# Patient Record
Sex: Male | Born: 1965 | Race: White | Hispanic: Yes | Marital: Single | Smoking: Light tobacco smoker
Health system: Southern US, Community
[De-identification: ages and names within clinical notes are randomized; demographics above are authoritative.]

## PROBLEM LIST (undated history)

## (undated) DIAGNOSIS — B20 Human immunodeficiency virus [HIV] disease: Secondary | ICD-10-CM

## (undated) DIAGNOSIS — J342 Deviated nasal septum: Secondary | ICD-10-CM

## (undated) DIAGNOSIS — E119 Type 2 diabetes mellitus without complications: Secondary | ICD-10-CM

## (undated) DIAGNOSIS — H269 Unspecified cataract: Secondary | ICD-10-CM

## (undated) HISTORY — PX: NASAL SEPTUM SURGERY: SHX37

## (undated) HISTORY — DX: Deviated nasal septum: J34.2

## (undated) HISTORY — DX: Unspecified cataract: H26.9

## (undated) HISTORY — DX: Human immunodeficiency virus (HIV) disease: B20

---

## 2014-05-15 ENCOUNTER — Telehealth: Payer: Self-pay

## 2014-05-15 NOTE — Telephone Encounter (Signed)
[facility] contacted regarding new intake appointment. Date and time given.  Patient is homeless.  Contact  Number is [facility], Luster LandsbergRenee is Case Production designer, theatre/television/filmManager.  Information given regarding documents needed to qualify for financial eligibility.  Laurell Josephsammy K Chanae Gemma, RN

## 2014-05-30 ENCOUNTER — Ambulatory Visit (INDEPENDENT_AMBULATORY_CARE_PROVIDER_SITE_OTHER): Payer: Self-pay | Admitting: Licensed Clinical Social Worker

## 2014-05-30 ENCOUNTER — Encounter: Payer: Self-pay | Admitting: *Deleted

## 2014-05-30 ENCOUNTER — Ambulatory Visit: Payer: Self-pay

## 2014-05-30 DIAGNOSIS — B20 Human immunodeficiency virus [HIV] disease: Secondary | ICD-10-CM

## 2014-05-30 DIAGNOSIS — Z23 Encounter for immunization: Secondary | ICD-10-CM

## 2014-05-30 LAB — CBC WITH DIFFERENTIAL/PLATELET
BASOS ABS: 0 10*3/uL (ref 0.0–0.1)
BASOS PCT: 0 % (ref 0–1)
EOS ABS: 0.3 10*3/uL (ref 0.0–0.7)
Eosinophils Relative: 5 % (ref 0–5)
HEMATOCRIT: 34.6 % — AB (ref 39.0–52.0)
HEMOGLOBIN: 12.1 g/dL — AB (ref 13.0–17.0)
LYMPHS PCT: 26 % (ref 12–46)
Lymphs Abs: 1.4 10*3/uL (ref 0.7–4.0)
MCH: 31.5 pg (ref 26.0–34.0)
MCHC: 35 g/dL (ref 30.0–36.0)
MCV: 90.1 fL (ref 78.0–100.0)
MONOS PCT: 16 % — AB (ref 3–12)
MPV: 9.7 fL (ref 8.6–12.4)
Monocytes Absolute: 0.9 10*3/uL (ref 0.1–1.0)
NEUTROS PCT: 53 % (ref 43–77)
Neutro Abs: 2.9 10*3/uL (ref 1.7–7.7)
PLATELETS: 260 10*3/uL (ref 150–400)
RBC: 3.84 MIL/uL — AB (ref 4.22–5.81)
RDW: 13 % (ref 11.5–15.5)
WBC: 5.4 10*3/uL (ref 4.0–10.5)

## 2014-05-30 LAB — COMPLETE METABOLIC PANEL WITH GFR
ALBUMIN: 4 g/dL (ref 3.5–5.2)
ALK PHOS: 51 U/L (ref 39–117)
ALT: 16 U/L (ref 0–53)
AST: 17 U/L (ref 0–37)
BUN: 16 mg/dL (ref 6–23)
CALCIUM: 9.1 mg/dL (ref 8.4–10.5)
CO2: 27 mEq/L (ref 19–32)
CREATININE: 0.78 mg/dL (ref 0.50–1.35)
Chloride: 102 mEq/L (ref 96–112)
GFR, Est African American: >89 mL/min
GFR, Est Non African American: >89 mL/min
Glucose, Bld: 93 mg/dL (ref 70–99)
POTASSIUM: 4.3 meq/L (ref 3.5–5.3)
Sodium: 138 mEq/L (ref 135–145)
Total Bilirubin: 0.3 mg/dL (ref 0.2–1.2)
Total Protein: 7.9 g/dL (ref 6.0–8.3)

## 2014-05-30 LAB — LIPID PANEL
CHOLESTEROL: 148 mg/dL (ref 0–200)
HDL: 34 mg/dL — AB (ref >39)
LDL Cholesterol: 87 mg/dL (ref 0–99)
TRIGLYCERIDES: 136 mg/dL (ref <150)
Total CHOL/HDL Ratio: 4.4 Ratio
VLDL: 27 mg/dL (ref 0–40)

## 2014-05-30 LAB — URINALYSIS
Bilirubin Urine: NEGATIVE
GLUCOSE, UA: NEGATIVE mg/dL
HGB URINE DIPSTICK: NEGATIVE
Ketones, ur: NEGATIVE mg/dL
Leukocytes, UA: NEGATIVE
NITRITE: NEGATIVE
PROTEIN: NEGATIVE mg/dL
Specific Gravity, Urine: 1.007 (ref 1.005–1.030)
UROBILINOGEN UA: 1 mg/dL (ref 0.0–1.0)
pH: 7 (ref 5.0–8.0)

## 2014-05-30 LAB — RPR

## 2014-05-30 LAB — HEPATITIS C ANTIBODY: HCV AB: NEGATIVE

## 2014-05-30 NOTE — Progress Notes (Signed)
New patient here today for HIV intake, he transferred from [facility] in [place]. He was diagnosed in 2008 after being hospitalized for Pneumonia. He currently is living in a homeless shelter and actively working with [facility]. Patient has been off Tivicay and Truvada since June of this year due to financial inability to obtain medications. His last visit with his ID provider was in 09/2013 CD4 155 and viral load 70. Patient states he is feeling well today, no current symptoms. Records received and vaccines updated.

## 2014-05-31 LAB — T-HELPER CELL (CD4) - (RCID CLINIC ONLY)
CD4 % Helper T Cell: 5 % — ABNORMAL LOW (ref 33–55)
CD4 T Cell Abs: 70 /uL — ABNORMAL LOW (ref 400–2700)

## 2014-05-31 LAB — URINE CYTOLOGY ANCILLARY ONLY
Chlamydia: NEGATIVE
Neisseria Gonorrhea: NEGATIVE

## 2014-06-01 LAB — HIV-1 RNA ULTRAQUANT REFLEX TO GENTYP+
HIV 1 RNA Quant: 174597 copies/mL — ABNORMAL HIGH (ref <20)
HIV-1 RNA Quant, Log: 5.24 {Log} — ABNORMAL HIGH (ref <1.30)

## 2014-06-01 LAB — QUANTIFERON TB GOLD ASSAY (BLOOD)
INTERFERON GAMMA RELEASE ASSAY: NEGATIVE
Mitogen value: >10 IU/mL
Quantiferon Nil Value: 0.05 IU/mL
Quantiferon Tb Ag Minus Nil Value: 0.01 IU/mL
TB AG VALUE: 0.06 [IU]/mL

## 2014-06-06 ENCOUNTER — Other Ambulatory Visit: Payer: Self-pay | Admitting: *Deleted

## 2014-06-06 DIAGNOSIS — B20 Human immunodeficiency virus [HIV] disease: Secondary | ICD-10-CM

## 2014-06-06 MED ORDER — EMTRICITABINE-TENOFOVIR DF 200-300 MG PO TABS: 1.0000 | ORAL_TABLET | Freq: Every day | ORAL | Status: DC

## 2014-06-06 MED ORDER — DOLUTEGRAVIR SODIUM 50 MG PO TABS: 50.0000 mg | ORAL_TABLET | Freq: Every day | ORAL | Status: DC

## 2014-06-14 LAB — HIV-1 GENOTYPR PLUS

## 2014-06-20 ENCOUNTER — Encounter: Payer: Self-pay | Admitting: Infectious Diseases

## 2014-06-20 ENCOUNTER — Telehealth: Payer: Self-pay | Admitting: *Deleted

## 2014-06-20 ENCOUNTER — Ambulatory Visit (INDEPENDENT_AMBULATORY_CARE_PROVIDER_SITE_OTHER): Payer: Self-pay | Admitting: Infectious Diseases

## 2014-06-20 VITALS — BP 127/78 | HR 81 | Temp 97.7°F | Ht 68.0 in | Wt 226.0 lb

## 2014-06-20 DIAGNOSIS — B20 Human immunodeficiency virus [HIV] disease: Secondary | ICD-10-CM

## 2014-06-20 DIAGNOSIS — E1165 Type 2 diabetes mellitus with hyperglycemia: Secondary | ICD-10-CM

## 2014-06-20 DIAGNOSIS — IMO0002 Reserved for concepts with insufficient information to code with codable children: Secondary | ICD-10-CM

## 2014-06-20 DIAGNOSIS — Z113 Encounter for screening for infections with a predominantly sexual mode of transmission: Secondary | ICD-10-CM

## 2014-06-20 MED ORDER — SULFAMETHOXAZOLE-TRIMETHOPRIM 800-160 MG PO TABS: 1.0000 | ORAL_TABLET | ORAL | Status: DC

## 2014-06-20 MED ORDER — ABACAVIR-DOLUTEGRAVIR-LAMIVUD 600-50-300 MG PO TABS: 1.0000 | ORAL_TABLET | Freq: Every day | ORAL | Status: DC

## 2014-06-20 NOTE — Assessment & Plan Note (Signed)
Will change him to triumeq. Will start him on bactrim tiw while his CD4 comes back up (was > 500 07-2013). Offered/refused condoms. Will see him back in 2-3 months.  Will get him in with [facility].

## 2014-06-20 NOTE — Assessment & Plan Note (Signed)
Will get him into [facility].

## 2014-06-20 NOTE — Progress Notes (Signed)
   Subjective:    Patient ID: Brett Walsh, male    DOB: [birth year], 49 y.o.   MRN: [id number]  HPI 49 yo M with hx of HIV+ since 2008 (when he was hospitalized with pneumonia), on DTGV/TRV (was on atripla prior, developed resistance), also hx of DM2 (2012).  Has been off insulin/glycemic/htn-sive meds and ART recently. [facility]   HIV 1 RNA QUANT (copies/mL)  Date Value  05/30/2014 174597*   CD4 T CELL ABS (/uL)  Date Value  05/30/2014 70*   Has been feeling well.   Review of Systems  Constitutional: Negative for fever, chills, appetite change and unexpected weight change.  Eyes: Negative for visual disturbance.  Gastrointestinal: Negative for diarrhea and constipation.  Genitourinary: Negative for difficulty urinating.  Neurological: Negative for numbness.       Objective:   Physical Exam  Constitutional: He appears well-developed and well-nourished.  HENT:  Mouth/Throat: No oropharyngeal exudate.  Eyes: EOM are normal. Pupils are equal, round, and reactive to light.  Neck: Neck supple.  Cardiovascular: Normal rate, regular rhythm and normal heart sounds.   Pulmonary/Chest: Effort normal and breath sounds normal.  Abdominal: Soft. Bowel sounds are normal. There is no tenderness. There is no rebound.  Musculoskeletal: He exhibits no edema.  No diabetic foot lesions. Normal light touch.   Lymphadenopathy:    He has no cervical adenopathy.          Assessment & Plan:

## 2014-06-20 NOTE — Telephone Encounter (Signed)
Andree CossHowell, Tadan Shill M, RN

## 2014-06-21 NOTE — Telephone Encounter (Signed)
Spoke with Luster Landsbergenee, [facility], regarding message left 1/19 about patient.  Patient's application for ADAP is not yet mailed (she will get his signature, will ask for this to be rushed due to his CD4 = 70).  She [facility].  Will look into financial assistance for prophylaxis bactrim. Andree CossHowell, Catia Todorov M, RN

## 2014-07-10 ENCOUNTER — Ambulatory Visit (INDEPENDENT_AMBULATORY_CARE_PROVIDER_SITE_OTHER): Payer: Self-pay | Admitting: Infectious Diseases

## 2014-07-10 ENCOUNTER — Encounter: Payer: Self-pay | Admitting: Infectious Diseases

## 2014-07-10 VITALS — BP 144/89 | HR 97 | Temp 98.1°F | Wt 227.0 lb

## 2014-07-10 DIAGNOSIS — B370 Candidal stomatitis: Secondary | ICD-10-CM | POA: Insufficient documentation

## 2014-07-10 DIAGNOSIS — B20 Human immunodeficiency virus [HIV] disease: Secondary | ICD-10-CM

## 2014-07-10 MED ORDER — FLUCONAZOLE 100 MG PO TABS: 100.0000 mg | ORAL_TABLET | Freq: Every day | ORAL | Status: DC

## 2014-07-10 MED ORDER — SULFAMETHOXAZOLE-TRIMETHOPRIM 800-160 MG PO TABS: 1.0000 | ORAL_TABLET | ORAL | Status: DC

## 2014-07-10 NOTE — Assessment & Plan Note (Signed)
He's at high risk given his DM, HIV.  Will write him rx for diflucan, my great appreciation to [facility] for their assistance in helping him get his meds.

## 2014-07-10 NOTE — Addendum Note (Signed)
Addended by: Andree CossHOWELL, Briel Gallicchio M on: 07/10/2014 03:34 PM   Modules accepted: Orders

## 2014-07-10 NOTE — Progress Notes (Signed)
   Subjective:    Patient ID: Brett MarionEverardo Walsh, male    DOB: 1966/02/28, 49 y.o.   MRN: [id number]  HPI 49 yo M with hx of HIV+ since 2008 (when he was hospitalized with pneumonia), on DTGV/TRV (was on atripla prior, developed resistance), also hx of DM2 (2012).  Has been off insulin/glycemic/htn-sive meds and ART recently He was seen in f/u in ID clinic on 1-19 and started on triumeq and bactrim.  Has developed white areas on his mouth and also a white substance on his groin/foreskin. Has had dysphagia. Has not gotten his ART yet, getting help from [facility].    Review of Systems     Objective:   Physical Exam  Constitutional: He appears well-developed and well-nourished.  HENT:  Mouth/Throat: No oropharyngeal exudate.  Eyes: EOM are normal. Pupils are equal, round, and reactive to light.  Neck: Neck supple.  Cardiovascular: Normal rate, regular rhythm and normal heart sounds.   Pulmonary/Chest: Effort normal and breath sounds normal.  Abdominal: Soft. Bowel sounds are normal. He exhibits no distension. There is no tenderness.  Lymphadenopathy:    He has no cervical adenopathy.          Assessment & Plan:

## 2014-07-10 NOTE — Assessment & Plan Note (Addendum)
My great appreciation to [facility] for their assistance in helping him get his meds.  He denies that he is depressed, has had some down time. Overall looking up, has had 3 job interviews.

## 2014-07-20 ENCOUNTER — Telehealth: Payer: Self-pay | Admitting: *Deleted

## 2014-07-20 NOTE — Telephone Encounter (Signed)
Patient came in with his lab work that was drawn at work and had questions. Explained to him about the different cholesterol levels and advised him that his glucose was slightly elevated at 110. Dr. Ninetta LightsHatcher does want him to establish with PCP and gave [facility]. A copy of his labs was put in Dr. Moshe CiproHatcher's box.

## 2014-08-10 ENCOUNTER — Telehealth: Payer: Self-pay | Admitting: *Deleted

## 2014-08-10 NOTE — Telephone Encounter (Signed)
Provided information for pt with constipation, ie., increase daily fluid intake, increase roughage in diet (pt could name products), and discuss stool softeners with pharmacist at pharmacy.  Pt verbalized understanding.

## 2014-08-15 ENCOUNTER — Telehealth: Payer: Self-pay | Admitting: Licensed Clinical Social Worker

## 2014-08-15 NOTE — Telephone Encounter (Signed)
Patient has concerns of a blotchy red rash on his arms and hands, it does not appear on the covered parts of his body.  Patient was wondering if it could come from his medication Triumeq. Spoke with Dr. Orvan Falconerampbell it is unlikely that it is coming from that. Patient called back stating that it could be sun sensitivity  from Bactrim that he takes daily. Patient has an appointment on tomorrow, Micael HampshireMichelle Howell, RN advised patient  sun protection on the skin and discuss with Dr. Ninetta LightsHatcher tomorrow.

## 2014-08-16 ENCOUNTER — Ambulatory Visit (INDEPENDENT_AMBULATORY_CARE_PROVIDER_SITE_OTHER): Payer: Self-pay | Admitting: Infectious Diseases

## 2014-08-16 ENCOUNTER — Encounter: Payer: Self-pay | Admitting: Infectious Diseases

## 2014-08-16 VITALS — BP 137/83 | HR 78 | Temp 98.0°F | Wt 230.5 lb

## 2014-08-16 DIAGNOSIS — K5909 Other constipation: Secondary | ICD-10-CM

## 2014-08-16 DIAGNOSIS — K5901 Slow transit constipation: Secondary | ICD-10-CM | POA: Insufficient documentation

## 2014-08-16 DIAGNOSIS — E1165 Type 2 diabetes mellitus with hyperglycemia: Secondary | ICD-10-CM

## 2014-08-16 DIAGNOSIS — IMO0002 Reserved for concepts with insufficient information to code with codable children: Secondary | ICD-10-CM

## 2014-08-16 DIAGNOSIS — B20 Human immunodeficiency virus [HIV] disease: Secondary | ICD-10-CM

## 2014-08-16 NOTE — Addendum Note (Signed)
Addended by: Jennet MaduroESTRIDGE, Dealie Koelzer D on: 08/16/2014 10:25 AM   Modules accepted: Orders

## 2014-08-16 NOTE — Assessment & Plan Note (Signed)
Will get him in with IM clinic.  No vision change.

## 2014-08-16 NOTE — Progress Notes (Signed)
   Subjective:    Patient ID: Brett MarionEverardo Walsh, male    DOB: May 07, 1966, 49 y.o.   MRN: [id number]  HPI 49 yo M with hx of HIV+ since 2008 (when he was hospitalized with pneumonia), on DTGV/TRV (was on atripla prior, developed resistance), also hx of DM2 (2012).  Has been off insulin/glycemic/htn-sive meds and ART recently He was seen in f/u in ID clinic on 1-19 and started on triumeq and bactrim.  Has been on triumeq for 2 weeks.  Has been having skin reaction to sun over the past week.  Does not have DM f/u yet.   HIV 1 RNA QUANT (copies/mL)  Date Value  05/30/2014 174597*   CD4 T CELL ABS (/uL)  Date Value  05/30/2014 70*   hsa tried stool softeners for constipation, has had incomplete relief.  Wt increasing  Review of Systems  Constitutional: Negative for unexpected weight change.  Cardiovascular: Negative for leg swelling.  Gastrointestinal: Positive for constipation. Negative for diarrhea.  Genitourinary: Negative for difficulty urinating.       Objective:   Physical Exam  Constitutional: He appears well-developed and well-nourished.  HENT:  Mouth/Throat: No oropharyngeal exudate.  Eyes: EOM are normal. Pupils are equal, round, and reactive to light.  Neck: Neck supple.  Cardiovascular: Normal rate, regular rhythm and normal heart sounds.   Pulmonary/Chest: Effort normal and breath sounds normal.  Abdominal: Soft. Bowel sounds are normal. He exhibits no distension. There is no tenderness.  Musculoskeletal: He exhibits no edema.  Lymphadenopathy:    He has no cervical adenopathy.       Assessment & Plan:

## 2014-08-16 NOTE — Assessment & Plan Note (Signed)
Suggested he try MgCitrate qday x 2.  Continue stool softener.

## 2014-08-16 NOTE — Assessment & Plan Note (Signed)
He appears to be doing well.  Sun reaction due to bactrim. Hopefully can stop this in the next month.  Offered/refused condoms.  rtc 1 month.  vax up to date.

## 2014-08-29 ENCOUNTER — Encounter: Payer: Self-pay | Admitting: Infectious Diseases

## 2014-08-31 ENCOUNTER — Encounter: Payer: Self-pay | Admitting: Infectious Diseases

## 2014-09-04 ENCOUNTER — Encounter: Payer: Self-pay | Admitting: Infectious Diseases

## 2014-09-09 ENCOUNTER — Encounter: Payer: Self-pay | Admitting: Infectious Diseases

## 2014-09-11 ENCOUNTER — Other Ambulatory Visit: Payer: 59

## 2014-09-11 DIAGNOSIS — B20 Human immunodeficiency virus [HIV] disease: Secondary | ICD-10-CM

## 2014-09-11 LAB — COMPLETE METABOLIC PANEL WITH GFR
ALBUMIN: 4.1 g/dL (ref 3.5–5.2)
ALT: 22 U/L (ref 0–53)
AST: 18 U/L (ref 0–37)
Alkaline Phosphatase: 44 U/L (ref 39–117)
BUN: 13 mg/dL (ref 6–23)
CHLORIDE: 102 meq/L (ref 96–112)
CO2: 23 mEq/L (ref 19–32)
Calcium: 8.8 mg/dL (ref 8.4–10.5)
Creat: 0.89 mg/dL (ref 0.50–1.35)
GFR, Est African American: >89 mL/min
GFR, Est Non African American: >89 mL/min
Glucose, Bld: 92 mg/dL (ref 70–99)
POTASSIUM: 3.8 meq/L (ref 3.5–5.3)
Sodium: 138 mEq/L (ref 135–145)
TOTAL PROTEIN: 8 g/dL (ref 6.0–8.3)
Total Bilirubin: 0.5 mg/dL (ref 0.2–1.2)

## 2014-09-11 LAB — CBC
HCT: 37 % — ABNORMAL LOW (ref 39.0–52.0)
Hemoglobin: 13 g/dL (ref 13.0–17.0)
MCH: 32.5 pg (ref 26.0–34.0)
MCHC: 35.1 g/dL (ref 30.0–36.0)
MCV: 92.5 fL (ref 78.0–100.0)
MPV: 9.9 fL (ref 8.6–12.4)
PLATELETS: 246 10*3/uL (ref 150–400)
RBC: 4 MIL/uL — ABNORMAL LOW (ref 4.22–5.81)
RDW: 16.8 % — ABNORMAL HIGH (ref 11.5–15.5)
WBC: 7.3 10*3/uL (ref 4.0–10.5)

## 2014-09-12 LAB — T-HELPER CELL (CD4) - (RCID CLINIC ONLY)
CD4 % Helper T Cell: 6 % — ABNORMAL LOW (ref 33–55)
CD4 T Cell Abs: 160 /uL — ABNORMAL LOW (ref 400–2700)

## 2014-09-12 LAB — HEPATITIS PANEL, ACUTE
HCV AB: NEGATIVE
Hep A IgM: NONREACTIVE
Hep B C IgM: NONREACTIVE
Hepatitis B Surface Ag: NEGATIVE

## 2014-09-12 LAB — HIV-1 RNA QUANT-NO REFLEX-BLD
HIV 1 RNA Quant: 117 copies/mL — ABNORMAL HIGH (ref <20)
HIV-1 RNA Quant, Log: 2.07 {Log} — ABNORMAL HIGH (ref <1.30)

## 2014-09-20 ENCOUNTER — Ambulatory Visit (INDEPENDENT_AMBULATORY_CARE_PROVIDER_SITE_OTHER): Payer: 59 | Admitting: Infectious Diseases

## 2014-09-20 ENCOUNTER — Encounter: Payer: Self-pay | Admitting: Infectious Diseases

## 2014-09-20 VITALS — BP 144/81 | HR 96 | Temp 97.9°F | Wt 236.0 lb

## 2014-09-20 DIAGNOSIS — B370 Candidal stomatitis: Secondary | ICD-10-CM | POA: Diagnosis not present

## 2014-09-20 DIAGNOSIS — IMO0002 Reserved for concepts with insufficient information to code with codable children: Secondary | ICD-10-CM

## 2014-09-20 DIAGNOSIS — B20 Human immunodeficiency virus [HIV] disease: Secondary | ICD-10-CM

## 2014-09-20 DIAGNOSIS — E1165 Type 2 diabetes mellitus with hyperglycemia: Secondary | ICD-10-CM | POA: Diagnosis not present

## 2014-09-20 DIAGNOSIS — Z113 Encounter for screening for infections with a predominantly sexual mode of transmission: Secondary | ICD-10-CM

## 2014-09-20 DIAGNOSIS — Z79899 Other long term (current) drug therapy: Secondary | ICD-10-CM

## 2014-09-20 MED ORDER — FLUCONAZOLE 100 MG PO TABS: 100.0000 mg | ORAL_TABLET | Freq: Every day | ORAL | Status: DC

## 2014-09-20 NOTE — Assessment & Plan Note (Signed)
He is doing well.  Will cont triumeq.  Needs Hep A/B. Offered/refused condoms.

## 2014-09-20 NOTE — Assessment & Plan Note (Signed)
Will refill his fluconazole.  

## 2014-09-20 NOTE — Progress Notes (Signed)
   Subjective:    Patient ID: Brett Walsh, male    DOB: Oct 11, 1965, 49 y.o.   MRN: [id number]  HPI 49 yo M with hx of HIV+ since 2008 (when he was hospitalized with pneumonia), on DTGV/TRV (was on atripla prior, developed resistance), also hx of DM2 (2012).  Has been off insulin/glycemic/htn-sive meds and ART recently He was seen in f/u in ID clinic on 1-19 and started on triumeq and bactrim.  He was then seen in f/u and was felt to be having sun reaction to bactrim.  Is worried he may have thrush again. Has had for last 5 days. Feels like he has a film/coating in his mouth.   HIV 1 RNA QUANT (copies/mL)  Date Value  09/11/2014 117*  05/30/2014 174597*   CD4 T CELL ABS (/uL)  Date Value  09/11/2014 160*  05/30/2014 70*   Has been walking fo exercise.  Chol 158, Trig 99 (07-2014)  Review of Systems  Constitutional: Negative for appetite change and unexpected weight change.  Gastrointestinal: Negative for diarrhea and constipation.  Genitourinary: Negative for difficulty urinating.      Objective:   Physical Exam  Constitutional: He appears well-developed and well-nourished.  HENT:  Mouth/Throat: No oropharyngeal exudate.  Eyes: EOM are normal. Pupils are equal, round, and reactive to light.  Neck: Neck supple.  Cardiovascular: Normal rate, regular rhythm and normal heart sounds.   Pulmonary/Chest: Effort normal and breath sounds normal.  Abdominal: Soft. Bowel sounds are normal. He exhibits no distension. There is no tenderness.  Lymphadenopathy:    He has no cervical adenopathy.      Assessment & Plan:

## 2014-09-20 NOTE — Assessment & Plan Note (Signed)
[facility] is [facility] get [facility].  Will keep trying.

## 2014-09-21 ENCOUNTER — Encounter: Payer: Self-pay | Admitting: Infectious Diseases

## 2014-10-20 ENCOUNTER — Ambulatory Visit (INDEPENDENT_AMBULATORY_CARE_PROVIDER_SITE_OTHER): Payer: 59 | Admitting: *Deleted

## 2014-10-20 DIAGNOSIS — Z23 Encounter for immunization: Secondary | ICD-10-CM

## 2014-10-20 DIAGNOSIS — B20 Human immunodeficiency virus [HIV] disease: Secondary | ICD-10-CM

## 2014-10-28 ENCOUNTER — Other Ambulatory Visit: Payer: Self-pay | Admitting: Infectious Diseases

## 2014-10-28 DIAGNOSIS — B20 Human immunodeficiency virus [HIV] disease: Secondary | ICD-10-CM

## 2014-12-11 ENCOUNTER — Other Ambulatory Visit: Payer: 59

## 2014-12-11 DIAGNOSIS — E1165 Type 2 diabetes mellitus with hyperglycemia: Secondary | ICD-10-CM

## 2014-12-11 DIAGNOSIS — Z113 Encounter for screening for infections with a predominantly sexual mode of transmission: Secondary | ICD-10-CM

## 2014-12-11 DIAGNOSIS — IMO0002 Reserved for concepts with insufficient information to code with codable children: Secondary | ICD-10-CM

## 2014-12-11 DIAGNOSIS — B20 Human immunodeficiency virus [HIV] disease: Secondary | ICD-10-CM

## 2014-12-11 LAB — BASIC METABOLIC PANEL
BUN: 17 mg/dL (ref 6–23)
CALCIUM: 9.1 mg/dL (ref 8.4–10.5)
CO2: 20 mEq/L (ref 19–32)
Chloride: 100 mEq/L (ref 96–112)
Creat: 1.05 mg/dL (ref 0.50–1.35)
Glucose, Bld: 196 mg/dL — ABNORMAL HIGH (ref 70–99)
Potassium: 4.2 mEq/L (ref 3.5–5.3)
Sodium: 134 mEq/L — ABNORMAL LOW (ref 135–145)

## 2014-12-11 LAB — LIPID PANEL
Cholesterol: 180 mg/dL (ref 0–200)
HDL: 34 mg/dL — ABNORMAL LOW (ref >=40)
LDL CALC: 119 mg/dL — AB (ref 0–99)
TRIGLYCERIDES: 136 mg/dL (ref <150)
Total CHOL/HDL Ratio: 5.3 Ratio
VLDL: 27 mg/dL (ref 0–40)

## 2014-12-11 LAB — HEMOGLOBIN A1C
Hgb A1c MFr Bld: 7.9 % — ABNORMAL HIGH (ref <5.7)
MEAN PLASMA GLUCOSE: 180 mg/dL — AB (ref <117)

## 2014-12-11 LAB — RPR

## 2014-12-12 LAB — T-HELPER CELL (CD4) - (RCID CLINIC ONLY)
CD4 T CELL HELPER: 8 % — AB (ref 33–55)
CD4 T Cell Abs: 280 /uL — ABNORMAL LOW (ref 400–2700)

## 2014-12-12 LAB — HIV-1 RNA QUANT-NO REFLEX-BLD: HIV-1 RNA Quant, Log: <1.3 {Log} (ref <1.30)

## 2014-12-25 ENCOUNTER — Telehealth: Payer: Self-pay | Admitting: *Deleted

## 2014-12-25 ENCOUNTER — Encounter: Payer: Self-pay | Admitting: Infectious Diseases

## 2014-12-25 ENCOUNTER — Ambulatory Visit (INDEPENDENT_AMBULATORY_CARE_PROVIDER_SITE_OTHER): Payer: 59 | Admitting: Infectious Diseases

## 2014-12-25 VITALS — BP 138/83 | HR 72 | Temp 97.9°F | Ht 67.0 in | Wt 235.0 lb

## 2014-12-25 DIAGNOSIS — IMO0002 Reserved for concepts with insufficient information to code with codable children: Secondary | ICD-10-CM

## 2014-12-25 DIAGNOSIS — Z79899 Other long term (current) drug therapy: Secondary | ICD-10-CM | POA: Diagnosis not present

## 2014-12-25 DIAGNOSIS — Z113 Encounter for screening for infections with a predominantly sexual mode of transmission: Secondary | ICD-10-CM

## 2014-12-25 DIAGNOSIS — B20 Human immunodeficiency virus [HIV] disease: Secondary | ICD-10-CM | POA: Diagnosis not present

## 2014-12-25 DIAGNOSIS — E1165 Type 2 diabetes mellitus with hyperglycemia: Secondary | ICD-10-CM | POA: Diagnosis not present

## 2014-12-25 NOTE — Assessment & Plan Note (Addendum)
He is doing well on his current art.  Offered/refused condoms. Not sexually active. Will stop bactrim. Will see him back in 6 months.

## 2014-12-25 NOTE — Telephone Encounter (Signed)
RN requested referral to [facility] [phone]) for PCP care.  Their referral coordinator, Annice Pih, will review this and contact the patient. RN contacted Dr. Ephriam Knuckles office [phone]), he is scheduled for 9/6 8:00 new patient appointment (pt should be off probation at work by this appointment, will be able to take off of work).  Referral faxed to [phone].   RN left message with this information on the patient's voice mail. Andree Coss, RN

## 2014-12-25 NOTE — Progress Notes (Addendum)
Patient ID: Brett Walsh, male   DOB: 02/03/1966, 49 y.o.   MRN: [id number] [facility] for referral.  This will be passed to [facility], their referral coordinator for new patients.  No need to send EPIC referral. Called [facility], Dr. Fawn Kirk for ophthalmology referral.  He is scheduled for Tuesday September 6 at 8:00.  Patient notified, referral information faxed. Andree Coss, RN

## 2014-12-25 NOTE — Assessment & Plan Note (Signed)
Will get him in with PCP, ophtho.

## 2014-12-25 NOTE — Progress Notes (Signed)
   Subjective:    Patient ID: Brett Walsh, male    DOB: 03/02/1966, 49 y.o.   MRN: [id number]  HPI 49 yo M with hx of HIV+ since 2008 (when he was hospitalized with pneumonia), on DTGV/TRV (was on atripla prior, developed resistance), also hx of DM2 (2012).  He was seen in f/u in ID clinic on 1-19 and started on triumeq and bactrim.  He was then seen in f/u and was felt to be having sun reaction to bactrim.  Has had difficulty finding f/u for his DM.  Has had no problems with triumeq.   HIV 1 RNA QUANT (copies/mL)  Date Value  12/11/2014 <20  09/11/2014 117*  05/30/2014 174597*   CD4 T CELL ABS (/uL)  Date Value  12/11/2014 280*  09/11/2014 160*  05/30/2014 70*   No ophtho this yr. Aware he needs to lose wt.    Review of Systems  Constitutional: Negative for appetite change and unexpected weight change.  Eyes: Negative for visual disturbance.  Gastrointestinal: Negative for diarrhea and constipation.  Genitourinary: Negative for difficulty urinating.  Neurological: Negative for numbness.       Objective:   Physical Exam  Constitutional: He appears well-developed and well-nourished.  HENT:  Mouth/Throat: No oropharyngeal exudate.  Eyes: EOM are normal. Pupils are equal, round, and reactive to light.  Neck: Neck supple.  Cardiovascular: Normal rate, regular rhythm and normal heart sounds.   Pulmonary/Chest: Effort normal and breath sounds normal.  Abdominal: Soft. Bowel sounds are normal. There is no tenderness. There is no rebound.  Musculoskeletal:       Feet:  Lymphadenopathy:    He has no cervical adenopathy.      Assessment & Plan:

## 2014-12-25 NOTE — Addendum Note (Signed)
Addended by: Andree Coss on: 12/25/2014 01:00 PM   Modules accepted: Medications

## 2015-01-09 ENCOUNTER — Encounter: Payer: Self-pay | Admitting: Infectious Diseases

## 2015-01-22 ENCOUNTER — Encounter: Payer: Self-pay | Admitting: Infectious Diseases

## 2015-01-22 ENCOUNTER — Telehealth: Payer: Self-pay | Admitting: *Deleted

## 2015-01-22 NOTE — Telephone Encounter (Signed)
The patient called and advised he is having trouble with his genatils and wants to get a refill on his medications for it. Asked him what medication and he could not tell me. Advised him because we can not diagnose this over the phone and he could not tell me what medication he used he will need an appt. Advised there is nothing oh his active med list and it could be something different. Gave him an appt with Dr Drue Second for 01/23/15 at 10 am.

## 2015-01-23 ENCOUNTER — Encounter: Payer: Self-pay | Admitting: Internal Medicine

## 2015-01-23 ENCOUNTER — Ambulatory Visit (INDEPENDENT_AMBULATORY_CARE_PROVIDER_SITE_OTHER): Payer: 59 | Admitting: Internal Medicine

## 2015-01-23 VITALS — BP 142/88 | HR 68 | Temp 97.7°F | Wt 231.0 lb

## 2015-01-23 DIAGNOSIS — B20 Human immunodeficiency virus [HIV] disease: Secondary | ICD-10-CM | POA: Diagnosis not present

## 2015-01-23 DIAGNOSIS — B3742 Candidal balanitis: Secondary | ICD-10-CM | POA: Diagnosis not present

## 2015-01-23 DIAGNOSIS — E1165 Type 2 diabetes mellitus with hyperglycemia: Secondary | ICD-10-CM | POA: Diagnosis not present

## 2015-01-23 DIAGNOSIS — IMO0002 Reserved for concepts with insufficient information to code with codable children: Secondary | ICD-10-CM

## 2015-01-23 MED ORDER — MICONAZOLE NITRATE 2 % EX CREA: 1.0000 "application " | TOPICAL_CREAM | Freq: Two times a day (BID) | CUTANEOUS | Status: DC

## 2015-01-23 NOTE — Progress Notes (Signed)
Patient ID: Brett Walsh, male   DOB: [birth year], 49 y.o.   MRN: [id number]       Patient ID: Brett Walsh, male   DOB: January 06, 1966, 49 y.o.   MRN: [id number]  HPI 49yo M with HIV disease, CD 4 count of 280/VL<20 on triomeq. He also has diabetes under good control with metformin and canagliflozin with hemoglobin a1c of 7. He noticed whitish exudate on head of penis. Also has fissure on shaft. No sex or masturbation. Started 7 days ago worsened over the weekend. Has not tried any topical medicaoitn  Outpatient Encounter Prescriptions as of 01/23/2015  Medication Sig  . Abacavir-Dolutegravir-Lamivud 600-50-300 MG TABS Take 1 tablet by mouth daily.  . cetirizine (ZYRTEC) 10 MG tablet Take 10 mg by mouth daily.  . metFORMIN (GLUCOPHAGE) 500 MG tablet Take 500 mg by mouth daily.  . canagliflozin (INVOKANA) 100 MG TABS tablet Take 100 mg by mouth.   No facility-administered encounter medications on file as of 01/23/2015.     Patient Active Problem List   Diagnosis Date Noted  . HIV disease 06/20/2014  . Diabetes type 2, uncontrolled 06/20/2014     Health Maintenance Due  Topic Date Due  . FOOT EXAM  12/21/1977  . OPHTHALMOLOGY EXAM  12/21/1977  . URINE MICROALBUMIN  12/21/1977  . INFLUENZA VACCINE  01/01/2015     Review of Systems  Physical Exam   BP 142/88 mmHg  Pulse 68  Temp(Src) 97.7 F (36.5 C) (Oral)  Wt 231 lb (104.781 kg) Gen= a x o by 3 in nad gu  = whitish exudate to folds of skin of penis shaft/head of penis. No ulcers, no penile drainage  Lab Results  Component Value Date   CD4TCELL 8* 12/11/2014   Lab Results  Component Value Date   CD4TABS 280* 12/11/2014   CD4TABS 160* 09/11/2014   CD4TABS 70* 05/30/2014   Lab Results  Component Value Date   HIV1RNAQUANT <20 12/11/2014   No results found for: HEPBSAB No results found for: RPR  CBC Lab Results  Component Value Date   WBC 7.3 09/11/2014   RBC 4.00* 09/11/2014   HGB 13.0 09/11/2014   HCT 37.0*  09/11/2014   PLT 246 09/11/2014   MCV 92.5 09/11/2014   MCH 32.5 09/11/2014   MCHC 35.1 09/11/2014   RDW 16.8* 09/11/2014   LYMPHSABS 1.4 05/30/2014   MONOABS 0.9 05/30/2014   EOSABS 0.3 05/30/2014   BASOSABS 0.0 05/30/2014   BMET Lab Results  Component Value Date   NA 134* 12/11/2014   K 4.2 12/11/2014   CL 100 12/11/2014   CO2 20 12/11/2014   GLUCOSE 196* 12/11/2014   BUN 17 12/11/2014   CREATININE 1.05 12/11/2014   CALCIUM 9.1 12/11/2014   GFRNONAA >89 09/11/2014   GFRAA >89 09/11/2014     Assessment and Plan  hiv disease = continue on triumeq  Dm 2, uncontrolled = improving, appears hemoglobin a1c at 7. Continue with current regimen and follow up with pcp.  Candidal balantitis = miconazole 2% cream bid, if not improved can switch to fluconazole  po x 7 d

## 2015-02-06 LAB — HM DIABETES EYE EXAM

## 2015-06-12 ENCOUNTER — Encounter: Payer: Self-pay | Admitting: Infectious Diseases

## 2015-06-12 ENCOUNTER — Other Ambulatory Visit: Payer: 59

## 2015-06-12 DIAGNOSIS — Z79899 Other long term (current) drug therapy: Secondary | ICD-10-CM

## 2015-06-12 DIAGNOSIS — B20 Human immunodeficiency virus [HIV] disease: Secondary | ICD-10-CM

## 2015-06-12 DIAGNOSIS — Z113 Encounter for screening for infections with a predominantly sexual mode of transmission: Secondary | ICD-10-CM

## 2015-06-12 LAB — COMPREHENSIVE METABOLIC PANEL
ALK PHOS: 55 U/L (ref 40–115)
ALT: 7 U/L — AB (ref 9–46)
AST: 8 U/L — AB (ref 10–40)
Albumin: 4 g/dL (ref 3.6–5.1)
BILIRUBIN TOTAL: 0.4 mg/dL (ref 0.2–1.2)
BUN: 16 mg/dL (ref 7–25)
CO2: 21 mmol/L (ref 20–31)
CREATININE: 0.82 mg/dL (ref 0.60–1.35)
Calcium: 9.1 mg/dL (ref 8.6–10.3)
Chloride: 98 mmol/L (ref 98–110)
GLUCOSE: 511 mg/dL — AB (ref 65–99)
Potassium: 4.6 mmol/L (ref 3.5–5.3)
SODIUM: 134 mmol/L — AB (ref 135–146)
TOTAL PROTEIN: 7.3 g/dL (ref 6.1–8.1)

## 2015-06-12 LAB — CBC
HCT: 38.5 % — ABNORMAL LOW (ref 39.0–52.0)
Hemoglobin: 13.1 g/dL (ref 13.0–17.0)
MCH: 33 pg (ref 26.0–34.0)
MCHC: 34 g/dL (ref 30.0–36.0)
MCV: 97 fL (ref 78.0–100.0)
MPV: 10.6 fL (ref 8.6–12.4)
PLATELETS: 268 10*3/uL (ref 150–400)
RBC: 3.97 MIL/uL — AB (ref 4.22–5.81)
RDW: 13.7 % (ref 11.5–15.5)
WBC: 8.5 10*3/uL (ref 4.0–10.5)

## 2015-06-12 LAB — LIPID PANEL
CHOLESTEROL: 179 mg/dL (ref 125–200)
HDL: 38 mg/dL — ABNORMAL LOW (ref >=40)
LDL Cholesterol: 112 mg/dL (ref <130)
Total CHOL/HDL Ratio: 4.7 Ratio (ref <=5.0)
Triglycerides: 147 mg/dL (ref <150)
VLDL: 29 mg/dL (ref <30)

## 2015-06-12 NOTE — Addendum Note (Signed)
Addended byJimmy Picket: ABBITT, KATRINA F on: 06/12/2015 11:34 AM   Modules accepted: Orders

## 2015-06-13 ENCOUNTER — Telehealth: Payer: Self-pay | Admitting: *Deleted

## 2015-06-13 LAB — RPR

## 2015-06-13 LAB — URINE CYTOLOGY ANCILLARY ONLY
Chlamydia: NEGATIVE
NEISSERIA GONORRHEA: NEGATIVE

## 2015-06-13 LAB — T-HELPER CELL (CD4) - (RCID CLINIC ONLY)
CD4 T CELL HELPER: 7 % — AB (ref 33–55)
CD4 T Cell Abs: 150 /uL — ABNORMAL LOW (ref 400–2700)

## 2015-06-13 NOTE — Telephone Encounter (Signed)
-----   Message from Ginnie SmartJeffrey C Hatcher, MD sent at 06/13/2015  8:42 AM EST ----- Please call pt and have him see PCP ASAP  ----- Message -----    From: Lab in [facility] Interface    Sent: 06/12/2015   6:16 PM      To: Ginnie SmartJeffrey C Hatcher, MD

## 2015-06-13 NOTE — Telephone Encounter (Signed)
Left message for patient then contacted patient's PCP to make him aware. Faxed results to Dr. Quita SkyeMitchell's attention. Patient then called back, stated he feels fine, he was not fasting for the labs.  RN advise that the patient needed to see Dr. Clovis RileyMitchell for potential medication adjustment. He is contacting Dr. Quita SkyeMitchell's office today. Andree CossHowell, Jasun Gasparini M, RN

## 2015-06-14 LAB — HIV-1 RNA QUANT-NO REFLEX-BLD
HIV 1 RNA Quant: 27 copies/mL — ABNORMAL HIGH (ref <20)
HIV-1 RNA QUANT, LOG: 1.43 {Log_copies}/mL — AB (ref <1.30)

## 2015-06-26 ENCOUNTER — Ambulatory Visit: Payer: 59 | Admitting: Infectious Diseases

## 2015-07-11 ENCOUNTER — Ambulatory Visit: Payer: 59 | Admitting: Infectious Diseases

## 2015-07-12 ENCOUNTER — Encounter: Payer: Self-pay | Admitting: Infectious Diseases

## 2015-07-12 ENCOUNTER — Ambulatory Visit (INDEPENDENT_AMBULATORY_CARE_PROVIDER_SITE_OTHER): Payer: 59 | Admitting: Infectious Diseases

## 2015-07-12 VITALS — BP 123/78 | HR 87 | Temp 97.4°F | Wt 207.0 lb

## 2015-07-12 DIAGNOSIS — Z23 Encounter for immunization: Secondary | ICD-10-CM | POA: Diagnosis not present

## 2015-07-12 DIAGNOSIS — B20 Human immunodeficiency virus [HIV] disease: Secondary | ICD-10-CM | POA: Diagnosis not present

## 2015-07-12 NOTE — Assessment & Plan Note (Addendum)
Has been improving.  Consider adding ACE-I? Has had f/u with PCP.

## 2015-07-12 NOTE — Addendum Note (Signed)
Addended by: Wendall Mola A on: 07/12/2015 03:34 PM   Modules accepted: Orders

## 2015-07-12 NOTE — Assessment & Plan Note (Signed)
will continue on triumeq Offered/refused condoms.  Will complete hep A/B today Asked him to see foot doctor.  Will recheck his CD4 and VL today.  rtc in 6 months if better.

## 2015-07-12 NOTE — Progress Notes (Signed)
   Subjective:    Patient ID: Brett Walsh, male    DOB: Oct 05, 1965, 50 y.o.   MRN: [id number]  HPI 50 yo M with hx of HIV+ since 2008 (when he was hospitalized with pneumonia), on DTGV/TRV (was on atripla prior, developed resistance), also hx of DM2 (2012).  He was seen in f/u in ID clinic on 1-19 and started on triumeq. He has been changed from metformin to insulin recently. Has been having some headaches with this.   HIV 1 RNA QUANT (copies/mL)  Date Value  06/12/2015 27*  12/11/2014 <20  09/11/2014 117*   CD4 T CELL ABS (/uL)  Date Value  06/12/2015 150*  12/11/2014 280*  09/11/2014 160*   Having small BM daily. Feels like he has incomplete evacuation. Does not feel constipated. Drinks ~ 2 gal/day. Has tried Mag Citrate prior with good result.   Review of Systems  Constitutional: Negative for appetite change and unexpected weight change.  Eyes: Negative for visual disturbance.  Gastrointestinal: Positive for constipation. Negative for diarrhea.  Genitourinary: Negative for difficulty urinating.  Neurological: Positive for numbness.   Numbness in R hand from pinched nerve in neck.  Please see HPI. 12 point ROS o/w (-)     Objective:   Physical Exam  Constitutional: He appears well-developed and well-nourished.  HENT:  Mouth/Throat: No oropharyngeal exudate.  Eyes: EOM are normal. Pupils are equal, round, and reactive to light.  Neck: Neck supple.  Cardiovascular: Normal rate, regular rhythm and normal heart sounds.   Pulmonary/Chest: Effort normal and breath sounds normal.  Abdominal: Soft. Bowel sounds are normal. There is no tenderness. There is no rebound.  Musculoskeletal: He exhibits no edema.  No diabetic foot lesions. Normal light touch.   Lymphadenopathy:    He has no cervical adenopathy.      Assessment & Plan:

## 2015-07-13 ENCOUNTER — Other Ambulatory Visit: Payer: Self-pay | Admitting: Infectious Diseases

## 2015-07-13 LAB — T-HELPER CELL (CD4) - (RCID CLINIC ONLY)
CD4 T CELL HELPER: 10 % — AB (ref 33–55)
CD4 T Cell Abs: 210 /uL — ABNORMAL LOW (ref 400–2700)

## 2015-07-16 LAB — HIV-1 RNA QUANT-NO REFLEX-BLD

## 2015-08-14 ENCOUNTER — Encounter (HOSPITAL_COMMUNITY): Payer: Self-pay | Admitting: Emergency Medicine

## 2015-08-14 ENCOUNTER — Emergency Department (HOSPITAL_COMMUNITY)
Admission: EM | Admit: 2015-08-14 | Discharge: 2015-08-14 | Disposition: A | Discharge status: Home / Self Care | Payer: 59 | Source: Home / Self Care

## 2015-08-14 DIAGNOSIS — K529 Noninfective gastroenteritis and colitis, unspecified: Secondary | ICD-10-CM

## 2015-08-14 HISTORY — DX: Type 2 diabetes mellitus without complications: E11.9

## 2015-08-14 MED ORDER — ONDANSETRON HCL 4 MG PO TABS: 4.0000 mg | ORAL_TABLET | Freq: Four times a day (QID) | ORAL | Status: DC

## 2015-08-14 NOTE — Discharge Instructions (Signed)

## 2015-08-14 NOTE — ED Notes (Signed)
[facility] for [facility]. The patient denied any associated abdominal pain.

## 2015-10-12 ENCOUNTER — Other Ambulatory Visit: Payer: Self-pay | Admitting: Infectious Diseases

## 2016-01-07 ENCOUNTER — Other Ambulatory Visit (HOSPITAL_COMMUNITY)
Admission: RE | Admit: 2016-01-07 | Discharge: 2016-01-07 | Disposition: A | Discharge status: Home / Self Care | Payer: 59 | Source: Ambulatory Visit | Attending: Infectious Diseases | Admitting: Infectious Diseases

## 2016-01-07 ENCOUNTER — Other Ambulatory Visit: Payer: 59

## 2016-01-07 DIAGNOSIS — Z113 Encounter for screening for infections with a predominantly sexual mode of transmission: Secondary | ICD-10-CM | POA: Insufficient documentation

## 2016-01-07 DIAGNOSIS — B20 Human immunodeficiency virus [HIV] disease: Secondary | ICD-10-CM

## 2016-01-07 LAB — CBC
HCT: 39.1 % (ref 38.5–50.0)
Hemoglobin: 13.4 g/dL (ref 13.2–17.1)
MCH: 33.3 pg — AB (ref 27.0–33.0)
MCHC: 34.3 g/dL (ref 32.0–36.0)
MCV: 97.3 fL (ref 80.0–100.0)
MPV: 10.8 fL (ref 7.5–12.5)
Platelets: 288 10*3/uL (ref 140–400)
RBC: 4.02 MIL/uL — AB (ref 4.20–5.80)
RDW: 13.9 % (ref 11.0–15.0)
WBC: 11.6 10*3/uL — ABNORMAL HIGH (ref 3.8–10.8)

## 2016-01-07 LAB — COMPREHENSIVE METABOLIC PANEL
ALT: 13 U/L (ref 9–46)
AST: 10 U/L (ref 10–40)
Albumin: 3.9 g/dL (ref 3.6–5.1)
Alkaline Phosphatase: 67 U/L (ref 40–115)
BUN: 22 mg/dL (ref 7–25)
CALCIUM: 8.8 mg/dL (ref 8.6–10.3)
CHLORIDE: 102 mmol/L (ref 98–110)
CO2: 21 mmol/L (ref 20–31)
Creat: 0.84 mg/dL (ref 0.60–1.35)
GLUCOSE: 320 mg/dL — AB (ref 65–99)
POTASSIUM: 4.3 mmol/L (ref 3.5–5.3)
Sodium: 136 mmol/L (ref 135–146)
Total Bilirubin: 0.3 mg/dL (ref 0.2–1.2)
Total Protein: 7.3 g/dL (ref 6.1–8.1)

## 2016-01-08 LAB — URINE CYTOLOGY ANCILLARY ONLY
Chlamydia: NEGATIVE
Neisseria Gonorrhea: NEGATIVE

## 2016-01-08 LAB — HIV-1 RNA QUANT-NO REFLEX-BLD
HIV 1 RNA Quant: 48 copies/mL — ABNORMAL HIGH (ref <20)
HIV-1 RNA QUANT, LOG: 1.68 {Log_copies}/mL — AB (ref <1.30)

## 2016-01-08 LAB — RPR: RPR: REACTIVE — AB

## 2016-01-08 LAB — T-HELPER CELL (CD4) - (RCID CLINIC ONLY)
CD4 % Helper T Cell: 11 % — ABNORMAL LOW (ref 33–55)
CD4 T CELL ABS: 290 /uL — AB (ref 400–2700)

## 2016-01-08 LAB — FLUORESCENT TREPONEMAL AB(FTA)-IGG-BLD: Fluorescent Treponemal ABS: REACTIVE — AB

## 2016-01-08 LAB — RPR TITER: RPR Titer: 1:1 {titer}

## 2016-01-21 ENCOUNTER — Ambulatory Visit: Payer: 59 | Admitting: *Deleted

## 2016-01-21 ENCOUNTER — Encounter: Payer: Self-pay | Admitting: Infectious Diseases

## 2016-01-21 ENCOUNTER — Ambulatory Visit (INDEPENDENT_AMBULATORY_CARE_PROVIDER_SITE_OTHER): Payer: 59 | Admitting: Infectious Diseases

## 2016-01-21 VITALS — BP 152/95 | HR 88 | Temp 98.2°F | Ht 67.0 in | Wt 235.0 lb

## 2016-01-21 DIAGNOSIS — M26609 Unspecified temporomandibular joint disorder, unspecified side: Secondary | ICD-10-CM | POA: Diagnosis not present

## 2016-01-21 DIAGNOSIS — F32A Depression, unspecified: Secondary | ICD-10-CM

## 2016-01-21 DIAGNOSIS — B20 Human immunodeficiency virus [HIV] disease: Secondary | ICD-10-CM | POA: Diagnosis not present

## 2016-01-21 DIAGNOSIS — Z79899 Other long term (current) drug therapy: Secondary | ICD-10-CM

## 2016-01-21 DIAGNOSIS — Z23 Encounter for immunization: Secondary | ICD-10-CM | POA: Diagnosis not present

## 2016-01-21 DIAGNOSIS — E1165 Type 2 diabetes mellitus with hyperglycemia: Secondary | ICD-10-CM

## 2016-01-21 DIAGNOSIS — E1122 Type 2 diabetes mellitus with diabetic chronic kidney disease: Secondary | ICD-10-CM

## 2016-01-21 DIAGNOSIS — Z113 Encounter for screening for infections with a predominantly sexual mode of transmission: Secondary | ICD-10-CM

## 2016-01-21 DIAGNOSIS — N181 Chronic kidney disease, stage 1: Secondary | ICD-10-CM

## 2016-01-21 DIAGNOSIS — F329 Major depressive disorder, single episode, unspecified: Secondary | ICD-10-CM

## 2016-01-21 DIAGNOSIS — IMO0002 Reserved for concepts with insufficient information to code with codable children: Secondary | ICD-10-CM

## 2016-01-21 NOTE — Assessment & Plan Note (Signed)
Encouraged him to wear mouthgaurd.  Dental f/u

## 2016-01-21 NOTE — Progress Notes (Signed)
   Subjective:    Patient ID: Brett Walsh, male    DOB: 07-03-1965, 50 y.o.   MRN: [id number]  HPI 50 yo M with hx of HIV+ since 2008 (when he was hospitalized with pneumonia), on DTGV/TRV (was on atripla prior, developed resistance), also hx of DM2 (2012) on metformin.  He was seen in f/u in ID clinic on 1-19 and started on triumeq.  HIV 1 RNA Quant (copies/mL)  Date Value  01/07/2016 48 (H)  07/12/2015 <20  06/12/2015 27 (H)   CD4 T Cell Abs (/uL)  Date Value  01/07/2016 290 (L)  07/12/2015 210 (L)  06/12/2015 150 (L)   Today complains of severe pain and cramping that awakens him from sleep.  Has been having coughing as well.  On no anti-htn rx.  Has lost 7# in last week.  FSG have been undercontrol now.  Has prev TMJ, started using mouthgaurd again and headache  is better.  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   Review of Systems  Constitutional: Negative for appetite change and unexpected weight change.  Eyes: Negative for visual disturbance.  Cardiovascular: Negative for chest pain.  Gastrointestinal: Negative for constipation and diarrhea.  Genitourinary: Negative for difficulty urinating.  Neurological: Positive for headaches. Negative for numbness.  loose Bm post eating.      Objective:   Physical Exam  Constitutional: He appears well-developed and well-nourished.  HENT:  Mouth/Throat: No oropharyngeal exudate.  Eyes: EOM are normal. Pupils are equal, round, and reactive to light.  Neck: Neck supple.  Cardiovascular: Normal rate, regular rhythm and normal heart sounds.   Pulmonary/Chest: Effort normal and breath sounds normal.  Abdominal: Soft. Bowel sounds are normal. There is no tenderness. There is no rebound.  Lymphadenopathy:    He has no cervical adenopathy.  Skin: Skin is warm and dry.  Feet have intact skin, normal pulse, normal light light touch.       Assessment & Plan:

## 2016-01-21 NOTE — BH Specialist Note (Signed)
Counselor met with Brett Walsh today in the exam room for a warm hand off.  Patient was oriented times four with good affect and dress.  Patient shared with counselor that he had a lot going on mostly and he felt like he needed to talk to someone.  Patient indicated that he was not handling things well in his life and was probably drinking too much on top of it.  Counselor encouraged patient to make an appointment to further explore what is going on in his life and causing the disturbance.  Patient agreed and said he would make an appointment when he checked out today.   Rolena Infante, MA ,[facility]

## 2016-01-21 NOTE — Assessment & Plan Note (Signed)
Gets flu shot today.  Continue his current rx, mild blip.  Offered/refused condoms.  Suspect RPR is false +.  rtc in 6 months.

## 2016-01-21 NOTE — Assessment & Plan Note (Signed)
Encouraged that he is off insulin, back on metformin.  Will need better BP control.  Encouraged about wt loss.  F/u PCP

## 2016-02-08 ENCOUNTER — Encounter: Payer: Self-pay | Admitting: Infectious Diseases

## 2016-02-13 ENCOUNTER — Encounter: Payer: Self-pay | Admitting: Infectious Diseases

## 2016-05-01 ENCOUNTER — Encounter (HOSPITAL_COMMUNITY): Payer: Self-pay | Admitting: Vascular Surgery

## 2016-05-01 ENCOUNTER — Encounter (HOSPITAL_COMMUNITY): Payer: Self-pay | Admitting: Emergency Medicine

## 2016-05-01 ENCOUNTER — Emergency Department (HOSPITAL_COMMUNITY)
Admission: EM | Admit: 2016-05-01 | Discharge: 2016-05-01 | Disposition: A | Discharge status: Home / Self Care | Payer: 59 | Attending: Emergency Medicine | Admitting: Emergency Medicine

## 2016-05-01 ENCOUNTER — Ambulatory Visit (HOSPITAL_COMMUNITY)
Admission: EM | Admit: 2016-05-01 | Discharge: 2016-05-01 | Disposition: A | Discharge status: Nursing Home (Includes ICF) | Payer: 59 | Attending: Family Medicine | Admitting: Family Medicine

## 2016-05-01 DIAGNOSIS — F17210 Nicotine dependence, cigarettes, uncomplicated: Secondary | ICD-10-CM | POA: Insufficient documentation

## 2016-05-01 DIAGNOSIS — E1122 Type 2 diabetes mellitus with diabetic chronic kidney disease: Secondary | ICD-10-CM | POA: Diagnosis not present

## 2016-05-01 DIAGNOSIS — B20 Human immunodeficiency virus [HIV] disease: Secondary | ICD-10-CM | POA: Diagnosis not present

## 2016-05-01 DIAGNOSIS — R197 Diarrhea, unspecified: Secondary | ICD-10-CM

## 2016-05-01 DIAGNOSIS — R112 Nausea with vomiting, unspecified: Secondary | ICD-10-CM

## 2016-05-01 DIAGNOSIS — E1165 Type 2 diabetes mellitus with hyperglycemia: Secondary | ICD-10-CM | POA: Diagnosis not present

## 2016-05-01 DIAGNOSIS — E860 Dehydration: Secondary | ICD-10-CM

## 2016-05-01 DIAGNOSIS — IMO0002 Reserved for concepts with insufficient information to code with codable children: Secondary | ICD-10-CM

## 2016-05-01 DIAGNOSIS — N181 Chronic kidney disease, stage 1: Secondary | ICD-10-CM

## 2016-05-01 DIAGNOSIS — Z7984 Long term (current) use of oral hypoglycemic drugs: Secondary | ICD-10-CM | POA: Insufficient documentation

## 2016-05-01 LAB — BETA-HYDROXYBUTYRIC ACID: BETA-HYDROXYBUTYRIC ACID: 0.99 mmol/L — AB (ref 0.05–0.27)

## 2016-05-01 LAB — HEPATIC FUNCTION PANEL
ALT: 13 U/L — AB (ref 17–63)
AST: 19 U/L (ref 15–41)
Albumin: 3.8 g/dL (ref 3.5–5.0)
Alkaline Phosphatase: 70 U/L (ref 38–126)
BILIRUBIN TOTAL: 0.6 mg/dL (ref 0.3–1.2)
Total Protein: 7.7 g/dL (ref 6.5–8.1)

## 2016-05-01 LAB — CBC
HEMATOCRIT: 42.4 % (ref 39.0–52.0)
Hemoglobin: 15 g/dL (ref 13.0–17.0)
MCH: 33 pg (ref 26.0–34.0)
MCHC: 35.4 g/dL (ref 30.0–36.0)
MCV: 93.2 fL (ref 78.0–100.0)
Platelets: 286 10*3/uL (ref 150–400)
RBC: 4.55 MIL/uL (ref 4.22–5.81)
RDW: 12.4 % (ref 11.5–15.5)
WBC: 10 10*3/uL (ref 4.0–10.5)

## 2016-05-01 LAB — CBG MONITORING, ED
Glucose-Capillary: 368 mg/dL — ABNORMAL HIGH (ref 65–99)
Glucose-Capillary: 264 mg/dL — ABNORMAL HIGH (ref 65–99)

## 2016-05-01 LAB — BASIC METABOLIC PANEL
Anion gap: 12 (ref 5–15)
BUN: 14 mg/dL (ref 6–20)
CO2: 21 mmol/L — AB (ref 22–32)
Calcium: 9.4 mg/dL (ref 8.9–10.3)
Chloride: 103 mmol/L (ref 101–111)
Creatinine, Ser: 0.93 mg/dL (ref 0.61–1.24)
GFR calc Af Amer: >60 mL/min (ref >60)
GLUCOSE: 365 mg/dL — AB (ref 65–99)
POTASSIUM: 3.7 mmol/L (ref 3.5–5.1)
Sodium: 136 mmol/L (ref 135–145)

## 2016-05-01 LAB — I-STAT VENOUS BLOOD GAS, ED
Acid-base deficit: 2 mmol/L (ref 0.0–2.0)
BICARBONATE: 21.8 mmol/L (ref 20.0–28.0)
O2 Saturation: 62 %
TCO2: 23 mmol/L (ref 0–100)
pCO2, Ven: 34.2 mmHg — ABNORMAL LOW (ref 44.0–60.0)
pH, Ven: 7.412 (ref 7.250–7.430)
pO2, Ven: 32 mmHg (ref 32.0–45.0)

## 2016-05-01 LAB — GLUCOSE, CAPILLARY: GLUCOSE-CAPILLARY: 434 mg/dL — AB (ref 65–99)

## 2016-05-01 LAB — LIPASE, BLOOD: Lipase: 23 U/L (ref 11–51)

## 2016-05-01 MED ORDER — ONDANSETRON 4 MG PO TBDP: 4.0000 mg | ORAL_TABLET | Freq: Three times a day (TID) | ORAL | 0 refills | Status: DC | PRN

## 2016-05-01 MED ORDER — SODIUM CHLORIDE 0.9 % IV BOLUS (SEPSIS)
1000.0000 mL | Freq: Once | INTRAVENOUS | Status: AC
  Administered 2016-05-01: 1000 mL via INTRAVENOUS

## 2016-05-01 MED ORDER — ONDANSETRON HCL 4 MG/2ML IJ SOLN
4.0000 mg | Freq: Once | INTRAMUSCULAR | Status: AC
  Administered 2016-05-01: 4 mg via INTRAVENOUS
  Filled 2016-05-01: qty 2

## 2016-05-01 NOTE — Discharge Instructions (Signed)
Your blood sugar level is very high. You also appear slightly dehydrated as her heart rate is elevated. Sometimes, infection can cause your  blood sugar to go up. Other times, high blood sugar alone can cause vomiting and diarrhea.  The best thing for you to do is go to the emergency room for IV fluids and insulin.

## 2016-05-01 NOTE — ED Provider Notes (Signed)
CSN: [id number]     Arrival date & time 05/01/16  1320 History   First MD Initiated Contact with Patient 05/01/16 1347     Chief Complaint  Patient presents with  . Emesis  . Diarrhea   (Consider location/radiation/quality/duration/timing/severity/associated sxs/prior Treatment) HPI  This is a 50 year old male with a past history of HIV And diabetes presenting with diarrhea and vomiting for the last 5-6 days.  Patient notes explosive, watery diarrhea that started Friday night/Saturday morning. He notes he has 4-5 episodes of diarrhea per night. He is timid to eat during the day time, therefore has no diarrhea during the day.  He's had multiple episodes of vomiting, around 10 episodes over the last week. Emesis looks like food he has eaten.  He's vomited up his croissanwhich today. He has a poor appetite. He denies any hematochezia, melena, hematemesis, bilious emesis, abdominal pain.  He started having cold sweats 2 days ago. No fever.  Works at call center. He denies any recent travel. He denies camping/hiking. Uses [place] water, run through a filter. He is a man that has sex with men but denies any recent intercourse since 2015. He's been warned to drink things like Gatorade because he didn't want to worsen his diabetes. He has been on his HIV regimen for over one year now. Her port. He endorses fatigue and weakness andthat he has been unable to go to work this week.  No increased polyuria or polydipsia.    Past Medical History:  Diagnosis Date  . Diabetes mellitus without complication (HCC)   . HIV disease (HCC)    History reviewed. No pertinent surgical history. Family History  Problem Relation Age of Onset  . Diabetes Brother   . Diabetes Mother   . Diabetes Father   . Heart failure Father    Social History  Substance Use Topics  . Smoking status: Current Every Day Smoker    Packs/day: 0.50    Types: Cigarettes  . Smokeless tobacco: Never Used  . Alcohol use 0.6  oz/week    1 Standard drinks or equivalent per week     Comment: socially    Review of Systems  Constitutional: Positive for activity change, appetite change, chills, fatigue and fever. Negative for diaphoresis.  HENT: Negative for sore throat and trouble swallowing.   Eyes: Negative.   Respiratory: Negative for shortness of breath, wheezing and stridor.   Cardiovascular: Negative for chest pain.  Gastrointestinal: Positive for diarrhea, nausea and vomiting. Negative for abdominal pain, blood in stool and constipation.  Endocrine: Negative.  Negative for polydipsia, polyphagia and polyuria.  Genitourinary: Negative for decreased urine volume.  Musculoskeletal: Negative for joint swelling.  Skin: Negative for rash.  Allergic/Immunologic: Negative.   Neurological: Positive for weakness. Negative for dizziness and light-headedness.  Hematological: Negative.   Psychiatric/Behavioral: Negative.     Allergies  Patient has no known allergies.  Home Medications   Prior to Admission medications   Medication Sig Start Date End Date Taking? Authorizing Provider  loratadine (CLARITIN) 10 MG tablet Take 10 mg by mouth daily.    Historical Provider, MD  metFORMIN (GLUCOPHAGE) 500 MG tablet Take 500 mg by mouth daily. 500 mg in am, 1500 mg in pm    Historical Provider, MD  TRIUMEQ 600-50-300 MG tablet TAKE 1 TABLET BY MOUTH DAILY 10/12/15   Ginnie SmartJeffrey C Hatcher, MD   Meds Ordered and Administered this Visit  Medications - No data to display  BP 138/91 (BP Location: Right Arm)  Pulse 110   Temp 98.4 F (36.9 C)   Resp 18   SpO2 97%  No data found.   Physical Exam  Constitutional: He is oriented to person, place, and time. He appears well-developed and well-nourished. No distress.  HENT:  Moist mucous membranes  Eyes: Conjunctivae are normal. Right eye exhibits no discharge. Left eye exhibits no discharge. No scleral icterus.  Cardiovascular: Regular rhythm and normal heart sounds.    No murmur heard. Tachycardic  Pulmonary/Chest: Effort normal and breath sounds normal. No stridor. No respiratory distress. He has no wheezes. He has no rales.  Abdominal: Soft. Bowel sounds are normal. He exhibits no distension. There is no tenderness. There is no rebound and no guarding.  Musculoskeletal: Normal range of motion.  Neurological: He is alert and oriented to person, place, and time.  Skin: Skin is warm. He is not diaphoretic.  Mildly decreased skin turgor    Urgent Care Course   Clinical Course     Procedures (including critical care time)  Labs Review Labs Reviewed  GLUCOSE, CAPILLARY - Abnormal; Notable for the following:       Result Value   Glucose-Capillary 434 (*)    All other components within normal limits     1400: Given the patient's symptoms and history of uncontrolled diabetes, will check a CBG.   MDM   1. Dehydration   2. Nausea vomiting and diarrhea    This is a 50 year old male with a past history of HIV presenting with nausea, diarrhea, and vomiting noted to have hyperglycemia up to 434 on exam. His last CD4 count was 290, with a viral load of 48. Given the CD4 count, I'm less suspicious for opportunistic infection such as cryptosporidium, MAC, CMV. Given how long it's been since these had sex with men, this seems less likely Giardia. I'm concerned by the patient's hyperglycemia. Unsure if the hyperglycemia is secondary to an infectious process or if his hyperglycemia is causing his current nausea and vomiting (possibly a gastroparesis). He is also noted to be mildly tachycardic on exam with mildly decreased skin turgor. He is stable on exam. Advised the patient to go by private equal to the emergency room for further management with IV fluids and possible insulin. 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 When he saw infectious disease last, they initially felt that it was a false positive, but after  obtaining at [facility] advised the patient that he'll be a good idea for him to be treated for syphilis by the health department. She states he's been unable to get in contact with the health department and therefore has not been treated with penicillin.  The patient would benefit from being treated with penicillin in the emergency room as well. If he could produce a sample, he may benefit from stool samples as well.    Joanna Puffrystal S Dorsey, MD 05/01/16 [phone]

## 2016-05-01 NOTE — ED Triage Notes (Signed)
Pt reports to the ED for eval of hyperglycemia (CBG 434 mg/dl). Pt reports he has had N/V/D, fevers, and chills since Friday and [facility] and he was referred here d/t the hyperglycemia. Pt has been complaint with his medication. Pt has hx of DM.

## 2016-05-01 NOTE — ED Notes (Signed)
cbg was 368

## 2016-05-01 NOTE — ED Provider Notes (Signed)
[facility]-EMERGENCY DEPT Provider Note   CSN: [id number] Arrival date & time: 05/01/16  1747     History   Chief Complaint Chief Complaint  Patient presents with  . Hyperglycemia    HPI Brett Walsh is a 50 y.o. male.  Hyperglycemia  Blood sugar level PTA:  >400 Severity:  Moderate Onset quality:  Gradual Duration:  2 days Timing:  Constant Progression:  Worsening Chronicity:  New Diabetes status:  Controlled with oral medications Current diabetic therapy:  Metformin Context: recent illness   Relieved by:  Nothing Ineffective treatments:  None tried Associated symptoms: dehydration, fever, nausea and vomiting   Associated symptoms: no abdominal pain, no chest pain, no dysuria and no shortness of breath   Risk factors: hx of DKA     Past Medical History:  Diagnosis Date  . Diabetes mellitus without complication (HCC)   . HIV disease [facility])     Patient Active Problem List   Diagnosis Date Noted  . TMJ (temporomandibular joint syndrome) 01/21/2016  . HIV disease (HCC) 06/20/2014  . Diabetes type 2, uncontrolled (HCC) 06/20/2014    History reviewed. No pertinent surgical history.     Home Medications    Prior to Admission medications   Medication Sig Start Date End Date Taking? Authorizing Provider  loratadine (CLARITIN) 10 MG tablet Take 10 mg by mouth daily.    Historical Provider, MD  metFORMIN (GLUCOPHAGE) 500 MG tablet Take 500 mg by mouth daily. 500 mg in am, 1500 mg in pm    Historical Provider, MD  ondansetron (ZOFRAN ODT) 4 MG disintegrating tablet Take 1 tablet (4 mg total) by mouth every 8 (eight) hours as needed for nausea or vomiting. 05/01/16   Cherlynn Perches, MD  TRIUMEQ 600-50-300 MG tablet TAKE 1 TABLET BY MOUTH DAILY 10/12/15   Ginnie Smart, MD    Family History Family History  Problem Relation Age of Onset  . Diabetes Brother   . Diabetes Mother   . Diabetes Father   . Heart failure Father     Social History Social History    Substance Use Topics  . Smoking status: Current Every Day Smoker    Packs/day: 0.50    Types: Cigarettes  . Smokeless tobacco: Never Used  . Alcohol use 0.6 oz/week    1 Standard drinks or equivalent per week     Comment: socially     Allergies   Patient has no known allergies.   Review of Systems Review of Systems  Constitutional: Positive for fever. Negative for chills.  HENT: Negative for ear pain and sore throat.   Eyes: Negative for pain and visual disturbance.  Respiratory: Negative for cough and shortness of breath.   Cardiovascular: Negative for chest pain and palpitations.  Gastrointestinal: Positive for nausea and vomiting. Negative for abdominal pain, anal bleeding, blood in stool and constipation.  Genitourinary: Negative for dysuria and hematuria.  Musculoskeletal: Negative for arthralgias and back pain.  Skin: Negative for color change and rash.  Neurological: Negative for seizures and syncope.  All other systems reviewed and are negative.    Physical Exam Updated Vital Signs BP 126/77   Pulse 78   Temp 98.3 F (36.8 C) (Oral)   Resp 16   SpO2 100%   Physical Exam  Constitutional: He appears well-developed and well-nourished.  HENT:  Head: Normocephalic and atraumatic.  Eyes: Conjunctivae are normal. Pupils are equal, round, and reactive to light.  Neck: Neck supple.  Cardiovascular: Normal rate and regular rhythm.  No murmur heard. Pulmonary/Chest: Effort normal and breath sounds normal. No respiratory distress.  Abdominal: Soft. Bowel sounds are normal. He exhibits no distension. There is no tenderness. There is no rebound and no guarding.  Musculoskeletal: He exhibits no edema.  Neurological: He is alert. No cranial nerve deficit or sensory deficit. He exhibits normal muscle tone. Coordination normal.  Skin: Skin is warm and dry.  Psychiatric: He has a normal mood and affect.  Nursing note and vitals reviewed.    ED Treatments / Results   Labs (all labs ordered are listed, but only abnormal results are displayed) Labs Reviewed  BASIC METABOLIC PANEL - Abnormal; Notable for the following:       Result Value   CO2 21 (*)    Glucose, Bld 365 (*)    All other components within normal limits  BETA-HYDROXYBUTYRIC ACID - Abnormal; Notable for the following:    Beta-Hydroxybutyric Acid 0.99 (*)    All other components within normal limits  HEPATIC FUNCTION PANEL - Abnormal; Notable for the following:    ALT 13 (*)    Bilirubin, Direct <0.1 (*)    All other components within normal limits  CBG MONITORING, ED - Abnormal; Notable for the following:    Glucose-Capillary 368 (*)    All other components within normal limits  I-STAT VENOUS BLOOD GAS, ED - Abnormal; Notable for the following:    pCO2, Ven 34.2 (*)    All other components within normal limits  CBG MONITORING, ED - Abnormal; Notable for the following:    Glucose-Capillary 264 (*)    All other components within normal limits  GASTROINTESTINAL PANEL BY PCR, STOOL (REPLACES STOOL CULTURE)  CBC  LIPASE, BLOOD    EKG  EKG Interpretation None       Radiology No results found.  Procedures Procedures (including critical care time)  Medications Ordered in ED Medications  sodium chloride 0.9 % bolus 1,000 mL (0 mLs Intravenous Stopped 05/01/16 2100)  ondansetron (ZOFRAN) injection 4 mg (4 mg Intravenous Given 05/01/16 1859)     Initial Impression / Assessment and Plan / ED Course  I have reviewed the triage vital signs and the nursing notes.  Pertinent labs & imaging results that were available during my care of the patient were reviewed by me and considered in my medical decision making (see chart for details).  Clinical Course    50 year old male with diabetes and HIV comes today with multiple days of nausea vomiting diarrhea. Only intermittent abdominal pain. His vitals are stable he's afebrile. On exam his abdomen is soft nontender nondistended. No  focal tenderness. No rebound guarding or signs of peritonitis. Nonbloody nonbilious vomiting nonbloody non-melenic stools. He normal urination. His sugars are elevated to over 300 we'll screen for DKA as well as get a stool sample for GI pathogen panel as he is HIV positive with a CD4 count most recently at 290. Patient is given IV fluids and Zofran. He is not acidotic as initial blood gas shows a pH of 7.4. No neurologic dysfunction no other findings on exam.  Patient is not in DKA. Sugars down after IV fluids are given. IV antiemetics are allowed patient to tolerate by mouth. Does not clinically appear dehydrated. He safe for discharge home. GI pathogen panel can be collected at home. Patient is told how to collect the specimen. Vital signs stable time of discharge. Strict return precautions are given. No other section sources identified. No further workup needed.  Final Clinical Impressions(s) /  ED Diagnoses   Final diagnoses:  Nausea vomiting and diarrhea    New Prescriptions Discharge Medication List as of 05/01/2016  9:02 PM    START taking these medications   Details  ondansetron (ZOFRAN ODT) 4 MG disintegrating tablet Take 1 tablet (4 mg total) by mouth every 8 (eight) hours as needed for nausea or vomiting., Starting Thu 05/01/2016, Print         Cherlynn PerchesEric Horace Lukas, MD 05/02/16 0032    Melene Planan Floyd, DO 05/02/16 [id number]

## 2016-05-01 NOTE — ED Notes (Signed)
Notified Dr Leonides Schanzorsey of elevated glucose level 434

## 2016-05-01 NOTE — ED Triage Notes (Signed)
Symptoms started late Friday/early Saturday.  Patient reports nausea, vomiting, and diarrhea.  Patient says if he holds down what he swallows, it will then be diarrhea.  Patient has been out of work and has weakness

## 2016-05-20 ENCOUNTER — Encounter: Payer: Self-pay | Admitting: Family

## 2016-05-20 ENCOUNTER — Ambulatory Visit (INDEPENDENT_AMBULATORY_CARE_PROVIDER_SITE_OTHER): Payer: 59 | Admitting: Family

## 2016-05-20 ENCOUNTER — Other Ambulatory Visit (INDEPENDENT_AMBULATORY_CARE_PROVIDER_SITE_OTHER): Payer: 59

## 2016-05-20 VITALS — BP 120/70 | HR 85 | Temp 98.0°F | Resp 16 | Ht 67.0 in | Wt 212.0 lb

## 2016-05-20 DIAGNOSIS — E1165 Type 2 diabetes mellitus with hyperglycemia: Secondary | ICD-10-CM | POA: Diagnosis not present

## 2016-05-20 DIAGNOSIS — N181 Chronic kidney disease, stage 1: Secondary | ICD-10-CM

## 2016-05-20 DIAGNOSIS — E1122 Type 2 diabetes mellitus with diabetic chronic kidney disease: Secondary | ICD-10-CM

## 2016-05-20 DIAGNOSIS — IMO0002 Reserved for concepts with insufficient information to code with codable children: Secondary | ICD-10-CM

## 2016-05-20 DIAGNOSIS — B20 Human immunodeficiency virus [HIV] disease: Secondary | ICD-10-CM

## 2016-05-20 LAB — COMPREHENSIVE METABOLIC PANEL
ALBUMIN: 4 g/dL (ref 3.5–5.2)
ALT: 8 U/L (ref 0–53)
AST: 9 U/L (ref 0–37)
Alkaline Phosphatase: 72 U/L (ref 39–117)
BUN: 14 mg/dL (ref 6–23)
CALCIUM: 9 mg/dL (ref 8.4–10.5)
CHLORIDE: 94 meq/L — AB (ref 96–112)
CO2: 26 mEq/L (ref 19–32)
CREATININE: 0.88 mg/dL (ref 0.40–1.50)
GFR: 98.07 mL/min (ref >60.00)
Glucose, Bld: 454 mg/dL — ABNORMAL HIGH (ref 70–99)
POTASSIUM: 4.4 meq/L (ref 3.5–5.1)
Sodium: 129 mEq/L — ABNORMAL LOW (ref 135–145)
Total Bilirubin: 0.4 mg/dL (ref 0.2–1.2)
Total Protein: 7.3 g/dL (ref 6.0–8.3)

## 2016-05-20 LAB — HEMOGLOBIN A1C: Hgb A1c MFr Bld: 14.5 % — ABNORMAL HIGH (ref 4.6–6.5)

## 2016-05-20 LAB — MICROALBUMIN / CREATININE URINE RATIO
CREATININE, U: 35.7 mg/dL
Microalb Creat Ratio: 2 mg/g (ref 0.0–30.0)

## 2016-05-20 NOTE — Progress Notes (Signed)
Subjective:    Patient ID: Brett Walsh, male    DOB: [birth year], 50 y.o.   MRN: [id number]  Chief Complaint  Patient presents with  . Establish Care    diabtes checked    HPI:  Brett Marionverardo Hornaday is a 50 y.o. male who  has a past medical history of Deviated septum; Diabetes mellitus without complication (HCC); and HIV disease (HCC). and presents today for an office visit to establish care.  1.) Type 2 diabetes - Previously prescribed metformin. Reports taking the medication as prescribed and denies adverse side effects. Notes that at higher dosages he experiences gastrointestinal symptoms. Not currently checking blood sugars at home. Last eye exam was December 2016 with the recommendation to complete in a couple of years. Due for a foot exam. Pneumovax done through infectious disease. Endorses occasional blurred vision with no other symptoms of end organ damage. Denies increased hunger, thirst, or urination.    Lab Results  Component Value Date   HGBA1C 7.9 (H) 12/11/2014     No Known Allergies    Outpatient Medications Prior to Visit  Medication Sig Dispense Refill  . metFORMIN (GLUCOPHAGE) 500 MG tablet Take 500 mg by mouth daily. 500 mg in am, 1500 mg in pm    . TRIUMEQ 600-50-300 MG tablet TAKE 1 TABLET BY MOUTH DAILY 90 tablet 5  . loratadine (CLARITIN) 10 MG tablet Take 10 mg by mouth daily.    . ondansetron (ZOFRAN ODT) 4 MG disintegrating tablet Take 1 tablet (4 mg total) by mouth every 8 (eight) hours as needed for nausea or vomiting. 15 tablet 0   No facility-administered medications prior to visit.       History reviewed. No pertinent surgical history.    Past Medical History:  Diagnosis Date  . Deviated septum   . Diabetes mellitus without complication (HCC)   . HIV disease (HCC)       Review of Systems  Eyes:       Negative for changes in vision.  Respiratory: Negative for chest tightness and shortness of breath.   Cardiovascular: Negative for  chest pain, palpitations and leg swelling.  Endocrine: Negative for polydipsia, polyphagia and polyuria.  Neurological: Negative for dizziness, weakness, light-headedness and headaches.      Objective:    BP 120/70 (BP Location: Left Arm, Patient Position: Sitting, Cuff Size: Large)   Pulse 85   Temp 98 F (36.7 C) (Oral)   Resp 16   Ht 5\' 7"  (1.702 m)   Wt 212 lb (96.2 kg)   SpO2 96%   BMI 33.20 kg/m  Nursing note and vital signs reviewed.  Physical Exam  Constitutional: He is oriented to person, place, and time. He appears well-developed and well-nourished. No distress.  Cardiovascular: Normal rate, regular rhythm, normal heart sounds and intact distal pulses.   Pulmonary/Chest: Effort normal and breath sounds normal.  Neurological: He is alert and oriented to person, place, and time.  Diabetic foot exam - bilateral feet are free from skin breakdown, cuts, and abrasions. Toenails are neatly trimmed. Pulses are intact and appropriate. Sensation is intact to monofilament bilaterally.  Skin: Skin is warm and dry.  Psychiatric: He has a normal mood and affect. His behavior is normal. Judgment and thought content normal.       Assessment & Plan:   Problem List Items Addressed This Visit      Endocrine   Diabetes type 2, uncontrolled (HCC) - Primary    Obtain A1c, urine microalbumin, and  CMET. Diabetic foot exam completed. Due for diabetic eye exam encouraged to be completed independently. Encouraged to monitor blood sugars at home once daily. Pneumovax is up to date and managed per infectious disease. Continue current dosage of metformin pending blood work results.       Relevant Orders   Urine Microalbumin w/creat. ratio   Hemoglobin A1c   Comprehensive metabolic panel     Other   HIV disease (HCC)    HIV currently stable and managed by Infectious Disease. Continue current dosage of Triumeq with changes per Infectious Disease.           I have discontinued Mr.  Matthew FolksJaimez's loratadine and ondansetron. I am also having him maintain his metFORMIN and TRIUMEQ.   Follow-up: Return if symptoms worsen or fail to improve.   Jeanine Luzalone, Tyrel Lex, FNP

## 2016-05-20 NOTE — Patient Instructions (Signed)
Thank you for choosing [facility].  SUMMARY AND INSTRUCTIONS:  Please schedule a time for your physical at your convenience.   Medication:  Continue to take your metformin as prescribed.   Your prescription(s) have been submitted to your pharmacy or been printed and provided for you. Please take as directed and contact our office if you believe you are having problem(s) with the medication(s) or have any questions.  Labs:  Please stop by the lab on the lower level of the building for your blood work. Your results will be released to MyChart (or called to you) after review, usually within 72 hours after test completion. If any changes need to be made, you will be notified at that same time.  1.) The lab is open from 7:30am to 5:30 pm Monday-Friday 2.) No appointment is necessary 3.) Fasting (if needed) is 6-8 hours after food and drink; black coffee and water are okay   Follow up:  If your symptoms worsen or fail to improve, please contact our office for further instruction, or in case of emergency go directly to the emergency room at the closest medical facility.   DIABETES CARE INSTRUCTIONS:  Current A1c:  Lab Results  Component Value Date   HGBA1C 7.9 (H) 12/11/2014    A1C Goal: <7.0%  Fasting Blood Sugar Goal: 80-130 Blood sugar check that you take after fasting for at least 8 hours which is usually in the morning before eating.  Post-prandial Blood Sugar Goal: <180 Blood sugar check approximately 1-2 hours after the start of a meal.   Diabetes Prevention Screens:  1.) Ensure you have an annual eye exam by an ophthalmologist or optometrist.   2,) Ensure you have an annual foot exam or when any changes are noted including new onset numbness/tingling or wounds. Check your feet daily!  3.) The American Diabetes Association recommends the Pneumovax vaccination against pneumonia every 5 years.  4.) We will check your kidney function with a urine test on an annual  basis.

## 2016-05-20 NOTE — Assessment & Plan Note (Signed)
Obtain A1c, urine microalbumin, and CMET. Diabetic foot exam completed. Due for diabetic eye exam encouraged to be completed independently. Encouraged to monitor blood sugars at home once daily. Pneumovax is up to date and managed per infectious disease. Continue current dosage of metformin pending blood work results.

## 2016-05-20 NOTE — Assessment & Plan Note (Signed)
HIV currently stable and managed by Infectious Disease. Continue current dosage of Triumeq with changes per Infectious Disease.

## 2016-05-21 ENCOUNTER — Encounter: Payer: Self-pay | Admitting: Family

## 2016-05-21 MED ORDER — DULAGLUTIDE 0.75 MG/0.5ML ~~LOC~~ SOAJ: 0.7500 mg | SUBCUTANEOUS | 1 refills | Status: DC

## 2016-05-22 ENCOUNTER — Other Ambulatory Visit: Payer: Self-pay | Admitting: Family

## 2016-05-22 MED ORDER — EXENATIDE ER 2 MG ~~LOC~~ PEN: 2.0000 mg | PEN_INJECTOR | SUBCUTANEOUS | 2 refills | Status: DC

## 2016-05-22 MED ORDER — EXENATIDE ER 2 MG ~~LOC~~ PEN
2.0000 mg | PEN_INJECTOR | SUBCUTANEOUS | 2 refills | Status: DC
Start: 2016-05-22 — End: 2017-04-17

## 2016-06-11 ENCOUNTER — Encounter: Payer: Self-pay | Admitting: Family

## 2016-06-11 MED ORDER — METFORMIN HCL 500 MG PO TABS: 500.0000 mg | ORAL_TABLET | Freq: Every day | ORAL | 5 refills | Status: DC

## 2016-06-17 ENCOUNTER — Encounter: Payer: 59 | Admitting: Family

## 2016-06-20 ENCOUNTER — Ambulatory Visit (INDEPENDENT_AMBULATORY_CARE_PROVIDER_SITE_OTHER): Payer: 59 | Admitting: Family

## 2016-06-20 ENCOUNTER — Encounter: Payer: Self-pay | Admitting: Family

## 2016-06-20 DIAGNOSIS — Z0000 Encounter for general adult medical examination without abnormal findings: Secondary | ICD-10-CM | POA: Diagnosis not present

## 2016-06-20 NOTE — Assessment & Plan Note (Addendum)
1) Anticipatory Guidance: Discussed importance of wearing a seatbelt while driving and not texting while driving; changing batteries in smoke detector at least once annually; wearing suntan lotion when outside; eating a balanced and moderate diet; getting physical activity at least 30 minutes per day.  2) Immunizations / Screenings / Labs:  All immunizations are up to date per recommendations. Due for a diabetic eye exam encouraged to be completed at his convenience. All other general and diabetic screenings are up to date per recommendations. Previous blood work reviewed and only additional blood work needed is lipid profile.   Overall well exam with risk factors for cardiovascular disease including obesity, diabetes, and tobacco use. Recommend weight loss of 5-10% of current body weight through nutrition and physical activity. Goal of increasing physical activity to 30 minutes of moderate level activity daily. HIV appears adequately controlled. Diabetes remains uncontrolled and does not currently check his blood sugars at home. Discussed tobacco cessation and methods including nicotine patches, gums, inhalers, and possible medication. Information provided and after visit summary which patient will investigate. He has not motivated at this time to quit smoking. Continue other healthy lifestyle behaviors and choices. Follow-up prevention exam in 1 year. Follow-up office visit for chronic conditions in 2 months.

## 2016-06-20 NOTE — Patient Instructions (Addendum)
Thank you for choosing [facility].  SUMMARY AND INSTRUCTIONS:  Follow up in 2 months.   Medication:  Continue to take your medications as prescribed.   Labs:  Please stop by the lab on the lower level of the building for your blood work. Your results will be released to MyChart (or called to you) after review, usually within 72 hours after test completion. If any changes need to be made, you will be notified at that same time.  1.) The lab is open from 7:30am to 5:30 pm Monday-Friday 2.) No appointment is necessary 3.) Fasting (if needed) is 6-8 hours after food and drink; black coffee and water are okay    Follow up:  If your symptoms worsen or fail to improve, please contact our office for further instruction, or in case of emergency go directly to the emergency room at the closest medical facility.    Health Maintenance, Male A healthy lifestyle and preventative care can promote health and wellness.  Maintain regular health, dental, and eye exams.  Eat a healthy diet. Foods like vegetables, fruits, whole grains, low-fat dairy products, and lean protein foods contain the nutrients you need and are low in calories. Decrease your intake of foods high in solid fats, added sugars, and salt. Get information about a proper diet from your health care provider, if necessary.  Regular physical exercise is one of the most important things you can do for your health. Most adults should get at least 150 minutes of moderate-intensity exercise (any activity that increases your heart rate and causes you to sweat) each week. In addition, most adults need muscle-strengthening exercises on 2 or more days a week.   Maintain a healthy weight. The body mass index (BMI) is a screening tool to identify possible weight problems. It provides an estimate of body fat based on height and weight. Your health care provider can find your BMI and can help you achieve or maintain a healthy weight. For  males 20 years and older:  A BMI below 18.5 is considered underweight.  A BMI of 18.5 to 24.9 is normal.  A BMI of 25 to 29.9 is considered overweight.  A BMI of 30 and above is considered obese.  Maintain normal blood lipids and cholesterol by exercising and minimizing your intake of saturated fat. Eat a balanced diet with plenty of fruits and vegetables. Blood tests for lipids and cholesterol should begin at age 22 and be repeated every 5 years. If your lipid or cholesterol levels are high, you are over age 31, or you are at high risk for heart disease, you may need your cholesterol levels checked more frequently.Ongoing high lipid and cholesterol levels should be treated with medicines if diet and exercise are not working.  If you smoke, find out from your health care provider how to quit. If you do not use tobacco, do not start.  Lung cancer screening is recommended for adults aged 55-80 years who are at high risk for developing lung cancer because of a history of smoking. A yearly low-dose CT scan of the lungs is recommended for people who have at least a 30-pack-year history of smoking and are current smokers or have quit within the past 15 years. A pack year of smoking is smoking an average of 1 pack of cigarettes a day for 1 year (for example, a 30-pack-year history of smoking could mean smoking 1 pack a day for 30 years or 2 packs a day for 15 years). Yearly  screening should continue until the smoker has stopped smoking for at least 15 years. Yearly screening should be stopped for people who develop a health problem that would prevent them from having lung cancer treatment.  If you choose to drink alcohol, do not have more than 2 drinks per day. One drink is considered to be 12 oz (360 mL) of beer, 5 oz (150 mL) of wine, or 1.5 oz (45 mL) of liquor.  Avoid the use of street drugs. Do not share needles with anyone. Ask for help if you need support or instructions about stopping the use of  drugs.  High blood pressure causes heart disease and increases the risk of stroke. High blood pressure is more likely to develop in:  People who have blood pressure in the end of the normal range (100-139/85-89 mm Hg).  People who are overweight or obese.  People who are African American.  If you are 48-31 years of age, have your blood pressure checked every 3-5 years. If you are 85 years of age or older, have your blood pressure checked every year. You should have your blood pressure measured twice-once when you are at a hospital or clinic, and once when you are not at a hospital or clinic. Record the average of the two measurements. To check your blood pressure when you are not at a hospital or clinic, you can use:  An automated blood pressure machine at a pharmacy.  A home blood pressure monitor.  If you are 12-71 years old, ask your health care provider if you should take aspirin to prevent heart disease.  Diabetes screening involves taking a blood sample to check your fasting blood sugar level. This should be done once every 3 years after age 67 if you are at a normal weight and without risk factors for diabetes. Testing should be considered at a younger age or be carried out more frequently if you are overweight and have at least 1 risk factor for diabetes.  Colorectal cancer can be detected and often prevented. Most routine colorectal cancer screening begins at the age of 22 and continues through age 8. However, your health care provider may recommend screening at an earlier age if you have risk factors for colon cancer. On a yearly basis, your health care provider may provide home test kits to check for hidden blood in the stool. A small camera at the end of a tube may be used to directly examine the colon (sigmoidoscopy or colonoscopy) to detect the earliest forms of colorectal cancer. Talk to your health care provider about this at age 8 when routine screening begins. A direct exam of  the colon should be repeated every 5-10 years through age 69, unless early forms of precancerous polyps or small growths are found.  People who are at an increased risk for hepatitis B should be screened for this virus. You are considered at high risk for hepatitis B if:  You were born in a country where hepatitis B occurs often. Talk with your health care provider about which countries are considered high risk.  Your parents were born in a high-risk country and you have not received a shot to protect against hepatitis B (hepatitis B vaccine).  You have HIV or AIDS.  You use needles to inject street drugs.  You live with, or have sex with, someone who has hepatitis B.  You are a man who has sex with other men (MSM).  You get hemodialysis treatment.  You take  certain medicines for conditions like cancer, organ transplantation, and autoimmune conditions.  Hepatitis C blood testing is recommended for all people born from [birth year] through 1963 and any individual with known risk factors for hepatitis C.  Healthy men should no longer receive prostate-specific antigen (PSA) blood tests as part of routine cancer screening. Talk to your health care provider about prostate cancer screening.  Testicular cancer screening is not recommended for adolescents or adult males who have no symptoms. Screening includes self-exam, a health care provider exam, and other screening tests. Consult with your health care provider about any symptoms you have or any concerns you have about testicular cancer.  Practice safe sex. Use condoms and avoid high-risk sexual practices to reduce the spread of sexually transmitted infections (STIs).  You should be screened for STIs, including gonorrhea and chlamydia if:  You are sexually active and are younger than 24 years.  You are older than 24 years, and your health care provider tells you that you are at risk for this type of infection.  Your sexual activity has changed  since you were last screened, and you are at an increased risk for chlamydia or gonorrhea. Ask your health care provider if you are at risk.  If you are at risk of being infected with HIV, it is recommended that you take a prescription medicine daily to prevent HIV infection. This is called pre-exposure prophylaxis (PrEP). You are considered at risk if:  You are a man who has sex with other men (MSM).  You are a heterosexual man who is sexually active with multiple partners.  You take drugs by injection.  You are sexually active with a partner who has HIV.  Talk with your health care provider about whether you are at high risk of being infected with HIV. If you choose to begin PrEP, you should first be tested for HIV. You should then be tested every 3 months for as long as you are taking PrEP.  Use sunscreen. Apply sunscreen liberally and repeatedly throughout the day. You should seek shade when your shadow is shorter than you. Protect yourself by wearing long sleeves, pants, a wide-brimmed hat, and sunglasses year round whenever you are outdoors.  Tell your health care provider of new moles or changes in moles, especially if there is a change in shape or color. Also, tell your health care provider if a mole is larger than the size of a pencil eraser.  A one-time screening for abdominal aortic aneurysm (AAA) and surgical repair of large AAAs by ultrasound is recommended for men aged 65-75 years who are current or former smokers.  Stay current with your vaccines (immunizations). This information is not intended to replace advice given to you by your health care provider. Make sure you discuss any questions you have with your health care provider. Document Released: 11/15/2007 Document Revised: 06/09/2014 Document Reviewed: 02/20/2015 Elsevier Interactive Patient Education  2017 Elsevier Inc.   Smoking Hazards Smoking cigarettes is extremely bad for your health. Tobacco smoke has over 200  known poisons in it. It contains the poisonous gases nitrogen oxide and carbon monoxide. There are over 60 chemicals in tobacco smoke that cause cancer. Some of the chemicals found in cigarette smoke include:   Cyanide.   Benzene.   Formaldehyde.   Methanol (wood alcohol).   Acetylene (fuel used in welding torches).   Ammonia.  Even smoking lightly shortens your life expectancy by several years. You can greatly reduce the risk of medical problems for  you and your family by stopping now. Smoking is the most preventable cause of death and disease in our society. Within days of quitting smoking, your circulation improves, you decrease the risk of having a heart attack, and your lung capacity improves. There may be some increased phlegm in the first few days after quitting, and it may take months for your lungs to clear up completely. Quitting for 10 years reduces your risk of developing lung cancer to almost that of a nonsmoker.  WHAT ARE THE RISKS OF SMOKING? Cigarette smokers have an increased risk of many serious medical problems, including:  Lung cancer.   Lung disease (such as pneumonia, bronchitis, and emphysema).   Heart attack and chest pain due to the heart not getting enough oxygen (angina).   Heart disease and peripheral blood vessel disease.   Hypertension.   Stroke.   Oral cancer (cancer of the lip, mouth, or voice box).   Bladder cancer.   Pancreatic cancer.   Cervical cancer.   Pregnancy complications, including premature birth.   Stillbirths and smaller newborn babies, birth defects, and genetic damage to sperm.   Early menopause.   Lower estrogen level for women.   Infertility.   Facial wrinkles.   Blindness.   Increased risk of broken bones (fractures).   Senile dementia.   Stomach ulcers and internal bleeding.   Delayed wound healing and increased risk of complications during surgery. Because of secondhand smoke  exposure, children of smokers have an increased risk of the following:   Sudden infant death syndrome (SIDS).   Respiratory infections.   Lung cancer.   Heart disease.   Ear infections.  WHY IS SMOKING ADDICTIVE? Nicotine is the chemical agent in tobacco that is capable of causing addiction or dependence. When you smoke and inhale, nicotine is absorbed rapidly into the bloodstream through your lungs. Both inhaled and noninhaled nicotine may be addictive.  WHAT ARE THE BENEFITS OF QUITTING?  There are many health benefits to quitting smoking. Some are:   The likelihood of developing cancer and heart disease decreases. Health improvements are seen almost immediately.   Blood pressure, pulse rate, and breathing patterns start returning to normal soon after quitting.   People who quit may see an improvement in their overall quality of life.  HOW DO YOU QUIT SMOKING? Smoking is an addiction with both physical and psychological effects, and longtime habits can be hard to change. Your health care provider can recommend:  Programs and community resources, which may include group support, education, or therapy.  Replacement products, such as patches, gum, and nasal sprays. Use these products only as directed. Do not replace cigarette smoking with electronic cigarettes (commonly called e-cigarettes). The safety of e-cigarettes is unknown, and some may contain harmful chemicals. FOR MORE INFORMATION  American Lung Association: www.lung.org  American Cancer Society: www.cancer.org   This information is not intended to replace advice given to you by your health care provider. Make sure you discuss any questions you have with your health care provider.   Document Released: 06/26/2004 Document Revised: 03/09/2013 Document Reviewed: 11/08/2012 Elsevier Interactive Patient Education 2016 [employer].  Steps to Quit Smoking  Smoking tobacco can be harmful to your health and can affect  almost every organ in your body. Smoking puts you, and those around you, at risk for developing many serious chronic diseases. Quitting smoking is difficult, but it is one of the best things that you can do for your health. It is never too late to  quit. WHAT ARE THE BENEFITS OF QUITTING SMOKING? When you quit smoking, you lower your risk of developing serious diseases and conditions, such as:  Lung cancer or lung disease, such as COPD.  Heart disease.  Stroke.  Heart attack.  Infertility.  Osteoporosis and bone fractures. Additionally, symptoms such as coughing, wheezing, and shortness of breath may get better when you quit. You may also find that you get sick less often because your body is stronger at fighting off colds and infections. If you are pregnant, quitting smoking can help to reduce your chances of having a baby of low birth weight. HOW DO I GET READY TO QUIT? When you decide to quit smoking, create a plan to make sure that you are successful. Before you quit:  Pick a date to quit. Set a date within the next two weeks to give you time to prepare.  Write down the reasons why you are quitting. Keep this list in places where you will see it often, such as on your bathroom mirror or in your car or wallet.  Identify the people, places, things, and activities that make you want to smoke (triggers) and avoid them. Make sure to take these actions:  Throw away all cigarettes at home, at work, and in your car.  Throw away smoking accessories, such as Set designer.  Clean your car and make sure to empty the ashtray.  Clean your home, including curtains and carpets.  Tell your family, friends, and coworkers that you are quitting. Support from your loved ones can make quitting easier.  Talk with your health care provider about your options for quitting smoking.  Find out what treatment options are covered by your health insurance. WHAT STRATEGIES CAN I USE TO QUIT  SMOKING?  Talk with your healthcare provider about different strategies to quit smoking. Some strategies include:  Quitting smoking altogether instead of gradually lessening how much you smoke over a period of time. Research shows that quitting "cold [place]" is more successful than gradually quitting.  Attending in-person counseling to help you build problem-solving skills. You are more likely to have success in quitting if you attend several counseling sessions. Even short sessions of 10 minutes can be effective.  Finding resources and support systems that can help you to quit smoking and remain smoke-free after you quit. These resources are most helpful when you use them often. They can include:  Online chats with a Veterinary surgeon.  Telephone quitlines.  Printed Materials engineer.  Support groups or group counseling.  Text messaging programs.  Mobile phone applications.  Taking medicines to help you quit smoking. (If you are pregnant or breastfeeding, talk with your health care provider first.) Some medicines contain nicotine and some do not. Both types of medicines help with cravings, but the medicines that include nicotine help to relieve withdrawal symptoms. Your health care provider may recommend:  Nicotine patches, gum, or lozenges.  Nicotine inhalers or sprays.  Non-nicotine medicine that is taken by mouth. Talk with your health care provider about combining strategies, such as taking medicines while you are also receiving in-person counseling. Using these two strategies together makes you more likely to succeed in quitting than if you used either strategy on its own. If you are pregnant or breastfeeding, talk with your health care provider about finding counseling or other support strategies to quit smoking. Do not take medicine to help you quit smoking unless told to do so by your health care provider. WHAT THINGS CAN I DO  TO MAKE IT EASIER TO QUIT? Quitting smoking might feel  overwhelming at first, but there is a lot that you can do to make it easier. Take these important actions:  Reach out to your family and friends and ask that they support and encourage you during this time. Call telephone quitlines, reach out to support groups, or work with a counselor for support.  Ask people who smoke to avoid smoking around you.  Avoid places that trigger you to smoke, such as bars, parties, or smoke-break areas at work.  Spend time around people who do not smoke.  Lessen stress in your life, because stress can be a smoking trigger for some people. To lessen stress, try:  Exercising regularly.  Deep-breathing exercises.  Yoga.  Meditating.  Performing a body scan. This involves closing your eyes, scanning your body from head to toe, and noticing which parts of your body are particularly tense. Purposefully relax the muscles in those areas.  Download or purchase mobile phone or tablet apps (applications) that can help you stick to your quit plan by providing reminders, tips, and encouragement. There are many free apps, such as QuitGuide from the [employer] developer for Disease Control and Prevention). You can find other support for quitting smoking (smoking cessation) through smokefree.gov and other websites. HOW WILL I FEEL WHEN I QUIT SMOKING? Within the first 24 hours of quitting smoking, you may start to feel some withdrawal symptoms. These symptoms are usually most noticeable 2-3 days after quitting, but they usually do not last beyond 2-3 weeks. Changes or symptoms that you might experience include:  Mood swings.  Restlessness, anxiety, or irritation.  Difficulty concentrating.  Dizziness.  Strong cravings for sugary foods in addition to nicotine.  Mild weight gain.  Constipation.  Nausea.  Coughing or a sore throat.  Changes in how your medicines work in your body.  A depressed mood.  Difficulty sleeping (insomnia). After the first 2-3 weeks of  quitting, you may start to notice more positive results, such as:  Improved sense of smell and taste.  Decreased coughing and sore throat.  Slower heart rate.  Lower blood pressure.  Clearer skin.  The ability to breathe more easily.  Fewer sick days. Quitting smoking is very challenging for most people. Do not get discouraged if you are not successful the first time. Some people need to make many attempts to quit before they achieve long-term success. Do your best to stick to your quit plan, and talk with your health care provider if you have any questions or concerns.   This information is not intended to replace advice given to you by your health care provider. Make sure you discuss any questions you have with your health care provider.   Document Released: 05/13/2001 Document Revised: 10/03/2014 Document Reviewed: 10/03/2014 Elsevier Interactive Patient Education 2016 [employer].  Smoking Cessation, Tips for Success If you are ready to quit smoking, congratulations! You have chosen to help yourself be healthier. Cigarettes bring nicotine, tar, carbon monoxide, and other irritants into your body. Your lungs, heart, and blood vessels will be able to work better without these poisons. There are many different ways to quit smoking. Nicotine gum, nicotine patches, a nicotine inhaler, or nicotine nasal spray can help with physical craving. Hypnosis, support groups, and medicines help break the habit of smoking. WHAT THINGS CAN I DO TO MAKE QUITTING EASIER?  Here are some tips to help you quit for good:  Pick a date when you will quit smoking  completely. Tell all of your friends and family about your plan to quit on that date.  Do not try to slowly cut down on the number of cigarettes you are smoking. Pick a quit date and quit smoking completely starting on that day.  Throw away all cigarettes.   Clean and remove all ashtrays from your home, work, and car.  On a card, write down  your reasons for quitting. Carry the card with you and read it when you get the urge to smoke.  Cleanse your body of nicotine. Drink enough water and fluids to keep your urine clear or pale yellow. Do this after quitting to flush the nicotine from your body.  Learn to predict your moods. Do not let a bad situation be your excuse to have a cigarette. Some situations in your life might tempt you into wanting a cigarette.  Never have "just one" cigarette. It leads to wanting another and another. Remind yourself of your decision to quit.  Change habits associated with smoking. If you smoked while driving or when feeling stressed, try other activities to replace smoking. Stand up when drinking your coffee. Brush your teeth after eating. Sit in a different chair when you read the paper. Avoid alcohol while trying to quit, and try to drink fewer caffeinated beverages. Alcohol and caffeine may urge you to smoke.  Avoid foods and drinks that can trigger a desire to smoke, such as sugary or spicy foods and alcohol.  Ask people who smoke not to smoke around you.  Have something planned to do right after eating or having a cup of coffee. For example, plan to take a walk or exercise.  Try a relaxation exercise to calm you down and decrease your stress. Remember, you may be tense and nervous for the first 2 weeks after you quit, but this will pass.  Find new activities to keep your hands busy. Play with a pen, coin, or rubber band. Doodle or draw things on paper.  Brush your teeth right after eating. This will help cut down on the craving for the taste of tobacco after meals. You can also try mouthwash.   Use oral substitutes in place of cigarettes. Try using lemon drops, carrots, cinnamon sticks, or chewing gum. Keep them handy so they are available when you have the urge to smoke.  When you have the urge to smoke, try deep breathing.  Designate your home as a nonsmoking area.  If you are a heavy  smoker, ask your health care provider about a prescription for nicotine chewing gum. It can ease your withdrawal from nicotine.  Reward yourself. Set aside the cigarette money you save and buy yourself something nice.  Look for support from others. Join a support group or smoking cessation program. Ask someone at home or at work to help you with your plan to quit smoking.  Always ask yourself, "Do I need this cigarette or is this just a reflex?" Tell yourself, "Today, I choose not to smoke," or "I do not want to smoke." You are reminding yourself of your decision to quit.  Do not replace cigarette smoking with electronic cigarettes (commonly called e-cigarettes). The safety of e-cigarettes is unknown, and some may contain harmful chemicals.  If you relapse, do not give up! Plan ahead and think about what you will do the next time you get the urge to smoke. HOW WILL I FEEL WHEN I QUIT SMOKING? You may have symptoms of withdrawal because your body is used  to nicotine (the addictive substance in cigarettes). You may crave cigarettes, be irritable, feel very hungry, cough often, get headaches, or have difficulty concentrating. The withdrawal symptoms are only temporary. They are strongest when you first quit but will go away within 10-14 days. When withdrawal symptoms occur, stay in control. Think about your reasons for quitting. Remind yourself that these are signs that your body is healing and getting used to being without cigarettes. Remember that withdrawal symptoms are easier to treat than the major diseases that smoking can cause.  Even after the withdrawal is over, expect periodic urges to smoke. However, these cravings are generally short lived and will go away whether you smoke or not. Do not smoke! WHAT RESOURCES ARE AVAILABLE TO HELP ME QUIT SMOKING? Your health care provider can direct you to community resources or hospitals for support, which may include:  Group  support.  Education.  Hypnosis.  Therapy.   This information is not intended to replace advice given to you by your health care provider. Make sure you discuss any questions you have with your health care provider.   Document Released: 02/15/2004 Document Revised: 06/09/2014 Document Reviewed: 11/04/2012 Elsevier Interactive Patient Education 2016 [employer].  Bupropion sustained-release tablets (smoking cessation) What is this medicine? BUPROPION (byoo PROE pee on) is used to help people quit smoking. This medicine may be used for other purposes; ask your health care provider or pharmacist if you have questions. COMMON BRAND NAME(S): Buproban, Zyban What should I tell my health care provider before I take this medicine? They need to know if you have any of these conditions: -an eating disorder, such as anorexia or bulimia -bipolar disorder or psychosis -diabetes or high blood sugar, treated with medication -glaucoma -head injury or brain tumor -heart disease, previous heart attack, or irregular heart beat -high blood pressure -kidney or liver disease -seizures -suicidal thoughts or a previous suicide attempt -Tourette's syndrome -weight loss -an unusual or allergic reaction to bupropion, other medicines, foods, dyes, or preservatives -breast-feeding -pregnant or trying to become pregnant How should I use this medicine? Take this medicine by mouth with a glass of water. Follow the directions on the prescription label. You can take it with or without food. If it upsets your stomach, take it with food. Do not cut, crush or chew this medicine. Take your medicine at regular intervals. If you take this medicine more than once a day, take your second dose at least 8 hours after you take your first dose. To limit difficulty in sleeping, avoid taking this medicine at bedtime. Do not take your medicine more often than directed. Do not stop taking this medicine suddenly except upon the  advice of your doctor. Stopping this medicine too quickly may cause serious side effects. A special MedGuide will be given to you by the pharmacist with each prescription and refill. Be sure to read this information carefully each time. Talk to your pediatrician regarding the use of this medicine in children. Special care may be needed. Overdosage: If you think you have taken too much of this medicine contact a poison control center or emergency room at once. NOTE: This medicine is only for you. Do not share this medicine with others. What if I miss a dose? If you miss a dose, skip the missed dose and take your next tablet at the regular time. There should be at least 8 hours between doses. Do not take double or extra doses. What may interact with this medicine? Do not  take this medicine with any of the following medications: -linezolid -MAOIs like Azilect, Carbex, Eldepryl, Marplan, Nardil, and Parnate -methylene blue (injected into a vein) -other medicines that contain bupropion like Wellbutrin This medicine may also interact with the following medications: -alcohol -certain medicines for anxiety or sleep -certain medicines for blood pressure like metoprolol, propranolol -certain medicines for depression or psychotic disturbances -certain medicines for HIV or AIDS like efavirenz, lopinavir, nelfinavir, ritonavir -certain medicines for irregular heart beat like propafenone, flecainide -certain medicines for Parkinson's disease like amantadine, levodopa -certain medicines for seizures like carbamazepine, phenytoin, phenobarbital -cimetidine -clopidogrel -cyclophosphamide -digoxin -furazolidone -isoniazid -nicotine -orphenadrine -procarbazine -steroid medicines like prednisone or cortisone -stimulant medicines for attention disorders, weight loss, or to stay awake -tamoxifen -theophylline -thiotepa -ticlopidine -tramadol -warfarin This list may not describe all possible  interactions. Give your health care provider a list of all the medicines, herbs, non-prescription drugs, or dietary supplements you use. Also tell them if you smoke, drink alcohol, or use illegal drugs. Some items may interact with your medicine. What should I watch for while using this medicine? Visit your doctor or health care professional for regular checks on your progress. This medicine should be used together with a patient support program. It is important to participate in a behavioral program, counseling, or other support program that is recommended by your health care professional. Patients and their families should watch out for new or worsening thoughts of suicide or depression. Also watch out for sudden changes in feelings such as feeling anxious, agitated, panicky, irritable, hostile, aggressive, impulsive, severely restless, overly excited and hyperactive, or not being able to sleep. If this happens, especially at the beginning of treatment or after a change in dose, call your health care professional. Avoid alcoholic drinks while taking this medicine. Drinking excessive alcoholic beverages, using sleeping or anxiety medicines, or quickly stopping the use of these agents while taking this medicine may increase your risk for a seizure. Do not drive or use heavy machinery until you know how this medicine affects you. This medicine can impair your ability to perform these tasks. Do not take this medicine close to bedtime. It may prevent you from sleeping. Your mouth may get dry. Chewing sugarless gum or sucking hard candy, and drinking plenty of water may help. Contact your doctor if the problem does not go away or is severe. Do not use nicotine patches or chewing gum without the advice of your doctor or health care professional while taking this medicine. You may need to have your blood pressure taken regularly if your doctor recommends that you use both nicotine and this medicine together. What  side effects may I notice from receiving this medicine? Side effects that you should report to your doctor or health care professional as soon as possible: -allergic reactions like skin rash, itching or hives, swelling of the face, lips, or tongue -breathing problems -changes in vision -confusion -elevated mood, decreased need for sleep, racing thoughts, impulsive behavior -fast or irregular heartbeat -hallucinations, loss of contact with reality -increased blood pressure -redness, blistering, peeling or loosening of the skin, including inside the mouth -seizures -suicidal thoughts or other mood changes -unusually weak or tired -vomiting Side effects that usually do not require medical attention (report to your doctor or health care professional if they continue or are bothersome): -constipation -headache -loss of appetite -nausea -tremors -weight loss This list may not describe all possible side effects. Call your doctor for medical advice about side effects. You may report side  effects to FDA at 1-800-FDA-1088. Where should I keep my medicine? Keep out of the reach of children. Store at room temperature between 20 and 25 degrees C (68 and 77 degrees F). Protect from light. Keep container tightly closed. Throw away any unused medicine after the expiration date. NOTE: This sheet is a summary. It may not cover all possible information. If you have questions about this medicine, talk to your doctor, pharmacist, or health care provider.  2017 Elsevier/Gold Standard (2015-11-09 13:49:28)   What You Need to Know About Electronic Cigarettes Electronic cigarettes, or e-cigarettes, are battery-operated devices that deliver nicotine to your body. They come in many shapes, including in the shape of a cigarette, pipe, pen, and even a USB memory stick. E-cigarettes have a cartridge that contains a liquid form of nicotine. When you use the device, the liquid heats up. It then becomes a vapor.  Inhaling this vapor is called vaping. While e-cigarettes do not contain tar and the same cancer-causing chemicals that are in tobacco cigarettes, they may contain other harmful and cancer-causing chemicals, such as formaldehyde or acetaldehyde. Nicotine is thought to increase your risk for certain types of cancer. Many e-cigarettes have chemical colorings and flavorings. It is not clear how much nicotine you get when vaping. The health effects of vaping are not completely known. Some people may use e-cigarettes in order to quit smoking tobacco. However, this has not been proven to work, and the Education officer, environmental (FDA) has not approved e-cigarettes for this purpose. How can using electronic cigarettes affect me?  E-cigarettes contain nicotine, which is a very addictive drug. Vaping may make you crave nicotine. Nicotine:  Changes your blood sugar levels.  Increases your heart rate, blood pressure, and breathing rate.  Increases your risk of developing blood clots (hypercoaguable state) and diabetes.  If you smoke e-cigarettes, you may be more likely to start smoking or to smoke more tobacco cigarettes.  Becoming addicted to nicotine may make your brain more sensitive to other addictive drugs. You may move to other addictive substances.  If you are pregnant, the nicotine in e-cigarettes may be harmful to your baby. Nicotine can cause:  Brain or lung problems for your baby.  Your baby to be born too early.  Your baby to be born with a low birth weight.  If you are a child or a teen, vaping may affect your memory or lower your attention span.  You may be in danger of overdosing on nicotine. Nicotine poisoning can cause nausea, vomiting, seizures, and trouble breathing. What are the benefits of stopping vaping? If you stop vaping, you can avoid:  Getting addicted to nicotine.  Having nicotine side effects.  Getting nicotine poisoning.  Being exposed to dangerous  chemicals.  Increasing your risk of health problems.  Increasing your baby's risk of health problems, if you are pregnant.  Being more likely to use other addictive substances. What steps can I take to stop vaping? If you can stop vaping on your own, do it before you become addicted to nicotine. If you need help stopping, ask your health care provider. There are three effective ways to fight nicotine addiction:  Nicotine replacement therapy. Using nicotine gum or a nicotine patch blocks your craving for nicotine. Over time, you can reduce the amount of nicotine you use until you can stop using nicotine completely without having cravings.  Prescription medicines approved to fight nicotine addiction. These stop nicotine cravings or block the effects of nicotine.  Behavioral  therapy. This may include:  A self-help smoking cessation program.  Individual or group therapy.  A smoking cessation support group. Where can I get support? You can get support at these sites:  U[employer] of [employer]: WealthAccelerators.nl  U.S. Department of Health and Human Services: https://smokefree.gov  American Lung Association: ModelSolar.es Where can I get more information? Learn more about e-cigarettes from:  U.[place] of Health: http://www.pena.net/  U.S. Department of Health and Human Services: PrankTips.hu Summary  E-cigarettes can cause nicotine addiction.  E-cigarettes are not approved as a way to stop smoking.  E-cigarettes are not a risk-free alternative to smoking tobacco.  There are ways to fight nicotine addiction.  Talk to your health care provider if you are unable to stop vaping on your own. This information is not intended to replace advice given to you by  your health care provider. Make sure you discuss any questions you have with your health care provider. Document Released: 09/10/2015 Document Revised: 02/06/2016 Document Reviewed: 05/11/2015 Elsevier Interactive Patient Education  2017 [employer].

## 2016-06-20 NOTE — Progress Notes (Signed)
Subjective:    Patient ID: Brett Walsh, male    DOB: [birth year], 51 y.o.   MRN: [id number]  Chief Complaint  Patient presents with  . CPE    not fasing    HPI:  Brett Walsh is a 51 y.o. male who presents today for an annual wellness visit.   1) Health Maintenance -   Diet - Averages about 2-3 times per day consisting of a regular diet; Caffeine intake of about 4-5 cups per day.   Exercise - Little exercise   2) Preventative Exams / Immunizations:  Dental -- Due for exam  Vision -- Due for exam.    Health Maintenance  Topic Date Due  . OPHTHALMOLOGY EXAM  12/21/1977  . HEMOGLOBIN A1C  11/18/2016  . FOOT EXAM  05/20/2017  . URINE MICROALBUMIN  05/20/2017  . PNEUMOCOCCAL POLYSACCHARIDE VACCINE (2) 09/10/2018  . TETANUS/TDAP  10/01/2023  . INFLUENZA VACCINE  Completed  . HIV Screening  Completed    Immunization History  Administered Date(s) Administered  . Hepatitis A, Adult 10/20/2014, 07/12/2015  . Hepatitis B 09/09/2013  . Hepatitis B, adult 10/20/2014, 07/12/2015  . Influenza, Seasonal, Injecte, Preservative Fre 01/20/2015  . Influenza,inj,Quad PF,36+ Mos 05/30/2014, 01/21/2016  . Pneumococcal-Unspecified 09/09/2013  . Td 09/30/2013     No Known Allergies   Outpatient Medications Prior to Visit  Medication Sig Dispense Refill  . Exenatide ER (BYDUREON) 2 MG PEN Inject 2 mg into the skin once a week. 4 each 2  . metFORMIN (GLUCOPHAGE) 500 MG tablet Take 1 tablet (500 mg total) by mouth daily. 500 mg in am, 1500 mg in pm 120 tablet 5  . TRIUMEQ 600-50-300 MG tablet TAKE 1 TABLET BY MOUTH DAILY 90 tablet 5   No facility-administered medications prior to visit.      Past Medical History:  Diagnosis Date  . Deviated septum   . Diabetes mellitus without complication (HCC)   . HIV disease (HCC)      History reviewed. No pertinent surgical history.   Family History  Problem Relation Age of Onset  . Diabetes Brother   . Diabetes Mother    . Diabetes Father   . Heart failure Father      Social History   Social History  . Marital status: Single    Spouse name: N/A  . Number of children: 0  . Years of education: 14   Occupational History  . Risk analyst    Social History Main Topics  . Smoking status: Current Every Day Smoker    Packs/day: 0.50    Years: 36.00    Types: Cigarettes  . Smokeless tobacco: Never Used  . Alcohol use 0.6 oz/week    1 Standard drinks or equivalent per week     Comment: socially  . Drug use: No  . Sexual activity: No     Comment: pt. declined condoms   Other Topics Concern  . Not on file   Social History Narrative   Fun: Watch TV, veg at home.       Review of Systems  Constitutional: Denies fever, chills, fatigue, or significant weight gain/loss. HENT: Head: Denies headache or neck pain Ears: Denies changes in hearing, ringing in ears, earache, drainage Nose: Denies discharge, stuffiness, itching, nosebleed, sinus pain Throat: Denies sore throat, hoarseness, dry mouth, sores, thrush Eyes: Denies loss/changes in vision, pain, redness, blurry/double vision, flashing lights Cardiovascular: Denies chest pain/discomfort, tightness, palpitations, shortness of breath with activity, difficulty lying down, swelling, sudden  awakening with shortness of breath Respiratory: Denies shortness of breath, cough, sputum production, wheezing Gastrointestinal: Denies dysphasia, heartburn, change in appetite, nausea, change in bowel habits, rectal bleeding, constipation, diarrhea, yellow skin or eyes Genitourinary: Denies frequency, urgency, burning/pain, blood in urine, incontinence, change in urinary strength. Musculoskeletal: Denies muscle/joint pain, stiffness, back pain, redness or swelling of joints, trauma Skin: Denies rashes, lumps, itching, dryness, color changes, or hair/nail changes Neurological: Denies dizziness, fainting, seizures, weakness, numbness, tingling,  tremor Psychiatric - Denies nervousness, stress, depression or memory loss Endocrine: Denies heat or cold intolerance, sweating, frequent urination, excessive thirst, changes in appetite Hematologic: Denies ease of bruising or bleeding     Objective:     BP 130/80 (BP Location: Left Arm, Patient Position: Sitting, Cuff Size: Large)   Pulse (!) 105   Temp 97.6 F (36.4 C) (Oral)   Resp 16   Ht 5\' 7"  (1.702 m)   Wt 203 lb 12.8 oz (92.4 kg)   SpO2 96%   BMI 31.92 kg/m  Nursing note and vital signs reviewed.  Physical Exam  Constitutional: He is oriented to person, place, and time. He appears well-developed and well-nourished.  HENT:  Head: Normocephalic.  Right Ear: Hearing, tympanic membrane, external ear and ear canal normal.  Left Ear: Hearing, tympanic membrane, external ear and ear canal normal.  Nose: Nose normal.  Mouth/Throat: Uvula is midline, oropharynx is clear and moist and mucous membranes are normal.  Eyes: Conjunctivae and EOM are normal. Pupils are equal, round, and reactive to light.  Neck: Neck supple. No JVD present. No tracheal deviation present. No thyromegaly present.  Cardiovascular: Normal rate, regular rhythm, normal heart sounds and intact distal pulses.   Pulmonary/Chest: Effort normal and breath sounds normal.  Abdominal: Soft. Bowel sounds are normal. He exhibits no distension and no mass. There is no tenderness. There is no rebound and no guarding.  Musculoskeletal: Normal range of motion. He exhibits no edema or tenderness.  Lymphadenopathy:    He has no cervical adenopathy.  Neurological: He is alert and oriented to person, place, and time. He has normal reflexes. No cranial nerve deficit. He exhibits normal muscle tone. Coordination normal.  Skin: Skin is warm and dry.  Psychiatric: He has a normal mood and affect. His behavior is normal. Judgment and thought content normal.       Assessment & Plan:   Problem List Items Addressed This Visit       Other   Routine general medical examination at a health care facility    1) Anticipatory Guidance: Discussed importance of wearing a seatbelt while driving and not texting while driving; changing batteries in smoke detector at least once annually; wearing suntan lotion when outside; eating a balanced and moderate diet; getting physical activity at least 30 minutes per day.  2) Immunizations / Screenings / Labs:  All immunizations are up to date per recommendations. Due for a diabetic eye exam encouraged to be completed at his convenience. All other general and diabetic screenings are up to date per recommendations. Previous blood work reviewed and only additional blood work needed is lipid profile.   Overall well exam with risk factors for cardiovascular disease including obesity, diabetes, and tobacco use. Recommend weight loss of 5-10% of current body weight through nutrition and physical activity. Goal of increasing physical activity to 30 minutes of moderate level activity daily. HIV appears adequately controlled. Diabetes remains uncontrolled and does not currently check his blood sugars at home. Discussed tobacco cessation and methods  including nicotine patches, gums, inhalers, and possible medication. Information provided and after visit summary which patient will investigate. He has not motivated at this time to quit smoking. Continue other healthy lifestyle behaviors and choices. Follow-up prevention exam in 1 year. Follow-up office visit for chronic conditions in 2 months.           I am having Mr. Vevelyn PatJaimez maintain his TRIUMEQ, Exenatide ER, and metFORMIN.   Follow-up: Return in about 2 months (around 08/18/2016), or if symptoms worsen or fail to improve.   Jeanine Luzalone, Gregory, FNP

## 2016-07-09 ENCOUNTER — Other Ambulatory Visit: Payer: 59

## 2016-07-09 DIAGNOSIS — Z79899 Other long term (current) drug therapy: Secondary | ICD-10-CM

## 2016-07-09 DIAGNOSIS — B20 Human immunodeficiency virus [HIV] disease: Secondary | ICD-10-CM

## 2016-07-09 DIAGNOSIS — Z113 Encounter for screening for infections with a predominantly sexual mode of transmission: Secondary | ICD-10-CM

## 2016-07-09 DIAGNOSIS — N181 Chronic kidney disease, stage 1: Secondary | ICD-10-CM

## 2016-07-09 DIAGNOSIS — E1122 Type 2 diabetes mellitus with diabetic chronic kidney disease: Secondary | ICD-10-CM

## 2016-07-09 DIAGNOSIS — IMO0002 Reserved for concepts with insufficient information to code with codable children: Secondary | ICD-10-CM

## 2016-07-09 DIAGNOSIS — E1165 Type 2 diabetes mellitus with hyperglycemia: Secondary | ICD-10-CM

## 2016-07-09 LAB — COMPREHENSIVE METABOLIC PANEL
ALBUMIN: 3.9 g/dL (ref 3.6–5.1)
ALT: 10 U/L (ref 9–46)
AST: 12 U/L (ref 10–40)
Alkaline Phosphatase: 59 U/L (ref 40–115)
BILIRUBIN TOTAL: 0.4 mg/dL (ref 0.2–1.2)
BUN: 9 mg/dL (ref 7–25)
CALCIUM: 9 mg/dL (ref 8.6–10.3)
CHLORIDE: 106 mmol/L (ref 98–110)
CO2: 22 mmol/L (ref 20–31)
CREATININE: 0.95 mg/dL (ref 0.60–1.35)
Glucose, Bld: 197 mg/dL — ABNORMAL HIGH (ref 65–99)
Potassium: 4.2 mmol/L (ref 3.5–5.3)
SODIUM: 138 mmol/L (ref 135–146)
TOTAL PROTEIN: 6.8 g/dL (ref 6.1–8.1)

## 2016-07-09 LAB — CBC
HEMATOCRIT: 37.1 % — AB (ref 38.5–50.0)
HEMOGLOBIN: 12.5 g/dL — AB (ref 13.2–17.1)
MCH: 32.7 pg (ref 27.0–33.0)
MCHC: 33.7 g/dL (ref 32.0–36.0)
MCV: 97.1 fL (ref 80.0–100.0)
MPV: 10.3 fL (ref 7.5–12.5)
Platelets: 273 10*3/uL (ref 140–400)
RBC: 3.82 MIL/uL — AB (ref 4.20–5.80)
RDW: 13.7 % (ref 11.0–15.0)
WBC: 8.4 10*3/uL (ref 3.8–10.8)

## 2016-07-09 LAB — LIPID PANEL
CHOLESTEROL: 165 mg/dL (ref <200)
HDL: 39 mg/dL — AB (ref >40)
LDL Cholesterol: 89 mg/dL (ref <100)
Total CHOL/HDL Ratio: 4.2 Ratio (ref <5.0)
Triglycerides: 186 mg/dL — ABNORMAL HIGH (ref <150)
VLDL: 37 mg/dL — ABNORMAL HIGH (ref <30)

## 2016-07-10 LAB — RPR

## 2016-07-10 LAB — T-HELPER CELL (CD4) - (RCID CLINIC ONLY)
CD4 % Helper T Cell: 11 % — ABNORMAL LOW (ref 33–55)
CD4 T Cell Abs: 270 /uL — ABNORMAL LOW (ref 400–2700)

## 2016-07-16 LAB — HIV-1 RNA QUANT-NO REFLEX-BLD
HIV 1 RNA Quant: <20 copies/mL
HIV-1 RNA Quant, Log: <1.3 Log copies/mL

## 2016-07-23 ENCOUNTER — Encounter: Payer: Self-pay | Admitting: Infectious Diseases

## 2016-07-23 ENCOUNTER — Ambulatory Visit (INDEPENDENT_AMBULATORY_CARE_PROVIDER_SITE_OTHER): Payer: 59 | Admitting: Infectious Diseases

## 2016-07-23 DIAGNOSIS — G4700 Insomnia, unspecified: Secondary | ICD-10-CM

## 2016-07-23 DIAGNOSIS — B20 Human immunodeficiency virus [HIV] disease: Secondary | ICD-10-CM | POA: Diagnosis not present

## 2016-07-23 DIAGNOSIS — E1165 Type 2 diabetes mellitus with hyperglycemia: Secondary | ICD-10-CM

## 2016-07-23 DIAGNOSIS — E1122 Type 2 diabetes mellitus with diabetic chronic kidney disease: Secondary | ICD-10-CM | POA: Diagnosis not present

## 2016-07-23 DIAGNOSIS — N181 Chronic kidney disease, stage 1: Secondary | ICD-10-CM

## 2016-07-23 DIAGNOSIS — F329 Major depressive disorder, single episode, unspecified: Secondary | ICD-10-CM

## 2016-07-23 DIAGNOSIS — IMO0002 Reserved for concepts with insufficient information to code with codable children: Secondary | ICD-10-CM

## 2016-07-23 DIAGNOSIS — F32A Depression, unspecified: Secondary | ICD-10-CM

## 2016-07-23 MED ORDER — VENLAFAXINE HCL ER 37.5 MG PO CP24: 37.5000 mg | ORAL_CAPSULE | Freq: Every day | ORAL | 3 refills | Status: DC

## 2016-07-23 MED ORDER — TRAZODONE HCL 50 MG PO TABS: 25.0000 mg | ORAL_TABLET | Freq: Every evening | ORAL | 1 refills | Status: DC | PRN

## 2016-07-23 NOTE — Assessment & Plan Note (Signed)
Start on low dose trazadone Appreciate Mr Brett Walsh discussing sleep hygiene

## 2016-07-23 NOTE — Progress Notes (Signed)
   Subjective:    Patient ID: Brett Walsh, male    DOB: 1966-04-22, 51 y.o.   MRN: [id number]  HPI 51 yo M with hx of HIV+ since 2008 (when he was hospitalized with pneumonia), on DTGV/TRV (was on atripla prior, developed resistance), also hx of DM2 (2012) on metformin.  He was seen in f/u in ID clinic on 06-20-14 and started on triumeq. No problems with ART.  Last A1C was 14%. Rx was changed (exenatide). Decreased appetite.    HIV 1 RNA Quant (copies/mL)  Date Value  07/09/2016 <20 NOT DETECTED  01/07/2016 48 (H)  07/12/2015 <20   CD4 T Cell Abs (/uL)  Date Value  07/09/2016 270 (L)  01/07/2016 290 (L)  07/12/2015 210 (L)   Has concerns about depression, sleep today. Is overwhelmed with bills, room mates. Has not been cleaning appt.  Meeting wth Bernette Redbirdkenny. Has taken zoloft prior, did not help.  He will have f/u appt with Bernette RedbirdKenny.   Review of Systems  Constitutional: Positive for appetite change. Negative for unexpected weight change.  Gastrointestinal: Positive for diarrhea. Negative for constipation.  Genitourinary: Negative for difficulty urinating.  Skin: Negative for wound.  Psychiatric/Behavioral: Positive for dysphoric mood and sleep disturbance. Negative for self-injury and suicidal ideas.  solid BM for 1 day last week.  Has been seen at [facility]. Starting fiber/laxatives.  Please see HPI. 12 point ROS o/w (-)     Objective:   Physical Exam  Constitutional: He appears well-developed and well-nourished.  HENT:  Mouth/Throat: No oropharyngeal exudate.  Eyes: EOM are normal. Pupils are equal, round, and reactive to light.  Neck: Neck supple.  Cardiovascular: Normal rate, regular rhythm and normal heart sounds.   Pulmonary/Chest: Effort normal and breath sounds normal.  Abdominal: Soft. Bowel sounds are normal. There is no tenderness. There is no rebound.  Musculoskeletal: He exhibits no edema.  Lymphadenopathy:    He has no cervical adenopathy.      Assessment &  Plan:

## 2016-07-23 NOTE — Assessment & Plan Note (Signed)
Will start him on low dose effexor.  rtc with kenny in 2 weeks.  rtc with me in 4 months

## 2016-07-23 NOTE — Assessment & Plan Note (Signed)
He is doing well.  Had flu shot at work.  Defer mening vax.  Offered/refused condoms.  rtc in 4 months.

## 2016-07-23 NOTE — Assessment & Plan Note (Signed)
Working on better control of DM Cr normal.  Lipids good.  No optho since 2016.

## 2016-08-04 ENCOUNTER — Ambulatory Visit: Payer: 59

## 2016-08-04 DIAGNOSIS — F4323 Adjustment disorder with mixed anxiety and depressed mood: Secondary | ICD-10-CM

## 2016-08-04 NOTE — BH Specialist Note (Signed)
I met with Brett Walsh for the first time today after seeing him for a brief "warm handoff" 2 weeks ago. He reports he is taking Effexor now and coworkers say he is better. He still struggles with sleep. He is breaking the 50 mg trazadone in half. I pointed out that the prescription is for a full 50 mg. I gave him several tips on sleep. He talked about his tendency to take care of other people at his own expense and he is doing that with a roommate who needs help. I talked with him about setting better boundaries. I also provided psycho-education on mindfulness meditation. Plan is for him to call and reschedule when he knows his work schedule. Curley Spice, LCSW

## 2016-08-19 ENCOUNTER — Encounter: Payer: Self-pay | Admitting: Family

## 2016-08-19 ENCOUNTER — Ambulatory Visit (INDEPENDENT_AMBULATORY_CARE_PROVIDER_SITE_OTHER): Payer: 59 | Admitting: Family

## 2016-08-19 VITALS — BP 114/72 | HR 108 | Temp 98.0°F | Resp 16 | Ht 67.0 in | Wt 204.8 lb

## 2016-08-19 DIAGNOSIS — E1122 Type 2 diabetes mellitus with diabetic chronic kidney disease: Secondary | ICD-10-CM

## 2016-08-19 DIAGNOSIS — N181 Chronic kidney disease, stage 1: Secondary | ICD-10-CM | POA: Diagnosis not present

## 2016-08-19 DIAGNOSIS — IMO0002 Reserved for concepts with insufficient information to code with codable children: Secondary | ICD-10-CM

## 2016-08-19 DIAGNOSIS — E1165 Type 2 diabetes mellitus with hyperglycemia: Secondary | ICD-10-CM

## 2016-08-19 LAB — POCT GLYCOSYLATED HEMOGLOBIN (HGB A1C): HEMOGLOBIN A1C: 9

## 2016-08-19 NOTE — Patient Instructions (Signed)
Thank you for choosing [facility].  SUMMARY AND INSTRUCTIONS:  Please continue to take the Bydureon and Metformin.   Start taking Jardiance.  Please check on the price of Jardiance, Farxiga, and Invokana.   Medication:  Your prescription(s) have been submitted to your pharmacy or been printed and provided for you. Please take as directed and contact our office if you believe you are having problem(s) with the medication(s) or have any questions.  Follow up:  If your symptoms worsen or fail to improve, please contact our office for further instruction, or in case of emergency go directly to the emergency room at the closest medical facility.

## 2016-08-19 NOTE — Assessment & Plan Note (Addendum)
In office point care A1c 9.0 significantly improved from 14.9 with current medication regimen and minimal side effects. No hypoglycemic readings. Not maintained on statin or Ace/ARB for CAD risk reduction. Diabetic foot exam completed today. Encouraged complete diabetic eye exam independently. Continue current dosage of metformin and Bydureon. Consider change to Abbott LaboratoriesBydureon B-Cise. Will check on affordability of SGLT medications. Continue to monitor blood sugars at home 1-2 times daily. Recommend carbohydrate modified diet. Continue to monitor.

## 2016-08-19 NOTE — Progress Notes (Signed)
Subjective:    Patient ID: Brett Walsh, male    DOB: [birth year], 51 y.o.   MRN: [id number]  Chief Complaint  Patient presents with  . Follow-up    diabetes     HPI:  Brett Marionverardo Koerber is a 51 y.o. male who  has a past medical history of Deviated septum; Diabetes mellitus without complication (HCC); and HIV disease (HCC). and presents today for a follow up office.    Diabetes - Currently maintained on metformin and Bydureon. Reports taking the medication as prescribed and notes occasional subcutaneous bumps following injection of the Byderon that go away after a period of time. Denies hypoglycemic readings. Blood sugars at home have been improved and continues to monitor 1-2 times per day. Denies new symptoms of end organ damage. No excessive hunger, thirst, or urination. Working on a low/carbohydrate modified oral intake.   Lab Results  Component Value Date   HGBA1C 9.0 08/19/2016     Lab Results  Component Value Date   CREATININE 0.95 07/09/2016   BUN 9 07/09/2016   NA 138 07/09/2016   K 4.2 07/09/2016   CL 106 07/09/2016   CO2 22 07/09/2016    No Known Allergies    Outpatient Medications Prior to Visit  Medication Sig Dispense Refill  . Exenatide ER (BYDUREON) 2 MG PEN Inject 2 mg into the skin once a week. 4 each 2  . metFORMIN (GLUCOPHAGE) 500 MG tablet Take 1 tablet (500 mg total) by mouth daily. 500 mg in am, 1500 mg in pm 120 tablet 5  . traZODone (DESYREL) 50 MG tablet Take 0.5 tablets (25 mg total) by mouth at bedtime as needed for sleep. 30 tablet 1  . TRIUMEQ 600-50-300 MG tablet TAKE 1 TABLET BY MOUTH DAILY 90 tablet 5  . venlafaxine XR (EFFEXOR-XR) 37.5 MG 24 hr capsule Take 1 capsule (37.5 mg total) by mouth daily with breakfast. 30 capsule 3  . acyclovir ointment (ZOVIRAX) 5 %     . trimethoprim-polymyxin b (POLYTRIM) ophthalmic solution      No facility-administered medications prior to visit.     Review of Systems  Eyes:       Negative for  changes in vision.  Respiratory: Negative for chest tightness and shortness of breath.   Cardiovascular: Negative for chest pain, palpitations and leg swelling.  Endocrine: Negative for polydipsia, polyphagia and polyuria.  Neurological: Negative for dizziness, weakness, light-headedness and headaches.      Objective:    BP 114/72 (BP Location: Left Arm, Patient Position: Sitting, Cuff Size: Large)   Pulse (!) 108   Temp 98 F (36.7 C) (Oral)   Resp 16   Ht 5\' 7"  (1.702 m)   Wt 204 lb 12.8 oz (92.9 kg)   SpO2 98%   BMI 32.08 kg/m  Nursing note and vital signs reviewed.  Physical Exam  Constitutional: He is oriented to person, place, and time. He appears well-developed and well-nourished. No distress.  Cardiovascular: Normal rate, regular rhythm, normal heart sounds and intact distal pulses.   Pulmonary/Chest: Effort normal and breath sounds normal.  Neurological: He is alert and oriented to person, place, and time.  Diabetic foot exam - bilateral feet are free from skin breakdown, cuts, and abrasions. Toenails are neatly trimmed. Pulses are intact and appropriate. Sensation is intact to monofilament bilaterally.  Skin: Skin is warm and dry.  Psychiatric: He has a normal mood and affect. His behavior is normal. Judgment and thought content normal.  Assessment & Plan:   Problem List Items Addressed This Visit      Endocrine   Diabetes type 2, uncontrolled (HCC) - Primary    In office point care A1c 9.0 significantly improved from 14.9 with current medication regimen and minimal side effects. No hypoglycemic readings. Not maintained on statin or Ace/ARB for CAD risk reduction. Diabetic foot exam completed today. Encouraged complete diabetic eye exam independently. Continue current dosage of metformin and Bydureon. Consider change to [employer]. Will check on affordability of SGLT medications. Continue to monitor blood sugars at home 1-2 times daily. Recommend carbohydrate  modified diet. Continue to monitor.      Relevant Orders   POCT HgB A1C (Completed)       I have discontinued Mr. Valverde acyclovir ointment and trimethoprim-polymyxin b. I am also having him maintain his TRIUMEQ, Exenatide ER, metFORMIN, venlafaxine XR, and traZODone.   Follow-up: Return in about 3 months (around 11/19/2016), or if symptoms worsen or fail to improve.  Jeanine Luz, FNP

## 2016-08-21 ENCOUNTER — Encounter: Payer: Self-pay | Admitting: Infectious Diseases

## 2016-08-21 NOTE — Telephone Encounter (Signed)
RN spoke with patient. He has not been out of bed all week, reports severe depression. He does not have any suicidal ideation. He reports increasing depression since starting the effexor 1 month ago.  He has not wanted to make the effort to call for an appointment with Bernette RedbirdKenny. He is willing to come in to counseling.  He states that he has called in sick to work all week, but now feels like he should go back. He will need a note for this week. RN advised that Bernette RedbirdKenny is not in the office tomorrow, but gave the patient the [facility].  He will call to see if he can see Bernette RedbirdKenny at their location, or maybe another provider.  He will come to clinic next week for appointment with Bernette RedbirdKenny. Please advise regarding medication and note for work. Andree CossHowell, Otelia Hettinger M, RN

## 2016-08-22 ENCOUNTER — Encounter: Payer: Self-pay | Admitting: Infectious Diseases

## 2016-10-14 ENCOUNTER — Ambulatory Visit (INDEPENDENT_AMBULATORY_CARE_PROVIDER_SITE_OTHER): Payer: 59 | Admitting: Family

## 2016-10-14 ENCOUNTER — Encounter: Payer: Self-pay | Admitting: Family

## 2016-10-14 VITALS — BP 120/74 | HR 105 | Temp 98.2°F | Resp 16 | Ht 67.0 in | Wt 230.0 lb

## 2016-10-14 DIAGNOSIS — E1165 Type 2 diabetes mellitus with hyperglycemia: Secondary | ICD-10-CM

## 2016-10-14 DIAGNOSIS — E1122 Type 2 diabetes mellitus with diabetic chronic kidney disease: Secondary | ICD-10-CM | POA: Diagnosis not present

## 2016-10-14 DIAGNOSIS — T887XXA Unspecified adverse effect of drug or medicament, initial encounter: Secondary | ICD-10-CM | POA: Diagnosis not present

## 2016-10-14 DIAGNOSIS — IMO0002 Reserved for concepts with insufficient information to code with codable children: Secondary | ICD-10-CM

## 2016-10-14 DIAGNOSIS — T50905A Adverse effect of unspecified drugs, medicaments and biological substances, initial encounter: Secondary | ICD-10-CM | POA: Insufficient documentation

## 2016-10-14 DIAGNOSIS — N181 Chronic kidney disease, stage 1: Secondary | ICD-10-CM | POA: Diagnosis not present

## 2016-10-14 MED ORDER — ONETOUCH SURESOFT LANCING DEV MISC: 1 refills | Status: AC

## 2016-10-14 MED ORDER — GLUCOSE BLOOD VI STRP: ORAL_STRIP | 12 refills | Status: DC

## 2016-10-14 MED ORDER — ONETOUCH ULTRA MINI W/DEVICE KIT: PACK | 0 refills | Status: AC

## 2016-10-14 NOTE — Assessment & Plan Note (Signed)
New onset medication reaction of nodules at the site of injection of Bydureon. Reassured these are benign and generally resolve within about 4 weeks without any treatment. Advised to seek care if becomes infected or does not go away. Continue current dosage of Bydureon.

## 2016-10-14 NOTE — Patient Instructions (Signed)
Thank you for choosing [facility].  SUMMARY AND INSTRUCTIONS:  Please continue to take your medications as prescribed.   Follow up in 1 month for your A1c.  We have sent in a prescription for a meter, strips and supplies.   Please check blood sugars 1-2 times per day at different times.   Follow up:  If your symptoms worsen or fail to improve, please contact our office for further instruction, or in case of emergency go directly to the emergency room at the closest medical facility.

## 2016-10-14 NOTE — Progress Notes (Signed)
Subjective:    Patient ID: Brett Walsh, male    DOB: [birth year], 50 y.o.   MRN: [id number]  Chief Complaint  Patient presents with  . Follow-up    has places on stomach that are caused by bydureon    HPI:  Brett Walsh is a 51 y.o. male who  has a past medical history of Deviated septum; Diabetes mellitus without complication ([place]); and HIV disease ([place]). and presents today for a follow up office visit.   Diabetes - Currently maintained on Metformin and Bydureon. Reports taking the medications as prescribed and notes the adverse side effect of a nodule located at the injection sites of his Bydureon that primarily occurs in the abdomen. Denies fevers, chills, or obvious signs of infection. Sites generally without pain or complication.   No Known Allergies    Outpatient Medications Prior to Visit  Medication Sig Dispense Refill  . Exenatide ER (BYDUREON) 2 MG PEN Inject 2 mg into the skin once a week. 4 each 2  . metFORMIN (GLUCOPHAGE) 500 MG tablet Take 1 tablet (500 mg total) by mouth daily. 500 mg in am, 1500 mg in pm 120 tablet 5  . TRIUMEQ 600-50-300 MG tablet TAKE 1 TABLET BY MOUTH DAILY 90 tablet 5  . traZODone (DESYREL) 50 MG tablet Take 0.5 tablets (25 mg total) by mouth at bedtime as needed for sleep. 30 tablet 1  . venlafaxine XR (EFFEXOR-XR) 37.5 MG 24 hr capsule Take 1 capsule (37.5 mg total) by mouth daily with breakfast. 30 capsule 3   No facility-administered medications prior to visit.      Review of Systems  Eyes:       Negative for changes in vision.  Respiratory: Negative for chest tightness and shortness of breath.   Cardiovascular: Negative for chest pain, palpitations and leg swelling.  Endocrine: Negative for polydipsia, polyphagia and polyuria.  Neurological: Negative for dizziness, weakness, light-headedness and headaches.      Objective:    BP 120/74 (BP Location: Left Arm, Patient Position: Sitting, Cuff Size: Large)   Pulse (!) 105    Temp 98.2 F (36.8 C) (Oral)   Resp 16   Ht 5' 7"  (1.702 m)   Wt 230 lb (104.3 kg)   SpO2 97%   BMI 36.02 kg/m  Nursing note and vital signs reviewed.  Physical Exam  Constitutional: He is oriented to person, place, and time. He appears well-developed and well-nourished. No distress.  Cardiovascular: Normal rate, regular rhythm, normal heart sounds and intact distal pulses.   Pulmonary/Chest: Effort normal and breath sounds normal.  Abdominal: Normal appearance and bowel sounds are normal. He exhibits no mass. There is no hepatosplenomegaly. There is no tenderness. There is no rigidity, no rebound, no guarding, no tenderness at McBurney's point and negative Murphy's sign.  Neurological: He is alert and oriented to person, place, and time.  Skin: Skin is warm and dry.  Psychiatric: He has a normal mood and affect. His behavior is normal. Judgment and thought content normal.       Assessment & Plan:   Problem List Items Addressed This Visit      Endocrine   Diabetes type 2, uncontrolled ([place])   Relevant Medications   Blood Glucose Monitoring Suppl (ONE TOUCH ULTRA MINI) w/Device KIT   glucose blood (ONE TOUCH ULTRA TEST) test strip   Lancets Misc. (ONE TOUCH SURESOFT) MISC     Other   Medication reaction - Primary    New onset medication reaction of  nodules at the site of injection of Bydureon. Reassured these are benign and generally resolve within about 4 weeks without any treatment. Advised to seek care if becomes infected or does not go away. Continue current dosage of Bydureon.           I have discontinued Mr. Rockett venlafaxine XR and traZODone. I am also having him start on ONE TOUCH ULTRA MINI, glucose blood, and ONE TOUCH SURESOFT. Additionally, I am having him maintain his TRIUMEQ, Exenatide ER, and metFORMIN.   Meds ordered this encounter  Medications  . Blood Glucose Monitoring Suppl (ONE TOUCH ULTRA MINI) w/Device KIT    Sig: Use meter to check blood  sugars 1-4 times daily as instructed.    Dispense:  1 each    Refill:  0    Substitution permissible per insurance coverage. Dx E11.9.    Order Specific Question:   Supervising Provider    Answer:   Pricilla Holm A [[id number]]  . glucose blood (ONE TOUCH ULTRA TEST) test strip    Sig: Use one strip per test. Test blood sugars 1-4 times daily as instructed.    Dispense:  100 each    Refill:  12    Substitution permissible per insurance coverage. Dx E11.9.    Order Specific Question:   Supervising Provider    Answer:   Pricilla Holm A [[id number]]  . Lancets Misc. (ONE TOUCH SURESOFT) MISC    Sig: Use 1 lancet per test. Test blood sugars 1-4 times per day as instructed.    Dispense:  1 each    Refill:  1    Substitution permissible per insurance coverage. Dx E11.9.    Order Specific Question:   Supervising Provider    Answer:   Pricilla Holm A [[id number]]     Follow-up: Return in about 1 month (around 11/14/2016), or if symptoms worsen or fail to improve.  Mauricio Po, FNP

## 2016-10-15 ENCOUNTER — Other Ambulatory Visit: Payer: Self-pay | Admitting: *Deleted

## 2016-10-15 DIAGNOSIS — B20 Human immunodeficiency virus [HIV] disease: Secondary | ICD-10-CM

## 2016-10-15 MED ORDER — ABACAVIR-DOLUTEGRAVIR-LAMIVUD 600-50-300 MG PO TABS: 1.0000 | ORAL_TABLET | Freq: Every day | ORAL | 3 refills | Status: DC

## 2016-11-18 ENCOUNTER — Emergency Department (HOSPITAL_COMMUNITY): Payer: 59

## 2016-11-18 ENCOUNTER — Encounter (HOSPITAL_COMMUNITY): Payer: Self-pay

## 2016-11-18 ENCOUNTER — Emergency Department (HOSPITAL_COMMUNITY)
Admission: EM | Admit: 2016-11-18 | Discharge: 2016-11-18 | Disposition: A | Discharge status: Home / Self Care | Payer: 59 | Attending: Emergency Medicine | Admitting: Emergency Medicine

## 2016-11-18 DIAGNOSIS — E119 Type 2 diabetes mellitus without complications: Secondary | ICD-10-CM | POA: Diagnosis not present

## 2016-11-18 DIAGNOSIS — R072 Precordial pain: Secondary | ICD-10-CM | POA: Diagnosis not present

## 2016-11-18 DIAGNOSIS — F17210 Nicotine dependence, cigarettes, uncomplicated: Secondary | ICD-10-CM | POA: Insufficient documentation

## 2016-11-18 DIAGNOSIS — Z794 Long term (current) use of insulin: Secondary | ICD-10-CM | POA: Diagnosis not present

## 2016-11-18 DIAGNOSIS — R079 Chest pain, unspecified: Secondary | ICD-10-CM | POA: Diagnosis not present

## 2016-11-18 LAB — CBC
HCT: 40.4 % (ref 39.0–52.0)
Hemoglobin: 13.8 g/dL (ref 13.0–17.0)
MCH: 33.3 pg (ref 26.0–34.0)
MCHC: 34.2 g/dL (ref 30.0–36.0)
MCV: 97.3 fL (ref 78.0–100.0)
PLATELETS: 289 10*3/uL (ref 150–400)
RBC: 4.15 MIL/uL — AB (ref 4.22–5.81)
RDW: 13 % (ref 11.5–15.5)
WBC: 11.5 10*3/uL — AB (ref 4.0–10.5)

## 2016-11-18 LAB — BASIC METABOLIC PANEL
Anion gap: 9 (ref 5–15)
BUN: 21 mg/dL — ABNORMAL HIGH (ref 6–20)
CALCIUM: 9.1 mg/dL (ref 8.9–10.3)
CO2: 25 mmol/L (ref 22–32)
CREATININE: 0.97 mg/dL (ref 0.61–1.24)
Chloride: 102 mmol/L (ref 101–111)
GFR calc non Af Amer: >60 mL/min (ref >60)
Glucose, Bld: 146 mg/dL — ABNORMAL HIGH (ref 65–99)
Potassium: 4.2 mmol/L (ref 3.5–5.1)
Sodium: 136 mmol/L (ref 135–145)

## 2016-11-18 LAB — TROPONIN I: Troponin I: <0.03 ng/mL (ref <0.03)

## 2016-11-18 NOTE — Discharge Instructions (Signed)

## 2016-11-18 NOTE — ED Triage Notes (Signed)
Pt endorses non radiating chest tightness that began this evening while laying down. Pt complains of heart burn that has been going on all day but turned into tightness tonight with diaphoresis and nausea. Denis shob or vomiting.

## 2016-11-19 NOTE — ED Provider Notes (Signed)
[facility] DEPT Provider Note   CSN: [id number] Arrival date & time: 11/18/16  0201     History   Chief Complaint Chief Complaint  Patient presents with  . Chest Pain    HPI Brett Walsh is a 51 y.o. male.  HPI  Pt with hx of DM, HIV comes in with cc of chest pain. Pt reports that he started having chest pain this evening that is midsternal and non radiating. The pain is tightness like and  at one point he had diaphoresis and nausea Pt had no dib. Currently, he has no chest pain. Pt has no hx of CAD.  Past Medical History:  Diagnosis Date  . Deviated septum   . Diabetes mellitus without complication ([place])   . HIV disease [facility])     Patient Active Problem List   Diagnosis Date Noted  . Medication reaction 10/14/2016  . Depression (emotion) 07/23/2016  . Insomnia 07/23/2016  . Routine general medical examination at a health care facility 06/20/2016  . TMJ (temporomandibular joint syndrome) 01/21/2016  . HIV disease ([place]) 06/20/2014  . Diabetes type 2, uncontrolled ([place]) 06/20/2014    History reviewed. No pertinent surgical history.     Home Medications    Prior to Admission medications   Medication Sig Start Date End Date Taking? Authorizing Provider  abacavir-dolutegravir-lamiVUDine (TRIUMEQ) 600-50-300 MG tablet Take 1 tablet by mouth daily. 10/15/16  Yes Campbell Riches, MD  Exenatide ER (BYDUREON) 2 MG PEN Inject 2 mg into the skin once a week. Patient taking differently: Inject 2 mg into the skin once a week. On Monday 05/22/16  Yes Golden Circle, FNP  loratadine (CLARITIN) 10 MG tablet Take 10 mg by mouth daily as needed for allergies.   Yes [provider]  metFORMIN (GLUCOPHAGE) 500 MG tablet Take 1 tablet (500 mg total) by mouth daily. 500 mg in am, 1500 mg in pm Patient taking differently: Take 500-2,000 mg by mouth See admin instructions. Take 1 tablet every morning and take 4 tablets every evening 06/11/16  Yes Golden Circle,  FNP  Blood Glucose Monitoring Suppl (ONE TOUCH ULTRA MINI) w/Device KIT Use meter to check blood sugars 1-4 times daily as instructed. 10/14/16   Golden Circle, FNP  glucose blood (ONE TOUCH ULTRA TEST) test strip Use one strip per test. Test blood sugars 1-4 times daily as instructed. 10/14/16   Golden Circle, FNP  Lancets Misc. (ONE TOUCH SURESOFT) MISC Use 1 lancet per test. Test blood sugars 1-4 times per day as instructed. 10/14/16   Golden Circle, FNP    Family History Family History  Problem Relation Age of Onset  . Diabetes Brother   . Diabetes Mother   . Diabetes Father   . Heart failure Father     Social History Social History  Substance Use Topics  . Smoking status: Light Tobacco Smoker    Packs/day: 0.50    Years: 36.00    Types: Cigarettes, E-cigarettes  . Smokeless tobacco: Current User  . Alcohol use 0.6 oz/week    1 Standard drinks or equivalent per week     Comment: socially     Allergies   Patient has no known allergies.   Review of Systems Review of Systems  All other systems reviewed and are negative.    Physical Exam Updated Vital Signs BP 130/89   Pulse 86   Temp 98 F (36.7 C) (Oral)   Resp 17   Ht _0  (1.702 m)  Wt 102.5 kg (226 lb)   SpO2 100%   BMI 35.40 kg/m   Physical Exam  Constitutional: He is oriented to person, place, and time. He appears well-developed.  HENT:  Head: Normocephalic and atraumatic.  Eyes: Conjunctivae and EOM are normal. Pupils are equal, round, and reactive to light.  Neck: Normal range of motion. Neck supple.  Cardiovascular: Normal rate, regular rhythm and intact distal pulses.   Pulmonary/Chest: Effort normal and breath sounds normal. No respiratory distress. He has no wheezes.  Abdominal: Soft. Bowel sounds are normal. He exhibits no distension and no mass. There is no tenderness. There is no rebound and no guarding.  Musculoskeletal: He exhibits no deformity.  Neurological: He is alert and  oriented to person, place, and time.  Skin: Skin is warm.  Nursing note and vitals reviewed.    ED Treatments / Results  Labs (all labs ordered are listed, but only abnormal results are displayed) Labs Reviewed  BASIC METABOLIC PANEL - Abnormal; Notable for the following:       Result Value   Glucose, Bld 146 (*)    BUN 21 (*)    All other components within normal limits  CBC - Abnormal; Notable for the following:    WBC 11.5 (*)    RBC 4.15 (*)    All other components within normal limits  TROPONIN I  TROPONIN I    EKG  EKG Interpretation  Date/Time:  Tuesday November 18 2016 02:07:23 EDT Ventricular Rate:  92 PR Interval:  158 QRS Duration: 94 QT Interval:  344 QTC Calculation: 425 R Axis:   60 Text Interpretation:  Normal sinus rhythm Normal ECG No acute changes No old tracing to compare Confirmed by Varney Biles [phone]) on 11/18/2016 5:03:00 AM       Radiology Dg Chest 2 View  Result Date: 11/18/2016 CLINICAL DATA:  Mid chest pain tonight. EXAM: CHEST  2 VIEW COMPARISON:  None. FINDINGS: Low lung volumes limit assessment. EKG leads project over the chest. The cardiomediastinal contours are normal. Pulmonary vasculature is normal. No consolidation, pleural effusion, or pneumothorax. No acute osseous abnormalities are seen. IMPRESSION: Low lung volumes without acute abnormality. Electronically Signed   By: Jeb Levering M.D.   On: 11/18/2016 02:41    Procedures Procedures (including critical care time)  Medications Ordered in ED Medications - No data to display   Initial Impression / Assessment and Plan / ED Course  I have reviewed the triage vital signs and the nursing notes.  Pertinent labs & imaging results that were available during my care of the patient were reviewed by me and considered in my medical decision making (see chart for details).     Pt comes in with cc of chest pain. He has hx of DM, advanced HIV  Differential diagnosis includes: ACS  syndrome Aortic dissection CHF exacerbation Valvular disorder Myocarditis Pericarditis Endocarditis Pericardial effusion / tamponade Pneumonia Pleural effusion / Pulmonary edema PE Pneumothorax Musculoskeletal pain PUD / Gastritis / Esophagitis Esophageal spasm   Based on the hx and exam - the pain likely is esophageal spasm type pain or ACS related pain. HEART score is < 4 for patient, and he is agreeable to close f/u with Cardiology. I dont suspect opportunistic infections at this time. . Strict ER return precautions have been discussed, and patient is agreeing with the plan and is comfortable with the workup done and the recommendations from the ER.   Final Clinical Impressions(s) / ED Diagnoses   Final diagnoses:  Precordial pain    New Prescriptions Discharge Medication List as of 11/18/2016 10:08 AM       Varney Biles, MD 11/19/16 [phone]

## 2016-12-02 ENCOUNTER — Ambulatory Visit (INDEPENDENT_AMBULATORY_CARE_PROVIDER_SITE_OTHER): Payer: 59 | Admitting: Family

## 2016-12-02 ENCOUNTER — Encounter: Payer: Self-pay | Admitting: Family

## 2016-12-02 VITALS — BP 120/76 | HR 102 | Temp 98.1°F | Resp 16 | Ht 67.0 in | Wt 236.4 lb

## 2016-12-02 DIAGNOSIS — E1122 Type 2 diabetes mellitus with diabetic chronic kidney disease: Secondary | ICD-10-CM

## 2016-12-02 DIAGNOSIS — K2100 Gastro-esophageal reflux disease with esophagitis, without bleeding: Secondary | ICD-10-CM

## 2016-12-02 DIAGNOSIS — N181 Chronic kidney disease, stage 1: Secondary | ICD-10-CM | POA: Diagnosis not present

## 2016-12-02 DIAGNOSIS — IMO0002 Reserved for concepts with insufficient information to code with codable children: Secondary | ICD-10-CM

## 2016-12-02 DIAGNOSIS — E1165 Type 2 diabetes mellitus with hyperglycemia: Secondary | ICD-10-CM

## 2016-12-02 DIAGNOSIS — K219 Gastro-esophageal reflux disease without esophagitis: Secondary | ICD-10-CM | POA: Insufficient documentation

## 2016-12-02 LAB — POCT GLYCOSYLATED HEMOGLOBIN (HGB A1C): Hemoglobin A1C: 5.8

## 2016-12-02 MED ORDER — PANTOPRAZOLE SODIUM 40 MG PO TBEC: 40.0000 mg | DELAYED_RELEASE_TABLET | Freq: Every day | ORAL | 3 refills | Status: DC

## 2016-12-02 NOTE — Assessment & Plan Note (Signed)
New onset chest pain appears consistent with GERD. Start pantoprazole. Information on GERD diet provided in AVS. Previous blood and cardiac work up negative. Follow up pending trail of medication.

## 2016-12-02 NOTE — Progress Notes (Signed)
Subjective:    Patient ID: Brett Walsh, male    DOB: 03-25-1966, 51 y.o.   MRN: [id number]  Chief Complaint  Patient presents with  . Hospitalization Follow-up    was told at the hospital to check for GERD    HPI:  Brett Walsh is a 51 y.o. male who  has a past medical history of Deviated septum; Diabetes mellitus without complication ([place]); and HIV disease ([place]). and presents today for a follow up office visit.   1.) Diabetes - Currently maintained on metformin and Bydureon. Reports taking the medication as prescribed and denies adverse side effects or hypoglycemic readings. Blood sugars at home have ranged around the 150's . Denies new symptoms of end organ damage. No excessive hunger, thirst, or urination. Working on a low/carbohydrate modified oral intake.   Lab Results  Component Value Date   HGBA1C 5.8 12/02/2016     Lab Results  Component Value Date   CREATININE 0.97 11/18/2016   BUN 21 (H) 11/18/2016   NA 136 11/18/2016   K 4.2 11/18/2016   CL 102 11/18/2016   CO2 25 11/18/2016    2.) Chest pain - This is a new problem. Recently evaluated in the ED the associated symptom of pericordial chest pain with concern for ACS. All blood work was negative for cardiac origin and was diagnosed with GERD and instructed to follow up with primary care. Describes symptoms as a fullness and does have a cough when lying down with the timing of the symptoms being worse at night. Denies any modifying factors or attempted treatments.   No Known Allergies    Outpatient Medications Prior to Visit  Medication Sig Dispense Refill  . abacavir-dolutegravir-lamiVUDine (TRIUMEQ) 600-50-300 MG tablet Take 1 tablet by mouth daily. 90 tablet 3  . Blood Glucose Monitoring Suppl (ONE TOUCH ULTRA MINI) w/Device KIT Use meter to check blood sugars 1-4 times daily as instructed. 1 each 0  . Exenatide ER (BYDUREON) 2 MG PEN Inject 2 mg into the skin once a week. (Patient taking differently:  Inject 2 mg into the skin once a week. On Monday) 4 each 2  . glucose blood (ONE TOUCH ULTRA TEST) test strip Use one strip per test. Test blood sugars 1-4 times daily as instructed. 100 each 12  . Lancets Misc. (ONE TOUCH SURESOFT) MISC Use 1 lancet per test. Test blood sugars 1-4 times per day as instructed. 1 each 1  . loratadine (CLARITIN) 10 MG tablet Take 10 mg by mouth daily as needed for allergies.    . metFORMIN (GLUCOPHAGE) 500 MG tablet Take 1 tablet (500 mg total) by mouth daily. 500 mg in am, 1500 mg in pm (Patient taking differently: Take 500-2,000 mg by mouth See admin instructions. Take 1 tablet every morning and take 4 tablets every evening) 120 tablet 5   No facility-administered medications prior to visit.       No past surgical history on file.    Past Medical History:  Diagnosis Date  . Deviated septum   . Diabetes mellitus without complication ([place])   . HIV disease ([place])       Review of Systems  Eyes:       Negative for changes in vision.  Respiratory: Negative for chest tightness and shortness of breath.   Cardiovascular: Positive for chest pain. Negative for palpitations and leg swelling.  Gastrointestinal: Negative for abdominal pain, blood in stool, constipation, diarrhea, nausea and vomiting.  Endocrine: Negative for polydipsia, polyphagia and polyuria.  Neurological: Negative for dizziness, weakness, light-headedness and headaches.      Objective:    BP 120/76 (BP Location: Left Arm, Patient Position: Sitting, Cuff Size: Large)   Pulse (!) 102   Temp 98.1 F (36.7 C) (Oral)   Resp 16   Ht _0  (1.702 m)   Wt 236 lb 6.4 oz (107.2 kg)   SpO2 98%   BMI 37.03 kg/m  Nursing note and vital signs reviewed.  Physical Exam  Constitutional: He is oriented to person, place, and time. He appears well-developed and well-nourished. No distress.  Cardiovascular: Normal rate, regular rhythm, normal heart sounds and intact distal pulses.     Pulmonary/Chest: Effort normal and breath sounds normal.  Abdominal: Soft. Bowel sounds are normal. He exhibits no distension and no mass. There is no tenderness. There is no rebound and no guarding.  Neurological: He is alert and oriented to person, place, and time.  Skin: Skin is warm and dry.  Psychiatric: He has a normal mood and affect. His behavior is normal. Judgment and thought content normal.       Assessment & Plan:   Problem List Items Addressed This Visit      Digestive   GERD (gastroesophageal reflux disease)    New onset chest pain appears consistent with GERD. Start pantoprazole. Information on GERD diet provided in AVS. Previous blood and cardiac work up negative. Follow up pending trail of medication.       Relevant Medications   pantoprazole (PROTONIX) 40 MG tablet     Endocrine   Diabetes type 2, uncontrolled ([place]) - Primary    Type 2 diabetes significantly improved with in office A1c of 5.8. No hypoglycemic readings. Continue current dosage of metformin and Bydureon. Encouraged to complete diabetic eye exam independently and monitor blood sugars at home. Follow up in 3 months or sooner if needed.       Relevant Orders   POCT HgB A1C (Completed)       I am having Mr. Eliot start on pantoprazole. I am also having him maintain his Exenatide ER, metFORMIN, ONE TOUCH ULTRA MINI, glucose blood, ONE TOUCH SURESOFT, abacavir-dolutegravir-lamiVUDine, and loratadine.   Meds ordered this encounter  Medications  . pantoprazole (PROTONIX) 40 MG tablet    Sig: Take 1 tablet (40 mg total) by mouth daily.    Dispense:  30 tablet    Refill:  3    Order Specific Question:   Supervising Provider    Answer:   Pricilla Holm A [[id number]]     Follow-up: Return in about 3 months (around 03/04/2017), or if symptoms worsen or fail to improve.  Brett Po, FNP

## 2016-12-02 NOTE — Patient Instructions (Addendum)
Thank you for choosing [facility].  SUMMARY AND INSTRUCTIONS:  Please continue to take your medications as prescribed.  Continue to work on nutrition and physical activity.  Start the pantoprazole daily for reflux.   Follow up in 3 months.   Medication:  Your prescription(s) have been submitted to your pharmacy or been printed and provided for you. Please take as directed and contact our office if you believe you are having problem(s) with the medication(s) or have any questions.  Follow up:  If your symptoms worsen or fail to improve, please contact our office for further instruction, or in case of emergency go directly to the emergency room at the closest medical facility.     Food Choices for Gastroesophageal Reflux Disease, Adult When you have gastroesophageal reflux disease (GERD), the foods you eat and your eating habits are very important. Choosing the right foods can help ease your discomfort. What guidelines do I need to follow?  Choose fruits, vegetables, whole grains, and low-fat dairy products.  Choose low-fat meat, fish, and poultry.  Limit fats such as oils, salad dressings, butter, nuts, and avocado.  Keep a food diary. This helps you identify foods that cause symptoms.  Avoid foods that cause symptoms. These may be different for everyone.  Eat small meals often instead of 3 large meals a day.  Eat your meals slowly, in a place where you are relaxed.  Limit fried foods.  Cook foods using methods other than frying.  Avoid drinking alcohol.  Avoid drinking large amounts of liquids with your meals.  Avoid bending over or lying down until 2-3 hours after eating. What foods are not recommended? These are some foods and drinks that may make your symptoms worse: Vegetables Tomatoes. Tomato juice. Tomato and spaghetti sauce. Chili peppers. Onion and garlic. Horseradish. Fruits Oranges, grapefruit, and lemon (fruit and juice). Meats High-fat  meats, fish, and poultry. This includes hot dogs, ribs, ham, sausage, salami, and bacon. Dairy Whole milk and chocolate milk. Sour cream. Cream. Butter. Ice cream. Cream cheese. Drinks Coffee and tea. Bubbly (carbonated) drinks or energy drinks. Condiments Hot sauce. Barbecue sauce. Sweets/Desserts Chocolate and cocoa. Donuts. Peppermint and spearmint. Fats and Oils High-fat foods. This includes [place] fries and potato chips. Other Vinegar. Strong spices. This includes black pepper, white pepper, red pepper, cayenne, curry powder, cloves, ginger, and chili powder. The items listed above may not be a complete list of foods and drinks to avoid. Contact your dietitian for more information. This information is not intended to replace advice given to you by your health care provider. Make sure you discuss any questions you have with your health care provider. Document Released: 11/18/2011 Document Revised: 10/25/2015 Document Reviewed: 03/23/2013 Elsevier Interactive Patient Education  2017 [employer]

## 2016-12-02 NOTE — Assessment & Plan Note (Signed)
Type 2 diabetes significantly improved with in office A1c of 5.8. No hypoglycemic readings. Continue current dosage of metformin and Bydureon. Encouraged to complete diabetic eye exam independently and monitor blood sugars at home. Follow up in 3 months or sooner if needed.

## 2016-12-18 ENCOUNTER — Other Ambulatory Visit: Payer: Self-pay | Admitting: Family

## 2017-01-15 ENCOUNTER — Other Ambulatory Visit: Payer: Self-pay | Admitting: Family

## 2017-02-06 ENCOUNTER — Ambulatory Visit (INDEPENDENT_AMBULATORY_CARE_PROVIDER_SITE_OTHER): Payer: 59 | Admitting: Family

## 2017-02-06 ENCOUNTER — Ambulatory Visit (INDEPENDENT_AMBULATORY_CARE_PROVIDER_SITE_OTHER)
Admission: RE | Admit: 2017-02-06 | Discharge: 2017-02-06 | Disposition: A | Discharge status: Home / Self Care | Payer: 59 | Source: Ambulatory Visit | Attending: Family | Admitting: Family

## 2017-02-06 ENCOUNTER — Encounter: Payer: Self-pay | Admitting: Family

## 2017-02-06 VITALS — BP 142/88 | HR 98 | Temp 98.2°F | Resp 16 | Ht 67.0 in | Wt 240.0 lb

## 2017-02-06 DIAGNOSIS — K5901 Slow transit constipation: Secondary | ICD-10-CM

## 2017-02-06 DIAGNOSIS — S0083XA Contusion of other part of head, initial encounter: Secondary | ICD-10-CM | POA: Diagnosis not present

## 2017-02-06 DIAGNOSIS — E1122 Type 2 diabetes mellitus with diabetic chronic kidney disease: Secondary | ICD-10-CM | POA: Diagnosis not present

## 2017-02-06 DIAGNOSIS — N181 Chronic kidney disease, stage 1: Secondary | ICD-10-CM | POA: Diagnosis not present

## 2017-02-06 DIAGNOSIS — IMO0002 Reserved for concepts with insufficient information to code with codable children: Secondary | ICD-10-CM

## 2017-02-06 DIAGNOSIS — R197 Diarrhea, unspecified: Secondary | ICD-10-CM | POA: Diagnosis not present

## 2017-02-06 DIAGNOSIS — E1165 Type 2 diabetes mellitus with hyperglycemia: Secondary | ICD-10-CM

## 2017-02-06 LAB — POCT GLYCOSYLATED HEMOGLOBIN (HGB A1C): HEMOGLOBIN A1C: 6.7

## 2017-02-06 NOTE — Progress Notes (Signed)
Subjective:    Patient ID: Brett Walsh, male    DOB: 10/04/1965, 51 y.o.   MRN: [id number]  Chief Complaint  Patient presents with  . Follow-up    diabetes check, knot of face    HPI:  Brett Walsh is a 51 y.o. male who  has a past medical history of Deviated septum; Diabetes mellitus without complication ([place]); and HIV disease ([place]). and presents today for a follow up office visit.  1.) Diabetes - Currently prescribed Bydureon and metformin. Reports taking the medication as prescribed and denies adverse side effects or hypoglycemic readings. Blood sugars at home have been well controlled. Denies new symptoms of end organ damage. No excessive hunger, thirst, or urination. Working on a low/carbohydrate modified oral intake.   Lab Results  Component Value Date   HGBA1C 6.7 02/06/2017     Lab Results  Component Value Date   CREATININE 0.97 11/18/2016   BUN 21 (H) 11/18/2016   NA 136 11/18/2016   K 4.2 11/18/2016   CL 102 11/18/2016   CO2 25 11/18/2016    2. Knot - This is a new problem. Assocaited symptom of a knot located on his cheek has been going on for about 3 weeks after following hitting his cheek on the couch. No brusing and just some inflammation. Has improved in size since initial onset.   3.) Loose stools - This is a new problem. Associated symptoms of looser stools described as ribbon like has been going on for about 1 year. Does have occasionally nausea. Describes that he feels like he needs to go again once he is done. No abdominal pain.    Allergies  Allergen Reactions  . Protonix [Pantoprazole Sodium] Hives      Outpatient Medications Prior to Visit  Medication Sig Dispense Refill  . abacavir-dolutegravir-lamiVUDine (TRIUMEQ) 600-50-300 MG tablet Take 1 tablet by mouth daily. 90 tablet 3  . Blood Glucose Monitoring Suppl (ONE TOUCH ULTRA MINI) w/Device KIT Use meter to check blood sugars 1-4 times daily as instructed. 1 each 0  . Exenatide ER  (BYDUREON) 2 MG PEN Inject 2 mg into the skin once a week. (Patient taking differently: Inject 2 mg into the skin once a week. On Monday) 4 each 2  . glucose blood (ONE TOUCH ULTRA TEST) test strip Use one strip per test. Test blood sugars 1-4 times daily as instructed. 100 each 12  . Lancets Misc. (ONE TOUCH SURESOFT) MISC Use 1 lancet per test. Test blood sugars 1-4 times per day as instructed. 1 each 1  . loratadine (CLARITIN) 10 MG tablet Take 10 mg by mouth daily as needed for allergies.    . metFORMIN (GLUCOPHAGE) 500 MG tablet TAKE 1 TABLET BY MOUTH IN THE MORNING AND 3 TABLETS IN THE EVENING 120 tablet 0  . pantoprazole (PROTONIX) 40 MG tablet Take 1 tablet (40 mg total) by mouth daily. 30 tablet 3   No facility-administered medications prior to visit.       No past surgical history on file.    Past Medical History:  Diagnosis Date  . Deviated septum   . Diabetes mellitus without complication ([place])   . HIV disease ([place])       Review of Systems  Constitutional: Negative for chills and fever.  Eyes:       Negative for changes in vision.  Respiratory: Negative for chest tightness and shortness of breath.   Cardiovascular: Negative for chest pain, palpitations and leg swelling.  Gastrointestinal: Positive  for constipation and diarrhea.  Endocrine: Negative for polydipsia, polyphagia and polyuria.  Neurological: Negative for dizziness, weakness, light-headedness and headaches.      Objective:    BP (!) 142/88 (BP Location: Left Arm, Patient Position: Sitting, Cuff Size: Large)   Pulse 98   Temp 98.2 F (36.8 C) (Oral)   Resp 16   Ht _0  (1.702 m)   Wt 240 lb (108.9 kg)   SpO2 98%   BMI 37.59 kg/m  Nursing note and vital signs reviewed.  Physical Exam  Constitutional: He is oriented to person, place, and time. He appears well-developed and well-nourished. No distress.  HENT:  RIght cheek - No obvious deformity with mild edema and no discoloration small mobile  mass noted inferior to right orbit. Sensation intact and appropriate.   Cardiovascular: Normal rate, regular rhythm, normal heart sounds and intact distal pulses.   Pulmonary/Chest: Effort normal and breath sounds normal.  Abdominal: Soft. Bowel sounds are normal. He exhibits no distension and no mass. There is no hepatosplenomegaly. There is no tenderness. There is no rigidity, no rebound, no guarding, no tenderness at McBurney's point and negative Murphy's sign.  Neurological: He is alert and oriented to person, place, and time.  Skin: Skin is warm and dry.  Psychiatric: He has a normal mood and affect. His behavior is normal. Judgment and thought content normal.       Assessment & Plan:   Problem List Items Addressed This Visit      Digestive   Slow transit constipation - Primary    Symptoms and exam are consistent with constipation. Patient has concern for possible obstruction although not likely given length of symptoms and description. Obtain x-ray. Recommend good bowel regimen and increase fiber intake. Follow up if symptoms worsen or do not improve.       Relevant Orders   DG Abd 2 Views (Completed)     Endocrine   Diabetes type 2, uncontrolled ([place])    In office A1c of 6.7 slightly worsened than previous 5.8. Endorses less than optimal nutritional intake including increased carbohydrates. Encouraged to decrease carbohydrate intake and continue medication as prescribed. Continue current dosage of metformin and Bydureon. Has diabetic eye exam scheduled. All other diabetic screenings are up to date. Follow up A1c in 6 months or sooner if needed.       Relevant Orders   POCT HgB A1C (Completed)     Other   Contusion of face    Symptoms and exam are consistent with mild contusion that appears to be healing without complication. Continue conservative treatment with ice and heat and OTC medications as needed. Follow up for worsening.           I have discontinued Mr. Lowdermilk  pantoprazole. I am also having him maintain his Exenatide ER, ONE TOUCH ULTRA MINI, glucose blood, ONE TOUCH SURESOFT, abacavir-dolutegravir-lamiVUDine, loratadine, and metFORMIN.   Follow-up: Return in about 6 months (around 08/06/2017), or if symptoms worsen or fail to improve.  Mauricio Po, FNP

## 2017-02-06 NOTE — Patient Instructions (Signed)
Thank you for choosing [facility].  SUMMARY AND INSTRUCTIONS:  Pending your x-ray results:  Miralax, Senna, Doculux, or magnesium citrate to help relieve your constipation.   Continue to take your medications as prescribed.  For your heartburn - consider Zantac (150 mg - 300 mg / daily) or Pepcid (20 mg - 40 mg / daily).  Increase your fiber in your diet.  We will check your x-ray today.   Imaging / Radiology:  Please stop by radiology on the basement level of the building for your x-rays. Your results will be released to MyChart (or called to you) after review, usually within 72 hours after test completion. If any treatments or changes are necessary, you will be notified at that same time.  Follow up:  If your symptoms worsen or fail to improve, please contact our office for further instruction, or in case of emergency go directly to the emergency room at the closest medical facility.

## 2017-02-06 NOTE — Assessment & Plan Note (Signed)
Symptoms and exam are consistent with constipation. Patient has concern for possible obstruction although not likely given length of symptoms and description. Obtain x-ray. Recommend good bowel regimen and increase fiber intake. Follow up if symptoms worsen or do not improve.

## 2017-02-06 NOTE — Assessment & Plan Note (Signed)
In office A1c of 6.7 slightly worsened than previous 5.8. Endorses less than optimal nutritional intake including increased carbohydrates. Encouraged to decrease carbohydrate intake and continue medication as prescribed. Continue current dosage of metformin and Bydureon. Has diabetic eye exam scheduled. All other diabetic screenings are up to date. Follow up A1c in 6 months or sooner if needed.

## 2017-02-06 NOTE — Assessment & Plan Note (Signed)
Symptoms and exam are consistent with mild contusion that appears to be healing without complication. Continue conservative treatment with ice and heat and OTC medications as needed. Follow up for worsening.

## 2017-02-14 ENCOUNTER — Other Ambulatory Visit: Payer: Self-pay | Admitting: Family

## 2017-02-17 ENCOUNTER — Other Ambulatory Visit: Payer: 59

## 2017-02-17 DIAGNOSIS — B20 Human immunodeficiency virus [HIV] disease: Secondary | ICD-10-CM

## 2017-02-17 DIAGNOSIS — H2511 Age-related nuclear cataract, right eye: Secondary | ICD-10-CM | POA: Diagnosis not present

## 2017-02-17 DIAGNOSIS — H43823 Vitreomacular adhesion, bilateral: Secondary | ICD-10-CM | POA: Diagnosis not present

## 2017-02-17 DIAGNOSIS — E119 Type 2 diabetes mellitus without complications: Secondary | ICD-10-CM | POA: Diagnosis not present

## 2017-02-17 LAB — CBC WITH DIFFERENTIAL/PLATELET
BASOS ABS: 54 {cells}/uL (ref 0–200)
Basophils Relative: 0.4 %
EOS ABS: 367 {cells}/uL (ref 15–500)
Eosinophils Relative: 2.7 %
HCT: 37.6 % — ABNORMAL LOW (ref 38.5–50.0)
Hemoglobin: 13 g/dL — ABNORMAL LOW (ref 13.2–17.1)
Lymphs Abs: 3196 cells/uL (ref 850–3900)
MCH: 33.3 pg — AB (ref 27.0–33.0)
MCHC: 34.6 g/dL (ref 32.0–36.0)
MCV: 96.4 fL (ref 80.0–100.0)
MONOS PCT: 7.4 %
MPV: 10.9 fL (ref 7.5–12.5)
NEUTROS PCT: 66 %
Neutro Abs: 8976 cells/uL — ABNORMAL HIGH (ref 1500–7800)
PLATELETS: 317 10*3/uL (ref 140–400)
RBC: 3.9 10*6/uL — ABNORMAL LOW (ref 4.20–5.80)
RDW: 13 % (ref 11.0–15.0)
Total Lymphocyte: 23.5 %
WBC mixed population: 1006 cells/uL — ABNORMAL HIGH (ref 200–950)
WBC: 13.6 10*3/uL — ABNORMAL HIGH (ref 3.8–10.8)

## 2017-02-17 LAB — COMPLETE METABOLIC PANEL WITH GFR
AG RATIO: 1.4 (calc) (ref 1.0–2.5)
ALT: 19 U/L (ref 9–46)
AST: 13 U/L (ref 10–40)
Albumin: 4.2 g/dL (ref 3.6–5.1)
Alkaline phosphatase (APISO): 73 U/L (ref 40–115)
BILIRUBIN TOTAL: 0.3 mg/dL (ref 0.2–1.2)
BUN: 17 mg/dL (ref 7–25)
CHLORIDE: 98 mmol/L (ref 98–110)
CO2: 22 mmol/L (ref 20–32)
Calcium: 9.1 mg/dL (ref 8.6–10.3)
Creat: 0.93 mg/dL (ref 0.60–1.35)
GFR, Est African American: 111 mL/min/{1.73_m2} (ref >=60)
GFR, Est Non African American: 96 mL/min/{1.73_m2} (ref >=60)
GLUCOSE: 332 mg/dL — AB (ref 65–99)
Globulin: 3.1 g/dL (calc) (ref 1.9–3.7)
POTASSIUM: 4.6 mmol/L (ref 3.5–5.3)
Sodium: 133 mmol/L — ABNORMAL LOW (ref 135–146)
Total Protein: 7.3 g/dL (ref 6.1–8.1)

## 2017-02-17 LAB — HM DIABETES EYE EXAM

## 2017-02-18 ENCOUNTER — Encounter: Payer: Self-pay | Admitting: Family

## 2017-02-18 LAB — T-HELPER CELL (CD4) - (RCID CLINIC ONLY)
CD4 % Helper T Cell: 11 % — ABNORMAL LOW (ref 33–55)
CD4 T Cell Abs: 380 /uL — ABNORMAL LOW (ref 400–2700)

## 2017-02-19 LAB — HIV-1 RNA QUANT-NO REFLEX-BLD
HIV 1 RNA QUANT: DETECTED {copies}/mL — AB
HIV-1 RNA QUANT, LOG: DETECTED {Log_copies}/mL — AB

## 2017-03-03 ENCOUNTER — Ambulatory Visit: Payer: 59 | Admitting: Infectious Diseases

## 2017-03-14 ENCOUNTER — Other Ambulatory Visit: Payer: Self-pay | Admitting: Family

## 2017-03-14 DIAGNOSIS — H04123 Dry eye syndrome of bilateral lacrimal glands: Secondary | ICD-10-CM | POA: Diagnosis not present

## 2017-03-18 ENCOUNTER — Encounter: Payer: Self-pay | Admitting: Family

## 2017-03-20 ENCOUNTER — Ambulatory Visit (INDEPENDENT_AMBULATORY_CARE_PROVIDER_SITE_OTHER): Payer: 59 | Admitting: Internal Medicine

## 2017-03-20 ENCOUNTER — Encounter: Payer: Self-pay | Admitting: Internal Medicine

## 2017-03-20 ENCOUNTER — Encounter: Payer: Self-pay | Admitting: Infectious Diseases

## 2017-03-20 DIAGNOSIS — B370 Candidal stomatitis: Secondary | ICD-10-CM | POA: Diagnosis not present

## 2017-03-20 MED ORDER — FLUCONAZOLE 100 MG PO TABS: 100.0000 mg | ORAL_TABLET | Freq: Every day | ORAL | 0 refills | Status: DC

## 2017-03-20 NOTE — Progress Notes (Signed)
   Subjective:    Patient ID: Brett Walsh, male    DOB: 10/12/1965, 51 y.o.   MRN: [id number]  HPI The patient is a 51 YO man coming in for thrush going on for 2 months. White crusting in the morning which he can brush off tongue. Does have HIV but last viral load undetected and CD4 count normal. Denies fevers or chills. No new lumps or bumps. Denies weight change.   Review of Systems  Constitutional: Negative.   HENT: Negative for congestion, dental problem, drooling, sore throat, trouble swallowing and voice change.        White tongue crusting  Respiratory: Negative.   Cardiovascular: Negative.   Gastrointestinal: Negative.   Musculoskeletal: Negative.   Skin: Positive for color change.  Neurological: Negative.       Objective:   Physical Exam  Constitutional: He is oriented to person, place, and time. He appears well-developed and well-nourished.  HENT:  Head: Normocephalic and atraumatic.  White plaque which is removable on the tongue.   Eyes: EOM are normal.  Neck: Normal range of motion.  Cardiovascular: Normal rate and regular rhythm.   Pulmonary/Chest: Effort normal and breath sounds normal. No respiratory distress. He has no wheezes. He has no rales.  Abdominal: Soft. He exhibits no distension. There is no tenderness. There is no rebound.  Musculoskeletal: He exhibits no edema.  Neurological: He is alert and oriented to person, place, and time. Coordination normal.  Skin: Skin is warm and dry.   Vitals:   03/20/17 1558  BP: 128/70  Pulse: 98  Temp: 98 F (36.7 C)  TempSrc: Oral  SpO2: 99%  Weight: 231 lb (104.8 kg)  Height: 5\' 7"  (1.702 m)      Assessment & Plan:

## 2017-03-20 NOTE — Patient Instructions (Signed)
We have sent in the diflucan to take 1 pill daily for 10 days.

## 2017-03-20 NOTE — Assessment & Plan Note (Signed)
Diflucan 100 mg daily for 10 days. Talked about good dental hygiene.

## 2017-04-06 ENCOUNTER — Ambulatory Visit: Payer: 59 | Admitting: Infectious Diseases

## 2017-04-07 ENCOUNTER — Ambulatory Visit: Payer: 59 | Admitting: Infectious Diseases

## 2017-04-16 ENCOUNTER — Ambulatory Visit (INDEPENDENT_AMBULATORY_CARE_PROVIDER_SITE_OTHER): Payer: 59 | Admitting: Infectious Diseases

## 2017-04-16 ENCOUNTER — Encounter: Payer: Self-pay | Admitting: Infectious Diseases

## 2017-04-16 VITALS — BP 165/107 | HR 118 | Temp 98.7°F | Wt 226.0 lb

## 2017-04-16 DIAGNOSIS — E11649 Type 2 diabetes mellitus with hypoglycemia without coma: Secondary | ICD-10-CM

## 2017-04-16 DIAGNOSIS — B370 Candidal stomatitis: Secondary | ICD-10-CM | POA: Diagnosis not present

## 2017-04-16 DIAGNOSIS — Z23 Encounter for immunization: Secondary | ICD-10-CM | POA: Diagnosis not present

## 2017-04-16 DIAGNOSIS — B20 Human immunodeficiency virus [HIV] disease: Secondary | ICD-10-CM | POA: Diagnosis not present

## 2017-04-16 DIAGNOSIS — Z79899 Other long term (current) drug therapy: Secondary | ICD-10-CM | POA: Diagnosis not present

## 2017-04-16 DIAGNOSIS — Z113 Encounter for screening for infections with a predominantly sexual mode of transmission: Secondary | ICD-10-CM

## 2017-04-16 NOTE — Addendum Note (Signed)
Addended by: Lurlean LeydenPOOLE, TRAVIS F on: 04/16/2017 04:50 PM   Modules accepted: Orders

## 2017-04-16 NOTE — Assessment & Plan Note (Signed)
He is doing well Gets flu shot today Mening vax Here with fiance- will get him in for PREP.  Offered/refused condoms.  Will see him back in 9 months.

## 2017-04-16 NOTE — Assessment & Plan Note (Addendum)
Has better control now  On exenetide and metformin.  Denies numbness or pedal wounds.  F/u pcp

## 2017-04-16 NOTE — Assessment & Plan Note (Signed)
Not currently present I have asked him to come in and show me next time he has this.  Feels like he has a "coat" on the back of his throat

## 2017-04-16 NOTE — Progress Notes (Signed)
   Subjective:    Patient ID: Brett MarionEverardo Walsh, male    DOB: 1965/08/14, 51 y.o.   MRN: [id number]  HPI  51yo M with hx of HIV+ since 2008 (when he was hospitalized with pneumonia), on DTGV/TRV (was on atripla prior, developed resistance), also hx of DM2 (2012) on metformin.  He was seen in f/u in ID clinic on 06-20-14 and started on triumeq.  HIV 1 RNA Quant (copies/mL)  Date Value  02/17/2017 <20 DETECTED (A)  07/09/2016 <20 NOT DETECTED  01/07/2016 48 (H)   CD4 T Cell Abs (/uL)  Date Value  02/17/2017 380 (L)  07/09/2016 270 (L)  01/07/2016 290 (L)   Has had persistent thrush. Was given rx for fluconazole for 10 days without relief.   FSG have been "pretty good". Last A1C was ~6.   Review of Systems  Constitutional: Negative for appetite change, chills, fever and unexpected weight change.  HENT: Positive for mouth sores. Negative for trouble swallowing.   Gastrointestinal: Negative for constipation and diarrhea.  Genitourinary: Negative for difficulty urinating.  Psychiatric/Behavioral: Negative for sleep disturbance.  Please see HPI. All other systems reviewed and negative.     Objective:   Physical Exam  Constitutional: He appears well-developed and well-nourished.  HENT:  Mouth/Throat: No oropharyngeal exudate.  Eyes: EOM are normal. Pupils are equal, round, and reactive to light.  Neck: Neck supple.  Cardiovascular: Normal rate, regular rhythm and normal heart sounds.  Pulmonary/Chest: Effort normal and breath sounds normal.  Abdominal: Soft. Bowel sounds are normal. There is no tenderness. There is no rebound.  Musculoskeletal: He exhibits no edema.  Lymphadenopathy:    He has no cervical adenopathy.  Psychiatric: He has a normal mood and affect.      Assessment & Plan:

## 2017-04-17 ENCOUNTER — Other Ambulatory Visit: Payer: Self-pay

## 2017-04-17 MED ORDER — EXENATIDE ER 2 MG ~~LOC~~ PEN: 2.0000 mg | PEN_INJECTOR | SUBCUTANEOUS | 0 refills | Status: DC

## 2017-05-08 ENCOUNTER — Encounter: Payer: Self-pay | Admitting: Nurse Practitioner

## 2017-05-08 ENCOUNTER — Ambulatory Visit: Payer: 59 | Admitting: Nurse Practitioner

## 2017-05-08 VITALS — BP 140/78 | HR 108 | Temp 98.4°F | Resp 16 | Ht 67.0 in | Wt 222.0 lb

## 2017-05-08 DIAGNOSIS — R630 Anorexia: Secondary | ICD-10-CM

## 2017-05-08 DIAGNOSIS — R634 Abnormal weight loss: Secondary | ICD-10-CM

## 2017-05-08 MED ORDER — EXENATIDE ER 2 MG ~~LOC~~ PEN: 2.0000 mg | PEN_INJECTOR | SUBCUTANEOUS | 0 refills | Status: DC

## 2017-05-08 NOTE — Patient Instructions (Addendum)
Let try stopping your metformin for about 2 weeks to see if your symptoms improve. Keep an eye on your blood sugars, we need to keep readings below 180. Let me know if you get consistent readings above this number.   Call or let me know in about 2 weeks how your symptoms are doing, we can try an acid medication at that time if you've not had any improvement.  It might be helpful for you to mention your symptoms at your next visit with Dr. Ninetta LightsHatcher as well, so that we can get his input.  Id like to see you back in about 1 month to see how you are doing.  It was nice to meet you. Thanks for letting me take care of you today :)

## 2017-05-08 NOTE — Progress Notes (Signed)
Subjective:    Patient ID: Brett Walsh, male    DOB: 1965/09/26, 51 y.o.   MRN: [id number]  HPI Brett Walsh is a 51 yo male who presents today to establish care. He is transferring to me from another provider in the same clinic.   Weight loss- This is a new problem. This problem began about 1 month ago. he's lost around 10 lbs. He states he just doesn't feel like eating and when he does eat he is not abl to eat normal amounts of food. He reports halitosis, "filmy feeling" in his mouth, decreased appetite, early satiety, bloating, acid regurgitation, nausea, constipation, diarrhea. He denies fevers, rash, abdominal pain, hematemesis, vomiting, dysuria, hematuria, blood in vomit, melena.  He has had oral thrush in the recent past. He was treated with diflucan 161m daily for 10 days in October for thrush and says that his symptoms only improved somewhat. He saw Dr HJohnnye Sima his infectious disease doctor, in November for maintenance of HIV therapy and no thrush was noted on physical examination. He had an abdominal x-ray on 9/7 for constipation, which was normal, which showed normal results. He is wondering if some of his symptoms could be related to metformin, which he started taking 2 years ago.  Wt Readings from Last 3 Encounters:  05/08/17 222 lb (100.7 kg)  04/16/17 226 lb (102.5 kg)  03/20/17 231 lb (104.8 kg)    Review of Systems  See HPI  Past Medical History:  Diagnosis Date  . Deviated septum   . Diabetes mellitus without complication ([place]   . HIV disease ([place]      Social History   Socioeconomic History  . Marital status: Single    Spouse name: Not on file  . Number of children: 0  . Years of education: 142 . Highest education level: Not on file  Social Needs  . Financial resource strain: Not on file  . Food insecurity - worry: Not on file  . Food insecurity - inability: Not on file  . Transportation needs - medical: Not on file  . Transportation needs -  non-medical: Not on file  Occupational History  . 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 Smoking status: Light Tobacco Smoker    Packs/day: 0.50    Years: 36.00    Pack years: 18.00    Types: Cigarettes, E-cigarettes  . Smokeless tobacco: Current User  Substance and Sexual Activity  . Alcohol use: Yes    Alcohol/week: 0.6 oz    Types: 1 Standard drinks or equivalent per week    Comment: socially  . Drug use: No  . Sexual activity: No    Partners: Male    Comment: pt. declined condoms  Other Topics Concern  . Not on file  Social History Narrative   Fun: Watch TV, veg at home.     No past surgical history on file.  Family History  Problem Relation Age of Onset  . Diabetes Brother   . Diabetes Mother   . Diabetes Father   . Heart failure Father     Allergies  Allergen Reactions  . Protonix [Pantoprazole Sodium] Hives    Current Outpatient Medications on File Prior to Visit  Medication Sig Dispense Refill  . abacavir-dolutegravir-lamiVUDine (TRIUMEQ) 600-50-300 MG tablet Take 1 tablet by mouth daily. 90 tablet 3  . Blood Glucose Monitoring Suppl (ONE TOUCH ULTRA MINI) w/Device KIT Use meter to check blood sugars 1-4 times daily as instructed. 1 each 0  .  Exenatide ER (BYDUREON) 2 MG PEN Inject 2 mg once a week into the skin. 4 each 0  . glucose blood (ONE TOUCH ULTRA TEST) test strip Use one strip per test. Test blood sugars 1-4 times daily as instructed. 100 each 12  . Lancets Misc. (ONE TOUCH SURESOFT) MISC Use 1 lancet per test. Test blood sugars 1-4 times per day as instructed. 1 each 1  . loratadine (CLARITIN) 10 MG tablet Take 10 mg by mouth daily as needed for allergies.    . metFORMIN (GLUCOPHAGE) 500 MG tablet Take 1 tablet by mouth in the morning and 3 tablets in the evening. Please establish with new PCP for more refills. 120 tablet 1   No current facility-administered medications on file prior to visit.     BP 140/78 (BP Location: Left  Arm, Patient Position: Sitting, Cuff Size: Large)   Pulse (!) 108   Temp 98.4 F (36.9 C) (Oral)   Resp 16   Ht 5' 7"  (1.702 m)   Wt 222 lb (100.7 kg)   SpO2 97%   BMI 34.77 kg/m       Objective:   Physical Exam  Constitutional: He is oriented to person, place, and time. He appears well-developed and well-nourished. No distress.  HENT:  Head: Normocephalic and atraumatic.  Mouth/Throat: Oropharynx is clear and moist and mucous membranes are normal.  Cardiovascular: Normal rate, regular rhythm, normal heart sounds and intact distal pulses.  Pulmonary/Chest: Effort normal and breath sounds normal.  Abdominal: Soft. Bowel sounds are normal. He exhibits no distension. There is no hepatosplenomegaly. There is no tenderness. There is no rebound.  Neurological: He is alert and oriented to person, place, and time. Coordination normal.  Skin: Skin is warm and dry.  Psychiatric: He has a normal mood and affect. Judgment and thought content normal.      Assessment & Plan:  Weight loss, appetite loss: Suspect his GI symptoms may be related to medications- metformin, triumeq. His last A1C was under goal, and he reports good readings at home except for the days he drinks coca cola. We will trial 2 weeks off metformin to see if symptoms improve. [place] notify office for blood sugars over 180 at home. He will call clinic to let me know how he is doing in about 2 weeks and well consider acid reducer at that time if still symptomatic. RTC in 1 month for f/u of weight loss  Lab Results  Component Value Date   HGBA1C 6.7 02/06/2017

## 2017-05-14 ENCOUNTER — Other Ambulatory Visit: Payer: Self-pay

## 2017-05-14 MED ORDER — METFORMIN HCL 500 MG PO TABS: ORAL_TABLET | ORAL | 1 refills | Status: DC

## 2017-06-12 ENCOUNTER — Telehealth: Payer: Self-pay | Admitting: Family Medicine

## 2017-06-12 ENCOUNTER — Ambulatory Visit: Payer: 59 | Admitting: Nurse Practitioner

## 2017-06-12 ENCOUNTER — Encounter: Payer: Self-pay | Admitting: Nurse Practitioner

## 2017-06-12 ENCOUNTER — Ambulatory Visit (INDEPENDENT_AMBULATORY_CARE_PROVIDER_SITE_OTHER)
Admission: RE | Admit: 2017-06-12 | Discharge: 2017-06-12 | Disposition: A | Discharge status: Home / Self Care | Payer: 59 | Source: Ambulatory Visit | Attending: Nurse Practitioner | Admitting: Nurse Practitioner

## 2017-06-12 ENCOUNTER — Other Ambulatory Visit (INDEPENDENT_AMBULATORY_CARE_PROVIDER_SITE_OTHER): Payer: 59

## 2017-06-12 VITALS — BP 134/80 | HR 106 | Temp 98.2°F | Resp 16 | Ht 67.0 in | Wt 212.0 lb

## 2017-06-12 DIAGNOSIS — R634 Abnormal weight loss: Secondary | ICD-10-CM | POA: Diagnosis not present

## 2017-06-12 DIAGNOSIS — E11649 Type 2 diabetes mellitus with hypoglycemia without coma: Secondary | ICD-10-CM

## 2017-06-12 LAB — CBC WITH DIFFERENTIAL/PLATELET
Basophils Absolute: 0.1 10*3/uL (ref 0.0–0.1)
Basophils Relative: 0.5 % (ref 0.0–3.0)
EOS PCT: 1.7 % (ref 0.0–5.0)
Eosinophils Absolute: 0.2 10*3/uL (ref 0.0–0.7)
HCT: 40.3 % (ref 39.0–52.0)
HEMOGLOBIN: 13.6 g/dL (ref 13.0–17.0)
LYMPHS ABS: 2.8 10*3/uL (ref 0.7–4.0)
Lymphocytes Relative: 25.2 % (ref 12.0–46.0)
MCHC: 33.7 g/dL (ref 30.0–36.0)
MCV: 97.2 fl (ref 78.0–100.0)
MONOS PCT: 8.9 % (ref 3.0–12.0)
Monocytes Absolute: 1 10*3/uL (ref 0.1–1.0)
Neutro Abs: 7.1 10*3/uL (ref 1.4–7.7)
Neutrophils Relative %: 63.7 % (ref 43.0–77.0)
Platelets: 304 10*3/uL (ref 150.0–400.0)
RBC: 4.14 Mil/uL — AB (ref 4.22–5.81)
RDW: 12.4 % (ref 11.5–15.5)
WBC: 11.2 10*3/uL — AB (ref 4.0–10.5)

## 2017-06-12 LAB — COMPREHENSIVE METABOLIC PANEL
ALT: 9 U/L (ref 0–53)
AST: 8 U/L (ref 0–37)
Albumin: 4.2 g/dL (ref 3.5–5.2)
Alkaline Phosphatase: 72 U/L (ref 39–117)
BUN: 15 mg/dL (ref 6–23)
CHLORIDE: 96 meq/L (ref 96–112)
CO2: 26 mEq/L (ref 19–32)
Calcium: 9.4 mg/dL (ref 8.4–10.5)
Creatinine, Ser: 1.02 mg/dL (ref 0.40–1.50)
GFR: 82.34 mL/min (ref >60.00)
GLUCOSE: 620 mg/dL — AB (ref 70–99)
POTASSIUM: 4.5 meq/L (ref 3.5–5.1)
Sodium: 130 mEq/L — ABNORMAL LOW (ref 135–145)
TOTAL PROTEIN: 8 g/dL (ref 6.0–8.3)
Total Bilirubin: 0.4 mg/dL (ref 0.2–1.2)

## 2017-06-12 LAB — TSH: TSH: 2.25 u[IU]/mL (ref 0.35–4.50)

## 2017-06-12 LAB — HEMOGLOBIN A1C: HEMOGLOBIN A1C: 13.8 % — AB (ref 4.6–6.5)

## 2017-06-12 MED ORDER — INSULIN DETEMIR 100 UNIT/ML FLEXPEN: 5.0000 [IU] | PEN_INJECTOR | Freq: Every day | SUBCUTANEOUS | 1 refills | Status: DC

## 2017-06-12 MED ORDER — PEN NEEDLES 31G X 8 MM MISC: 5 refills | Status: DC

## 2017-06-12 NOTE — Progress Notes (Signed)
Subjective:    Patient ID: Brett Walsh, male    DOB: 07-18-1965, 52 y.o.   MRN: [id number]  HPI  [name] presents today for a follow up visit of weight loss. During his last visit on 12/7 he complained of about a 10 lb weight loss over a short period of time. He complained of decreased appetite, bloating, and GI upset. He was requesting to stop metformin, which he felt was causing his weight loss and gastrointestinal complaints. He had normal A1c and good blood sugar readings at home, so we decided to trial a period off of the metormin to see if his appetite improved and weight stabilized. We did discuss symptoms of acid reflux, but he did not want to start acid reflux treatment at this visit. He was really wanting to stop the metformin first and see if this helped. He was instructed to call for continued regurgitation and burning with plan to start acid reducers if needed.  Over the past month, he reports his GI upset has greatly improved. He has had resolution of most of his complaints last month-early satiety, bloating, acid regurgitation, sour taste in his mouth, nausea, constipation, diarrhea. His bowel movements have returned to normal- about twice a day. Aside from stopping metformin, he has tried to follow the acid reflux lifestyle modifications we discussed at his last appointment- not eating before lying flat, avoiding irritating foods. He has continued his bydureon 2 mg once weekly. He checks his blood sugars almost daily-readings have ben elevated around 180-200 on most days. He denies any low readings. Although his appetite has improved, he says he still has not felt like eating whole meals but "grazes" on small bites and snacks all day- granola bars, crackers. He has continued to lose weight, lost an additional 10 lbs over the past 1 month.   He says that he overall feels well today. He denies fevers, weakness, dizziness, lymphadenopthay, cough, abdominal pain, vomting, dysuria, urinary  frequency   Wt Readings from Last 3 Encounters:  06/12/17 212 lb (96.2 kg)  05/08/17 222 lb (100.7 kg)  04/16/17 226 lb (102.5 kg)   Review of Systems  See HPI  Past Medical History:  Diagnosis Date  . Deviated septum   . Diabetes mellitus without complication ([place])   . HIV disease ([place])      Social History   Socioeconomic History  . Marital status: Single    Spouse name: Not on file  . Number of children: 0  . Years of education: 46  . Highest education level: Not on file  Social Needs  . Financial resource strain: Not on file  . Food insecurity - worry: Not on file  . Food insecurity - inability: Not on file  . Transportation needs - medical: Not on file  . Transportation needs - non-medical: Not on file  Occupational History  . Occupation: Financial risk analyst  Tobacco Use  . Smoking status: Light Tobacco Smoker    Packs/day: 0.50    Years: 36.00    Pack years: 18.00    Types: Cigarettes, E-cigarettes  . Smokeless tobacco: Current User  Substance and Sexual Activity  . Alcohol use: Yes    Alcohol/week: 0.6 oz    Types: 1 Standard drinks or equivalent per week    Comment: socially  . Drug use: No  . Sexual activity: No    Partners: Male    Comment: pt. declined condoms  Other Topics Concern  . Not on file  Social  History Narrative   Fun: Watch TV, veg at home.     No past surgical history on file.  Family History  Problem Relation Age of Onset  . Diabetes Brother   . Diabetes Mother   . Diabetes Father   . Heart failure Father     Allergies  Allergen Reactions  . Protonix [Pantoprazole Sodium] Hives    Current Outpatient Medications on File Prior to Visit  Medication Sig Dispense Refill  . abacavir-dolutegravir-lamiVUDine (TRIUMEQ) 600-50-300 MG tablet Take 1 tablet by mouth daily. 90 tablet 3  . Blood Glucose Monitoring Suppl (ONE TOUCH ULTRA MINI) w/Device KIT Use meter to check blood sugars 1-4 times daily as instructed. 1 each 0    . Exenatide ER (BYDUREON) 2 MG PEN Inject 2 mg into the skin once a week. 4 each 0  . glucose blood (ONE TOUCH ULTRA TEST) test strip Use one strip per test. Test blood sugars 1-4 times daily as instructed. 100 each 12  . Lancets Misc. (ONE TOUCH SURESOFT) MISC Use 1 lancet per test. Test blood sugars 1-4 times per day as instructed. 1 each 1  . loratadine (CLARITIN) 10 MG tablet Take 10 mg by mouth daily as needed for allergies.     No current facility-administered medications on file prior to visit.     BP 134/80 (BP Location: Left Arm, Patient Position: Sitting, Cuff Size: Large)   Pulse (!) 106   Temp 98.2 F (36.8 C) (Oral)   Resp 16   Ht 5' 7"  (1.702 m)   Wt 212 lb (96.2 kg)   SpO2 98%   BMI 33.20 kg/m        Objective:   Physical Exam  Constitutional: He is oriented to person, place, and time. He appears well-developed and well-nourished. No distress.  HENT:  Head: Normocephalic and atraumatic.  Neck: Neck supple. No thyromegaly present.  Cardiovascular: Regular rhythm, normal heart sounds and intact distal pulses.  Pulmonary/Chest: Effort normal and breath sounds normal.  Abdominal: Soft. Bowel sounds are normal. He exhibits no distension. There is no hepatosplenomegaly. There is no tenderness. There is no rigidity, no guarding and no CVA tenderness.  Lymphadenopathy:    He has no cervical adenopathy.  Neurological: He is alert and oriented to person, place, and time. Coordination normal.  Skin: Skin is warm and dry.  Psychiatric: He has a normal mood and affect. Judgment and thought content normal.      Assessment & Plan:  RTC in 1 month for follow up of diabetes and weight loss  Weight loss He continues to have decreased appetite with documented weight loss. Although this could be related to elevated blood sugars or side effect of medication, will collect lab work and x-ray to look for further causes today. Diagnostic testing ordered: - TSH; Future - CBC with  Differential/Platelet; Future - Comprehensive metabolic panel; Future - DG Chest 2 View; Future He will call Dr Shana Chute office for a follow up appointment.

## 2017-06-12 NOTE — Patient Instructions (Addendum)
Please head downstairs for lab work.  Lets try to start you on a daily 24 hour insulin called levemir. You will begin with 5 units tonight and tomorrow night. After tomorrow night, you will begin checking a fasting blood sugar every morning.  You will add 2 units to your nightly levemir dose each night until your fasting blood sugars are reading around 120. Please reach out to me and let me know when you get to fasting blood sugars of 120. We will record the dose in your chart, and keep you at that dose. Each levemir pen can be kept out of the refrigerator for 30 days, but you must store the unused ones in the refrigerator. Id like to see you back in about 1 month, to see how you are doing on the insulin.  Please call Dr Ninetta LightsHatcher and let them know about your weight loss and that you need an appointment sooner.  It was good to see you! Thanks for letting me take care of you today :)

## 2017-06-12 NOTE — Telephone Encounter (Signed)
Received call from RN line regarding critical glucose level of 620 on CMET from today. Three attempts to call patient were made, but unsuccessful. Rn was advised to leave a voicemail directing patient to go to the ED for further evaluation and rule out DKA/HHS.  Katina Degreealeb M. Jimmey RalphParker, MD 06/12/2017 6:32 PM

## 2017-06-13 ENCOUNTER — Encounter: Payer: Self-pay | Admitting: Nurse Practitioner

## 2017-06-13 NOTE — Assessment & Plan Note (Addendum)
Elevated blood sugar readings at home without metformin. His GI symptoms have improved off metformin. We discussed initiating a once daily long acting insulin to control blood sugar and he is agreeable. Levemir prescription sent with education on use done, he will call with any questions related to use, instructions printed. Medications ordered: - Insulin Detemir (LEVEMIR FLEXTOUCH) 100 UNIT/ML Pen; Inject 5 Units into the skin daily at 10 pm. Taper up as directed  Dispense: 15 mL; Refill: 1 Diagnostic testing ordered: - Hemoglobin A1c; Future RTC in 1 month for follow up.

## 2017-06-14 NOTE — Telephone Encounter (Signed)
I also made personal attempt to contact the patient on Saturday 1/12 and left a detailed voicemail.

## 2017-06-15 ENCOUNTER — Other Ambulatory Visit: Payer: Self-pay | Admitting: Nurse Practitioner

## 2017-06-15 ENCOUNTER — Telehealth: Payer: Self-pay | Admitting: Nurse Practitioner

## 2017-06-15 DIAGNOSIS — E11649 Type 2 diabetes mellitus with hypoglycemia without coma: Secondary | ICD-10-CM

## 2017-06-15 NOTE — Telephone Encounter (Signed)
Please have pt stop by lab today to have his glucose rechecked-bmet ordered If he wants to, it may be a good idea to bring his glucometer and let our nurse check for accuracy. I would also like for him to increase his levemir to 10 units daily at bedtime, then titrate up 2 units every 2 days until his fasting am blood sugars are around 110-120. I would also like for him to take an additional 2 units every morning for just the next three mornings, so we can get his blood sugar down to a stable number.

## 2017-06-15 NOTE — Telephone Encounter (Signed)
Pt given message about need to go to ER. Pt states he took his blood sugar this am @0500  and it was 345. [facility] Call ended.

## 2017-06-15 NOTE — Telephone Encounter (Signed)
Pt is aware of response below. Pt states he is unable to come in for blood work until tomorrow.

## 2017-06-15 NOTE — Progress Notes (Signed)
Please have pt stop by lab today to have his glucose rechecked-bmet ordered If he wants to, it may be a good idea to bring his glucometer and let our nurse check for accuracy. I would also like for him to increase his levemir to 10 units daily at bedtime, then titrate up 2 units every 2 days until his fasting am blood sugars are around 110-120. I would also like for him to take an additional 2 units every morning for just the next three mornings, so we can get his blood sugar down to a stable number. 

## 2017-06-16 ENCOUNTER — Other Ambulatory Visit (INDEPENDENT_AMBULATORY_CARE_PROVIDER_SITE_OTHER): Payer: 59

## 2017-06-16 DIAGNOSIS — E11649 Type 2 diabetes mellitus with hypoglycemia without coma: Secondary | ICD-10-CM | POA: Diagnosis not present

## 2017-06-16 LAB — BASIC METABOLIC PANEL
BUN: 19 mg/dL (ref 6–23)
CHLORIDE: 99 meq/L (ref 96–112)
CO2: 24 mEq/L (ref 19–32)
Calcium: 8.9 mg/dL (ref 8.4–10.5)
Creatinine, Ser: 0.89 mg/dL (ref 0.40–1.50)
GFR: 96.37 mL/min (ref >60.00)
Glucose, Bld: 349 mg/dL — ABNORMAL HIGH (ref 70–99)
POTASSIUM: 3.9 meq/L (ref 3.5–5.1)
SODIUM: 135 meq/L (ref 135–145)

## 2017-06-17 ENCOUNTER — Other Ambulatory Visit: Payer: Self-pay

## 2017-06-17 MED ORDER — EXENATIDE ER 2 MG ~~LOC~~ PEN: 2.0000 mg | PEN_INJECTOR | SUBCUTANEOUS | 0 refills | Status: DC

## 2017-07-13 ENCOUNTER — Ambulatory Visit: Payer: 59 | Admitting: Nurse Practitioner

## 2017-07-21 ENCOUNTER — Emergency Department (HOSPITAL_COMMUNITY)
Admission: EM | Admit: 2017-07-21 | Discharge: 2017-07-21 | Disposition: A | Discharge status: Home / Self Care | Payer: 59 | Attending: Emergency Medicine | Admitting: Emergency Medicine

## 2017-07-21 ENCOUNTER — Encounter (HOSPITAL_COMMUNITY): Payer: Self-pay | Admitting: Emergency Medicine

## 2017-07-21 DIAGNOSIS — Z794 Long term (current) use of insulin: Secondary | ICD-10-CM | POA: Insufficient documentation

## 2017-07-21 DIAGNOSIS — J111 Influenza due to unidentified influenza virus with other respiratory manifestations: Secondary | ICD-10-CM | POA: Diagnosis not present

## 2017-07-21 DIAGNOSIS — F17210 Nicotine dependence, cigarettes, uncomplicated: Secondary | ICD-10-CM | POA: Insufficient documentation

## 2017-07-21 DIAGNOSIS — Z79899 Other long term (current) drug therapy: Secondary | ICD-10-CM | POA: Diagnosis not present

## 2017-07-21 DIAGNOSIS — R05 Cough: Secondary | ICD-10-CM | POA: Diagnosis present

## 2017-07-21 DIAGNOSIS — B20 Human immunodeficiency virus [HIV] disease: Secondary | ICD-10-CM | POA: Diagnosis not present

## 2017-07-21 DIAGNOSIS — E119 Type 2 diabetes mellitus without complications: Secondary | ICD-10-CM | POA: Insufficient documentation

## 2017-07-21 MED ORDER — OSELTAMIVIR PHOSPHATE 75 MG PO CAPS: 75.0000 mg | ORAL_CAPSULE | Freq: Two times a day (BID) | ORAL | 0 refills | Status: DC

## 2017-07-21 NOTE — ED Provider Notes (Signed)
[facility] DEPT Provider Note   CSN: [id number] Arrival date & time: 07/21/17  1140     History   Chief Complaint Chief Complaint  Patient presents with  . flu like symptoms    HPI Brett Walsh is a 52 y.o. male.  This is a 52 year old male who presents with 1 day of flulike illness consisting of cough and congestion with posttussive emesis.  No severe diarrhea.  No fever or chills.  Over-the-counter medications without relief.  Denies any trouble swallowing but does have a scratchy throat.  No ear pain.  Denies any urinary symptoms.      Past Medical History:  Diagnosis Date  . Deviated septum   . Diabetes mellitus without complication ([place])   . HIV disease [facility])     Patient Active Problem List   Diagnosis Date Noted  . Contusion of face 02/06/2017  . GERD (gastroesophageal reflux disease) 12/02/2016  . Medication reaction 10/14/2016  . Depression (emotion) 07/23/2016  . Insomnia 07/23/2016  . Routine general medical examination at a health care facility 06/20/2016  . TMJ (temporomandibular joint syndrome) 01/21/2016  . Slow transit constipation 08/16/2014  . Thrush 07/10/2014  . HIV disease ([place]) 06/20/2014  . Diabetes type 2, uncontrolled ([place]) 06/20/2014    History reviewed. No pertinent surgical history.     Home Medications    Prior to Admission medications   Medication Sig Start Date End Date Taking? Authorizing Provider  abacavir-dolutegravir-lamiVUDine (TRIUMEQ) 600-50-300 MG tablet Take 1 tablet by mouth daily. 10/15/16   Campbell Riches, MD  Blood Glucose Monitoring Suppl (ONE TOUCH ULTRA MINI) w/Device KIT Use meter to check blood sugars 1-4 times daily as instructed. 10/14/16   Golden Circle, FNP  Exenatide ER (BYDUREON) 2 MG PEN Inject 2 mg into the skin once a week. 06/17/17   Lance Sell, NP  glucose blood (ONE TOUCH ULTRA TEST) test strip Use one strip per test. Test blood sugars 1-4 times daily  as instructed. 10/14/16   Golden Circle, FNP  Insulin Detemir (LEVEMIR FLEXTOUCH) 100 UNIT/ML Pen Inject 5 Units into the skin daily at 10 pm. Taper up as directed 06/12/17   Lance Sell, NP  Insulin Pen Needle (PEN NEEDLES) 31G X 8 MM MISC Use to inject insulin as needed. 06/12/17   Lance Sell, NP  Lancets Misc. (ONE TOUCH SURESOFT) MISC Use 1 lancet per test. Test blood sugars 1-4 times per day as instructed. 10/14/16   Golden Circle, FNP  loratadine (CLARITIN) 10 MG tablet Take 10 mg by mouth daily as needed for allergies.    [provider]    Family History Family History  Problem Relation Age of Onset  . Diabetes Brother   . Diabetes Mother   . Diabetes Father   . Heart failure Father     Social History Social History   Tobacco Use  . Smoking status: Light Tobacco Smoker    Packs/day: 0.50    Years: 36.00    Pack years: 18.00    Types: Cigarettes, E-cigarettes  . Smokeless tobacco: Current User  Substance Use Topics  . Alcohol use: Yes    Alcohol/week: 0.6 oz    Types: 1 Standard drinks or equivalent per week    Comment: socially  . Drug use: No     Allergies   Protonix [pantoprazole sodium]   Review of Systems Review of Systems  All other systems reviewed and are negative.    Physical  Exam Updated Vital Signs BP 121/90 (BP Location: Left Arm)   Pulse 92   Temp 98.4 F (36.9 C) (Oral)   Resp 18   Ht 1.676 m (5' 6" )   Wt 96.2 kg (212 lb)   SpO2 97%   BMI 34.22 kg/m   Physical Exam  Constitutional: He is oriented to person, place, and time. He appears well-developed and well-nourished.  Non-toxic appearance. No distress.  HENT:  Head: Normocephalic and atraumatic.  Eyes: Conjunctivae, EOM and lids are normal. Pupils are equal, round, and reactive to light.  Neck: Normal range of motion. Neck supple. No tracheal deviation present. No thyroid mass present.  Cardiovascular: Normal rate, regular rhythm and normal heart  sounds. Exam reveals no gallop.  No murmur heard. Pulmonary/Chest: Effort normal and breath sounds normal. No stridor. No respiratory distress. He has no decreased breath sounds. He has no wheezes. He has no rhonchi. He has no rales.  Abdominal: Soft. Normal appearance and bowel sounds are normal. He exhibits no distension. There is no tenderness. There is no rebound and no CVA tenderness.  Musculoskeletal: Normal range of motion. He exhibits no edema or tenderness.  Neurological: He is alert and oriented to person, place, and time. He has normal strength. No cranial nerve deficit or sensory deficit. GCS eye subscore is 4. GCS verbal subscore is 5. GCS motor subscore is 6.  Skin: Skin is warm and dry. No abrasion and no rash noted.  Psychiatric: He has a normal mood and affect. His speech is normal and behavior is normal.  Nursing note and vitals reviewed.    ED Treatments / Results  Labs (all labs ordered are listed, but only abnormal results are displayed) Labs Reviewed - No data to display  EKG  EKG Interpretation None       Radiology No results found.  Procedures Procedures (including critical care time)  Medications Ordered in ED Medications - No data to display   Initial Impression / Assessment and Plan / ED Course  I have reviewed the triage vital signs and the nursing notes.  Pertinent labs & imaging results that were available during my care of the patient were reviewed by me and considered in my medical decision making (see chart for details).     Patient with likely influenza and will place on Tamiflu and will follow up with his doctor  Final Clinical Impressions(s) / ED Diagnoses   Final diagnoses:  None    ED Discharge Orders    None       Lacretia Leigh, MD 07/21/17 1319

## 2017-07-21 NOTE — ED Notes (Signed)
3 calls for vitals and no response.

## 2017-07-21 NOTE — ED Triage Notes (Signed)
Pt c/o flu like symptoms over past couple days. Reports woke up today with body aches, v/d. Friend has had same symptoms, but longer so think he got sick from him.

## 2017-07-22 ENCOUNTER — Ambulatory Visit: Payer: 59 | Admitting: Nurse Practitioner

## 2017-07-24 ENCOUNTER — Other Ambulatory Visit (INDEPENDENT_AMBULATORY_CARE_PROVIDER_SITE_OTHER): Payer: 59

## 2017-07-24 ENCOUNTER — Ambulatory Visit (INDEPENDENT_AMBULATORY_CARE_PROVIDER_SITE_OTHER): Payer: 59 | Admitting: Family

## 2017-07-24 ENCOUNTER — Encounter: Payer: Self-pay | Admitting: Family

## 2017-07-24 VITALS — BP 122/80 | HR 112 | Temp 97.7°F | Ht 66.0 in | Wt 216.0 lb

## 2017-07-24 DIAGNOSIS — J111 Influenza due to unidentified influenza virus with other respiratory manifestations: Secondary | ICD-10-CM

## 2017-07-24 DIAGNOSIS — Z720 Tobacco use: Secondary | ICD-10-CM

## 2017-07-24 DIAGNOSIS — E1165 Type 2 diabetes mellitus with hyperglycemia: Secondary | ICD-10-CM

## 2017-07-24 LAB — CBC WITH DIFFERENTIAL/PLATELET
BASOS ABS: 0.1 10*3/uL (ref 0.0–0.1)
BASOS PCT: 0.6 % (ref 0.0–3.0)
EOS ABS: 0.2 10*3/uL (ref 0.0–0.7)
Eosinophils Relative: 1.6 % (ref 0.0–5.0)
HEMATOCRIT: 39.3 % (ref 39.0–52.0)
Hemoglobin: 13.9 g/dL (ref 13.0–17.0)
LYMPHS ABS: 1.8 10*3/uL (ref 0.7–4.0)
LYMPHS PCT: 17 % (ref 12.0–46.0)
MCHC: 35.3 g/dL (ref 30.0–36.0)
MCV: 93.8 fl (ref 78.0–100.0)
MONO ABS: 0.9 10*3/uL (ref 0.1–1.0)
Monocytes Relative: 8.8 % (ref 3.0–12.0)
NEUTROS ABS: 7.5 10*3/uL (ref 1.4–7.7)
NEUTROS PCT: 72 % (ref 43.0–77.0)
PLATELETS: 300 10*3/uL (ref 150.0–400.0)
RBC: 4.18 Mil/uL — ABNORMAL LOW (ref 4.22–5.81)
RDW: 12.5 % (ref 11.5–15.5)
WBC: 10.4 10*3/uL (ref 4.0–10.5)

## 2017-07-24 LAB — COMPREHENSIVE METABOLIC PANEL
ALT: 11 U/L (ref 0–53)
AST: 12 U/L (ref 0–37)
Albumin: 4 g/dL (ref 3.5–5.2)
Alkaline Phosphatase: 72 U/L (ref 39–117)
BILIRUBIN TOTAL: 0.3 mg/dL (ref 0.2–1.2)
BUN: 22 mg/dL (ref 6–23)
CALCIUM: 9.2 mg/dL (ref 8.4–10.5)
CO2: 27 meq/L (ref 19–32)
CREATININE: 0.8 mg/dL (ref 0.40–1.50)
Chloride: 102 mEq/L (ref 96–112)
GFR: 108.94 mL/min (ref >60.00)
Glucose, Bld: 180 mg/dL — ABNORMAL HIGH (ref 70–99)
Potassium: 4.2 mEq/L (ref 3.5–5.1)
SODIUM: 137 meq/L (ref 135–145)
Total Protein: 7.7 g/dL (ref 6.0–8.3)

## 2017-07-24 MED ORDER — VARENICLINE TARTRATE 0.5 MG X 11 & 1 MG X 42 PO MISC: ORAL | 0 refills | Status: DC

## 2017-07-24 NOTE — Progress Notes (Signed)
Brett Walsh is a 52 y.o. male with the following history as recorded in [employer]:  Patient Active Problem List   Diagnosis Date Noted  . Contusion of face 02/06/2017  . GERD (gastroesophageal reflux disease) 12/02/2016  . Medication reaction 10/14/2016  . Depression (emotion) 07/23/2016  . Insomnia 07/23/2016  . Routine general medical examination at a health care facility 06/20/2016  . TMJ (temporomandibular joint syndrome) 01/21/2016  . Slow transit constipation 08/16/2014  . Thrush 07/10/2014  . HIV disease ([place]) 06/20/2014  . Diabetes type 2, uncontrolled ([place]) 06/20/2014    Current Outpatient Medications  Medication Sig Dispense Refill  . abacavir-dolutegravir-lamiVUDine (TRIUMEQ) 600-50-300 MG tablet Take 1 tablet by mouth daily. 90 tablet 3  . Blood Glucose Monitoring Suppl (ONE TOUCH ULTRA MINI) w/Device KIT Use meter to check blood sugars 1-4 times daily as instructed. 1 each 0  . Exenatide ER (BYDUREON) 2 MG PEN Inject 2 mg into the skin once a week. 4 each 0  . glucose blood (ONE TOUCH ULTRA TEST) test strip Use one strip per test. Test blood sugars 1-4 times daily as instructed. 100 each 12  . Insulin Detemir (LEVEMIR FLEXTOUCH) 100 UNIT/ML Pen Inject 5 Units into the skin daily at 10 pm. Taper up as directed 15 mL 1  . Insulin Pen Needle (PEN NEEDLES) 31G X 8 MM MISC Use to inject insulin as needed. 100 each 5  . Lancets Misc. (ONE TOUCH SURESOFT) MISC Use 1 lancet per test. Test blood sugars 1-4 times per day as instructed. 1 each 1  . loratadine (CLARITIN) 10 MG tablet Take 10 mg by mouth daily as needed for allergies.    Marland Kitchen oseltamivir (TAMIFLU) 75 MG capsule Take 1 capsule (75 mg total) by mouth every 12 (twelve) hours. 10 capsule 0  . varenicline (CHANTIX STARTING MONTH PAK) 0.5 MG X 11 & 1 MG X 42 tablet Take as directed 53 tablet 0   No current facility-administered medications for this visit.     Allergies: Metformin and related and Protonix [pantoprazole  sodium]  Past Medical History:  Diagnosis Date  . Deviated septum   . Diabetes mellitus without complication ([place])   . HIV disease ([place])     No past surgical history on file.  Family History  Problem Relation Age of Onset  . Diabetes Brother   . Diabetes Mother   . Diabetes Father   . Heart failure Father     Social History   Tobacco Use  . Smoking status: Light Tobacco Smoker    Packs/day: 0.50    Years: 36.00    Pack years: 18.00    Types: Cigarettes, E-cigarettes  . Smokeless tobacco: Current User  Substance Use Topics  . Alcohol use: Yes    Alcohol/week: 0.6 oz    Types: 1 Standard drinks or equivalent per week    Comment: socially    Subjective:  Patient presents for follow-up on Type 2 diabetes; now up to 40 units of Levemir nightly and Bydureon weekly; has realized that if his blood sugars go below 300 he starts to feel weak and shaky; notes that both his sister and dad had similar issues with their diabetes and states that neither his sister or dad could tolerate "normal" blood sugars; is feeling that GI issues have resolved since stopping Metformin and is pleased to see he has gained 4 pounds since last office visit in January.  Was seen in the ER earlier this week and diagnosed with flu; was started  on Tamiflu and is feeling much better; needs notes for work since he has been out for past 4 days; Also notes he is ready to quit smoking and would like to discuss medication options; has tried going "cold [place]" and was not successful;   Objective:  Vitals:   07/24/17 0944  BP: 122/80  Pulse: (!) 112  Temp: 97.7 F (36.5 C)  TempSrc: Oral  SpO2: 99%  Weight: 216 lb (98 kg)  Height: 5' 6" (1.676 m)    General: Well developed, well nourished, in no acute distress  Skin : Warm and dry.  Head: Normocephalic and atraumatic  Eyes: Sclera and conjunctiva clear; pupils round and reactive to light; extraocular movements intact  Ears: External normal; canals clear;  tympanic membranes normal  Oropharynx: Pink, supple. No suspicious lesions  Neck: Supple without thyromegaly, adenopathy  Lungs: Respirations unlabored; clear to auscultation bilaterally without wheeze, rales, rhonchi  CVS exam: normal rate and regular rhythm.  Neurologic: Alert and oriented; speech intact; face symmetrical; moves all extremities well; CNII-XII intact without focal deficit   Assessment:  1. Uncontrolled type 2 diabetes mellitus with hyperglycemia ([place])   2. Tobacco abuse   3. Influenza     Plan:  1. Will refer to endocrine for further evaluation; am concerned about his inability to tolerate "normal" blood sugars; he is in agreement about referral; 2. Rx for Chantix- use as directed; choose a quit date once he has been on Chantix for 2 weeks; call back for continuing month Rx; congratulated patient on commitment to quit smoking. 3. Check CBC today; appears to be responding to Tamiflu; if WBC is elevated, will consider antibiotic with underlying HIV; work note given as requested;   No Follow-up on file.  Orders Placed This Encounter  Procedures  . CBC w/Diff    Standing Status:   Future    Number of Occurrences:   1    Standing Expiration Date:   07/24/2018  . Comp Met (CMET)    Standing Status:   Future    Number of Occurrences:   1    Standing Expiration Date:   07/24/2018  . Ambulatory referral to Endocrinology    Referral Priority:   Routine    Referral Type:   Consultation    Referral Reason:   Specialty Services Required    Number of Visits Requested:   1    Requested Prescriptions   Signed Prescriptions Disp Refills  . varenicline (CHANTIX STARTING MONTH PAK) 0.5 MG X 11 & 1 MG X 42 tablet 53 tablet 0    Sig: Take as directed

## 2017-07-24 NOTE — Patient Instructions (Signed)
Steps to Quit Smoking Smoking tobacco can be bad for your health. It can also affect almost every organ in your body. Smoking puts you and people around you at risk for many serious long-lasting (chronic) diseases. Quitting smoking is hard, but it is one of the best things that you can do for your health. It is never too late to quit. What are the benefits of quitting smoking? When you quit smoking, you lower your risk for getting serious diseases and conditions. They can include:  Lung cancer or lung disease.  Heart disease.  Stroke.  Heart attack.  Not being able to have children (infertility).  Weak bones (osteoporosis) and broken bones (fractures).  If you have coughing, wheezing, and shortness of breath, those symptoms may get better when you quit. You may also get sick less often. If you are pregnant, quitting smoking can help to lower your chances of having a baby of low birth weight. What can I do to help me quit smoking? Talk with your doctor about what can help you quit smoking. Some things you can do (strategies) include:  Quitting smoking totally, instead of slowly cutting back how much you smoke over a period of time.  Going to in-person counseling. You are more likely to quit if you go to many counseling sessions.  Using resources and support systems, such as: ? Online chats with a counselor. ? Phone quitlines. ? Printed self-help materials. ? Support groups or group counseling. ? Text messaging programs. ? Mobile phone apps or applications.  Taking medicines. Some of these medicines may have nicotine in them. If you are pregnant or breastfeeding, do not take any medicines to quit smoking unless your doctor says it is okay. Talk with your doctor about counseling or other things that can help you.  Talk with your doctor about using more than one strategy at the same time, such as taking medicines while you are also going to in-person counseling. This can help make  quitting easier. What things can I do to make it easier to quit? Quitting smoking might feel very hard at first, but there is a lot that you can do to make it easier. Take these steps:  Talk to your family and friends. Ask them to support and encourage you.  Call phone quitlines, reach out to support groups, or work with a counselor.  Ask people who smoke to not smoke around you.  Avoid places that make you want (trigger) to smoke, such as: ? Bars. ? Parties. ? Smoke-break areas at work.  Spend time with people who do not smoke.  Lower the stress in your life. Stress can make you want to smoke. Try these things to help your stress: ? Getting regular exercise. ? Deep-breathing exercises. ? Yoga. ? Meditating. ? Doing a body scan. To do this, close your eyes, focus on one area of your body at a time from head to toe, and notice which parts of your body are tense. Try to relax the muscles in those areas.  Download or buy apps on your mobile phone or tablet that can help you stick to your quit plan. There are many free apps, such as QuitGuide from the CDC (Centers for Disease Control and Prevention). You can find more support from smokefree.gov and other websites.  This information is not intended to replace advice given to you by your health care provider. Make sure you discuss any questions you have with your health care provider. Document Released: 03/15/2009 Document   Revised: 01/15/2016 Document Reviewed: 10/03/2014 Elsevier Interactive Patient Education  2018 Elsevier Inc.  

## 2017-07-27 ENCOUNTER — Other Ambulatory Visit: Payer: Self-pay

## 2017-07-27 MED ORDER — EXENATIDE ER 2 MG ~~LOC~~ PEN: 2.0000 mg | PEN_INJECTOR | SUBCUTANEOUS | 3 refills | Status: DC

## 2017-08-07 ENCOUNTER — Ambulatory Visit: Payer: 59 | Admitting: Nurse Practitioner

## 2017-08-31 ENCOUNTER — Encounter: Payer: Self-pay | Admitting: Endocrinology

## 2017-08-31 ENCOUNTER — Ambulatory Visit (INDEPENDENT_AMBULATORY_CARE_PROVIDER_SITE_OTHER): Payer: 59 | Admitting: Endocrinology

## 2017-08-31 VITALS — BP 124/78 | HR 108 | Ht 66.0 in | Wt 213.0 lb

## 2017-08-31 DIAGNOSIS — E1165 Type 2 diabetes mellitus with hyperglycemia: Secondary | ICD-10-CM

## 2017-08-31 DIAGNOSIS — Z794 Long term (current) use of insulin: Secondary | ICD-10-CM | POA: Diagnosis not present

## 2017-08-31 MED ORDER — SITAGLIP PHOS-METFORMIN HCL ER 50-1000 MG PO TB24: ORAL_TABLET | ORAL | 2 refills | Status: DC

## 2017-08-31 NOTE — Progress Notes (Signed)
Patient ID: Brett Walsh, male   DOB: 1966-05-21, 52 y.o.   MRN: [id number]           Reason for Appointment: Consultation for Type 2 Diabetes  Referring physician: Dr. Valere Dross   History of Present Illness:          Date of diagnosis of type 2 diabetes mellitus: 2012       Background history:   At the time of diagnosis he weighed about 268 pounds His blood sugars were markedly increased at that time when he was hospitalized for treatment and sent home on insulin, metformin and Avandia He says that however he was able to lose weight with improved diet and was able to get off medications and insulin  He has been on various treatment since a couple of years later but detailed records are not available, moved from [place] His A1c has been as high as 14.5 in 2017 when he was prescribed Basaglar and Humalog although he does not remember this Subsequently his blood sugar control has been quite variable He was first started on metformin in 06/2016 and the dose was progressively increased  Recent history:   INSULIN regimen is: Levemir 50  units at night daily      Non-insulin hypoglycemic drugs the patient is taking are: Bydureon 2 mg weekly  Current history of diabetes, recent management, blood sugar patterns and problems identified:  His blood sugars apparently went up when he was taken off metformin in 03/2017 because of diarrhea.  Apparently he was taking 3000 mg total of regular metformin at that time  However apparently when he took metformin a few years ago he did not remember having diarrhea   Subsequently his blood sugar went up very significantly and in January his glucose was 620 on the lab.  However despite high sugars he was only having some increased thirst and urination and no fatigue or blurred vision  Also had lost about 20 pounds with his high sugars when he was started on Levemir insulin in 1/19  Although he was started only on 5 units of Levemir he has  continued to increase the dose and for some time is been taking 50 units  However he thinks his blood sugars are not improved on our still close to 300 in the mornings and 500 after dinner  He does state thirsty and has a dry mouth but is trying to drink mostly water.  Only occasionally will have a regular soft drink but he does drink up to 4 cups of milk a day  He also states that he does not feel as energetic when his blood sugars are below 300 and intermittent blood sugars are near normal he feels sleepy and tired        Side effects from medications have been: Diarrhea from high-dose metformin  Compliance with the medical regimen: Fair  Hypoglycemia: None   Glucose monitoring:  done  2 times a day         Glucometer: Ultra  Blood Glucose readings by recall    PREMEAL Breakfast Lunch Dinner Bedtime  Overall   Glucose range: 275-300      Median:        POST-MEAL PC Breakfast PC Lunch PC Dinner  Glucose range:   500  Median:      Self-care: The diet that the patient has been following is: tries to limit sugar, may occasionally have fried food.      Usually eating breakfast at 5  AM and dinner at 6:30 PM                Dietician visit, most recent: Never               Exercise: none and has a sedentary job  Weight history:  Wt Readings from Last 3 Encounters:  08/31/17 213 lb (96.6 kg)  07/24/17 216 lb (98 kg)  07/21/17 212 lb (96.2 kg)    Glycemic control:   Lab Results  Component Value Date   HGBA1C 13.8 (H) 06/12/2017   HGBA1C 6.7 02/06/2017   HGBA1C 5.8 12/02/2016   Lab Results  Component Value Date   MICROALBUR <0.7 05/20/2016   LDLCALC 89 07/09/2016   CREATININE 0.80 07/24/2017   Lab Results  Component Value Date   MICRALBCREAT 2.0 05/20/2016    No results found for: FRUCTOSAMINE    Allergies as of 08/31/2017      Reactions   Metformin And Related    GI upset   Protonix [pantoprazole Sodium] Hives      Medication List        Accurate as  of 08/31/17  4:59 PM. Always use your most recent med list.          abacavir-dolutegravir-lamiVUDine 600-50-300 MG tablet Commonly known as:  TRIUMEQ Take 1 tablet by mouth daily.   Exenatide ER 2 MG Pen Commonly known as:  BYDUREON Inject 2 mg into the skin once a week.   glucose blood test strip Commonly known as:  ONE TOUCH ULTRA TEST Use one strip per test. Test blood sugars 1-4 times daily as instructed.   Insulin Detemir 100 UNIT/ML Pen Commonly known as:  LEVEMIR FLEXTOUCH Inject 5 Units into the skin daily at 10 pm. Taper up as directed   loratadine 10 MG tablet Commonly known as:  CLARITIN Take 10 mg by mouth daily as needed for allergies.   ONE TOUCH SURESOFT Misc Use 1 lancet per test. Test blood sugars 1-4 times per day as instructed.   ONE TOUCH ULTRA MINI w/Device Kit Use meter to check blood sugars 1-4 times daily as instructed.   Pen Needles 31G X 8 MM Misc Use to inject insulin as needed.   SitaGLIPtin-MetFORMIN HCl 50-1000 MG Tb24 Commonly known as:  JANUMET XR 2 tabs qd   varenicline 0.5 MG X 11 & 1 MG X 42 tablet Commonly known as:  CHANTIX STARTING MONTH PAK Take as directed       Allergies:  Allergies  Allergen Reactions  . Metformin And Related     GI upset  . Protonix [Pantoprazole Sodium] Hives    Past Medical History:  Diagnosis Date  . Deviated septum   . Diabetes mellitus without complication ([place])   . HIV disease [facility])     Past Surgical History:  Procedure Laterality Date  . NASAL SEPTUM SURGERY  1981, [id number]    Family History  Problem Relation Age of Onset  . Diabetes Brother   . Diabetes Mother   . Diabetes Father   . Heart failure Father   . Diabetes Sister     Social History:  reports that he has been smoking cigarettes and e-cigarettes.  He has a 18.00 pack-year smoking history. He uses smokeless tobacco. He reports that he drinks about 0.6 oz of alcohol per week. He reports that he does not use  drugs.   Review of Systems  Constitutional: Negative for weight loss.  HENT: Negative for headaches.   Eyes: Negative  for blurred vision.  Respiratory: Negative for shortness of breath.   Cardiovascular: Negative for leg swelling.  Gastrointestinal: Negative for constipation and abdominal pain.  Endocrine: Negative for fatigue and abnormal weight gain.  Genitourinary: Positive for nocturia.  Musculoskeletal: Negative for joint pain.  Skin: Negative for rash.  Neurological: Negative for numbness and tingling.    Lipid history: Has never been on statin drugs    Lab Results  Component Value Date   CHOL 165 07/09/2016   HDL 39 (L) 07/09/2016   LDLCALC 89 07/09/2016   TRIG 186 (H) 07/09/2016   CHOLHDL 4.2 07/09/2016           Hypertension: Not present:  BP Readings from Last 3 Encounters:  08/31/17 124/78  07/24/17 122/80  07/21/17 121/90    Most recent eye exam was in 10/18  Most recent foot exam: 08/2017    LABS:  No visits with results within 1 Week(s) from this visit.  Latest known visit with results is:  Appointment on 07/24/2017  Component Date Value Ref Range Status  . Sodium 07/24/2017 137  135 - 145 mEq/L Final  . Potassium 07/24/2017 4.2  3.5 - 5.1 mEq/L Final  . Chloride 07/24/2017 102  96 - 112 mEq/L Final  . CO2 07/24/2017 27  19 - 32 mEq/L Final  . Glucose, Bld 07/24/2017 180* 70 - 99 mg/dL Final  . BUN 07/24/2017 22  6 - 23 mg/dL Final  . Creatinine, Ser 07/24/2017 0.80  0.40 - 1.50 mg/dL Final  . Total Bilirubin 07/24/2017 0.3  0.2 - 1.2 mg/dL Final  . Alkaline Phosphatase 07/24/2017 72  39 - 117 U/L Final  . AST 07/24/2017 12  0 - 37 U/L Final  . ALT 07/24/2017 11  0 - 53 U/L Final  . Total Protein 07/24/2017 7.7  6.0 - 8.3 g/dL Final  . Albumin 07/24/2017 4.0  3.5 - 5.2 g/dL Final  . Calcium 07/24/2017 9.2  8.4 - 10.5 mg/dL Final  . GFR 07/24/2017 108.94  >60.00 mL/min Final  . WBC 07/24/2017 10.4  4.0 - 10.5 K/uL Final  . RBC 07/24/2017  4.18* 4.22 - 5.81 Mil/uL Final  . Hemoglobin 07/24/2017 13.9  13.0 - 17.0 g/dL Final  . HCT 07/24/2017 39.3  39.0 - 52.0 % Final  . MCV 07/24/2017 93.8  78.0 - 100.0 fl Final  . MCHC 07/24/2017 35.3  30.0 - 36.0 g/dL Final  . RDW 07/24/2017 12.5  11.5 - 15.5 % Final  . Platelets 07/24/2017 300.0  150.0 - 400.0 K/uL Final  . Neutrophils Relative % 07/24/2017 72.0  43.0 - 77.0 % Final  . Lymphocytes Relative 07/24/2017 17.0  12.0 - 46.0 % Final  . Monocytes Relative 07/24/2017 8.8  3.0 - 12.0 % Final  . Eosinophils Relative 07/24/2017 1.6  0.0 - 5.0 % Final  . Basophils Relative 07/24/2017 0.6  0.0 - 3.0 % Final  . Neutro Abs 07/24/2017 7.5  1.4 - 7.7 K/uL Final  . Lymphs Abs 07/24/2017 1.8  0.7 - 4.0 K/uL Final  . Monocytes Absolute 07/24/2017 0.9  0.1 - 1.0 K/uL Final  . Eosinophils Absolute 07/24/2017 0.2  0.0 - 0.7 K/uL Final  . Basophils Absolute 07/24/2017 0.1  0.0 - 0.1 K/uL Final    Physical Examination:  BP 124/78 (BP Location: Left Arm, Patient Position: Sitting, Cuff Size: Normal)   Pulse (!) 108   Ht 5' 6"  (1.676 m)   Wt 213 lb (96.6 kg)   SpO2 98%  BMI 34.38 kg/m   GENERAL:         Patient has generalized obesity.    HEENT:         Eye exam shows normal external appearance.  Fundus exam shows no retinopathy.  Oral exam shows normal mucosa .  NECK:   There is no lymphadenopathy Thyroid is not enlarged and no nodules felt.  Carotids are normal to palpation and no bruit heard  LUNGS:         Chest is symmetrical. Lungs are clear to auscultation.Marland Kitchen   HEART:         Heart sounds:  S1 and S2 are normal. No murmur or click heard., no S3 or S4.   ABDOMEN:   There is no distention present. Liver and spleen are not palpable.  No other mass or tenderness present.    NEUROLOGICAL:   Ankle jerks are 1+ bilaterally.    Diabetic Foot Exam - Simple   Simple Foot Form Diabetic Foot exam was performed with the following findings:  Yes   Visual Inspection No deformities, no  ulcerations, no other skin breakdown bilaterally:  Yes Sensation Testing Intact to touch and monofilament testing bilaterally:  Yes Pulse Check Posterior Tibialis and Dorsalis pulse intact bilaterally:  Yes Comments            Vibration sense is mildly reduced in distal first toes. MUSCULOSKELETAL:  There is no swelling or deformity of the peripheral joints.     EXTREMITIES:     There is no edema.  SKIN:       No rash or lesions of concern.        ASSESSMENT:  Diabetes type 2, uncontrolled   Patient has had marked increase in blood sugar a few months ago with stopping metformin although he was taking unusually high dose of 3000 mg a day Currently even with taking 50 units of basal insulin along with Bydureon his blood sugars are ranging from 300-500 He appears to be significantly insulin resistant but also appears to have responded very well controlled with metformin  Although he is reportedly intolerant to metformin previously, he was taking regular metformin and did not have side effects until he went up over 1500 mg a day He has not had enough diabetes education and is not planning his meals well Also can cut back on simple sugar intake especially with drinking a lot of milk  Currently not exercising and not losing weight  Complications of diabetes: None evident, reportedly no retinopathy, no clear signs or symptoms of neuropathy.  Needs follow-up microalbumin level  No prior history of hyperlipidemia but will need follow-up fasting levels once his blood sugars are better controlled, does have history of relatively low HDL and mildly increased triglycerides   PLAN:    Trial of Janumet XR.  Most likely he should be able to tolerate the extended release metformin in this preparation  He will start with 1000 mg once a day with his main meal and after 1 week if tolerated go up to 2 tablets daily, co-pay card given  Consultation with dietitian for meal planning  Cut back on  high glycemic index foods and drinks such as milk  Start walking for exercise  He will be given a One Touch Verio monitor instead of the One Touch ultra since he has some difficulty getting adequate blood on the test strip  If his blood sugars come down significantly he can start tapering down his Levemir once blood sugars  are below 200 fasting, he can reduce the dose by 10 units at a time  Continue Bydureon for now and explained to him that this is a GLP-1 drug and not insulin  Follow-up in 4 weeks for reassessment  Discussed that if he is not responding to adding Janumet he may need to be on a different insulin regimen including mealtime injections  He will start checking blood sugars more consistently after meals including some readings after lunch  Patient Instructions  Take Janumet 1 at dinner for  1 week then add 1 at lunch  Check blood sugars on waking up  3-4/7  Also check blood sugars about 2 hours after a meal and do this after different meals by rotation  Recommended blood sugar levels on waking up is 90-130 and about 2 hours after meal is 130-160  Please bring your blood sugar monitor to each visit, thank you  Walk daily  Reduce Levemir by 10 when sugar <200    Consultation note has been sent to the referring physician  Elayne Snare 08/31/2017, 4:59 PM   Note: This office note was prepared with Dragon voice recognition system technology. Any transcriptional errors that result from this process are unintentional.

## 2017-08-31 NOTE — Patient Instructions (Signed)
Take Janumet 1 at dinner for  1 week then add 1 at lunch  Check blood sugars on waking up  3-4/7  Also check blood sugars about 2 hours after a meal and do this after different meals by rotation  Recommended blood sugar levels on waking up is 90-130 and about 2 hours after meal is 130-160  Please bring your blood sugar monitor to each visit, thank you  Walk daily  Reduce Levemir by 10 when sugar <200

## 2017-09-22 ENCOUNTER — Other Ambulatory Visit: Payer: Self-pay | Admitting: Infectious Diseases

## 2017-09-22 DIAGNOSIS — B20 Human immunodeficiency virus [HIV] disease: Secondary | ICD-10-CM

## 2017-09-23 ENCOUNTER — Other Ambulatory Visit: Payer: Self-pay

## 2017-09-23 DIAGNOSIS — E11649 Type 2 diabetes mellitus with hypoglycemia without coma: Secondary | ICD-10-CM

## 2017-09-23 MED ORDER — INSULIN DETEMIR 100 UNIT/ML FLEXPEN: 5.0000 [IU] | PEN_INJECTOR | Freq: Every day | SUBCUTANEOUS | 1 refills | Status: DC

## 2017-09-28 ENCOUNTER — Other Ambulatory Visit (INDEPENDENT_AMBULATORY_CARE_PROVIDER_SITE_OTHER): Payer: 59

## 2017-09-28 DIAGNOSIS — Z794 Long term (current) use of insulin: Secondary | ICD-10-CM | POA: Diagnosis not present

## 2017-09-28 DIAGNOSIS — E1165 Type 2 diabetes mellitus with hyperglycemia: Secondary | ICD-10-CM

## 2017-09-28 LAB — MICROALBUMIN / CREATININE URINE RATIO
Creatinine,U: 104.8 mg/dL
Microalb Creat Ratio: 1.4 mg/g (ref 0.0–30.0)
Microalb, Ur: 1.5 mg/dL (ref 0.0–1.9)

## 2017-09-28 LAB — BASIC METABOLIC PANEL
BUN: 23 mg/dL (ref 6–23)
CALCIUM: 9.3 mg/dL (ref 8.4–10.5)
CO2: 27 meq/L (ref 19–32)
CREATININE: 0.86 mg/dL (ref 0.40–1.50)
Chloride: 99 mEq/L (ref 96–112)
GFR: 100.14 mL/min (ref >60.00)
GLUCOSE: 330 mg/dL — AB (ref 70–99)
Potassium: 4.4 mEq/L (ref 3.5–5.1)
Sodium: 135 mEq/L (ref 135–145)

## 2017-09-29 LAB — FRUCTOSAMINE: Fructosamine: 436 umol/L — ABNORMAL HIGH (ref 0–285)

## 2017-10-01 ENCOUNTER — Other Ambulatory Visit: Payer: Self-pay

## 2017-10-01 ENCOUNTER — Ambulatory Visit (INDEPENDENT_AMBULATORY_CARE_PROVIDER_SITE_OTHER): Payer: 59 | Admitting: Endocrinology

## 2017-10-01 ENCOUNTER — Encounter: Payer: Self-pay | Admitting: Endocrinology

## 2017-10-01 VITALS — BP 132/88 | HR 110 | Ht 66.0 in | Wt 223.0 lb

## 2017-10-01 DIAGNOSIS — E1169 Type 2 diabetes mellitus with other specified complication: Secondary | ICD-10-CM | POA: Diagnosis not present

## 2017-10-01 DIAGNOSIS — E669 Obesity, unspecified: Secondary | ICD-10-CM | POA: Diagnosis not present

## 2017-10-01 DIAGNOSIS — E1165 Type 2 diabetes mellitus with hyperglycemia: Secondary | ICD-10-CM

## 2017-10-01 DIAGNOSIS — Z794 Long term (current) use of insulin: Secondary | ICD-10-CM

## 2017-10-01 MED ORDER — INSULIN ASPART 100 UNIT/ML FLEXPEN: PEN_INJECTOR | SUBCUTANEOUS | 1 refills | Status: DC

## 2017-10-01 MED ORDER — GLUCOSE BLOOD VI STRP: ORAL_STRIP | 12 refills | Status: DC

## 2017-10-01 MED ORDER — FREESTYLE LIBRE 14 DAY READER DEVI: 1.0000 | Freq: Once | 0 refills | Status: AC

## 2017-10-01 MED ORDER — CANAGLIFLOZIN 100 MG PO TABS: ORAL_TABLET | ORAL | 3 refills | Status: DC

## 2017-10-01 MED ORDER — FREESTYLE LIBRE 14 DAY SENSOR MISC: 1.0000 [IU] | 4 refills | Status: DC

## 2017-10-01 MED ORDER — INSULIN DEGLUDEC 200 UNIT/ML ~~LOC~~ SOPN: 60.0000 [IU] | PEN_INJECTOR | Freq: Every day | SUBCUTANEOUS | 1 refills | Status: DC

## 2017-10-01 NOTE — Progress Notes (Signed)
Patient ID: Brett Walsh, male   DOB: 1966/04/22, 52 y.o.   MRN: [id number]           Reason for Appointment: Follow-up for Type 2 Diabetes  Referring physician: Dr. Valere Dross   History of Present Illness:          Date of diagnosis of type 2 diabetes mellitus: 2012       Background history:   At the time of diagnosis he weighed about 268 pounds His blood sugars were markedly increased at that time when he was hospitalized for treatment and sent home on insulin, metformin and Avandia He says that however he was able to lose weight with improved diet and was able to get off medications and insulin  He has been on various treatment since a couple of years later but detailed records are not available, moved from [place] His A1c has been as high as 14.5 in 2017 when he was prescribed Basaglar and Humalog although he does not remember this Subsequently his blood sugar control has been quite variable He was first started on metformin in 06/2016 and the dose was progressively increased  Recent history:   INSULIN regimen is: Levemir 15 units a.m.--30  units p.m.    Non-insulin hypoglycemic drugs the patient is taking are: Bydureon 2 mg weekly, Janumet XR 100/1000 daily  Most recent A1c was 13.8 Fructosamine is now 436  Current history of diabetes, recent management, blood sugar patterns and problems identified:  He was started on Janumet XR on his initial consultation since apparently he had previously responded to metformin but had diarrhea with large doses  He was also continued on Levemir but he is taking a total of 45 units only instead of 50 on the last visit  His blood sugars do not appear to be much better with its average over 300  However previously was having readings after meals up to 500  He now is complaining of constipation with taking Janumet XR  His weight has gone up compared to last month  He is trying to be a little more active but no formal exercise He  thinks he is usually trying to watch his diet but has not seen the dietitian as instructed Still has some fatigue        Side effects from medications have been: Diarrhea from high-dose metformin  Compliance with the medical regimen: Fair  Hypoglycemia: None   Glucose monitoring:  done  2 times a day         Glucometer: Ultra  Blood Glucose readings by download   PREMEAL Breakfast Lunch Dinner Bedtime  Overall   Glucose range:  14 0-308      Median:  250   410   295+/-96   POST-MEAL PC Breakfast PC Lunch PC Dinner  Glucose range:    280-498  Median:      Self-care: The diet that the patient has been following is: tries to limit sugar, may occasionally have fried food.      Usually eating breakfast at 5 AM and dinner at 6:30 PM                Dietician visit, most recent: Never               Exercise: walks little more recently otherwise has sedentary job  Weight history:  Wt Readings from Last 3 Encounters:  10/01/17 223 lb (101.2 kg)  08/31/17 213 lb (96.6 kg)  07/24/17 216 lb (98 kg)  Glycemic control:   Lab Results  Component Value Date   HGBA1C 13.8 (H) 06/12/2017   HGBA1C 6.7 02/06/2017   HGBA1C 5.8 12/02/2016   Lab Results  Component Value Date   MICROALBUR 1.5 09/28/2017   LDLCALC 89 07/09/2016   CREATININE 0.86 09/28/2017   Lab Results  Component Value Date   MICRALBCREAT 1.4 09/28/2017    Lab Results  Component Value Date   FRUCTOSAMINE 436 (H) 09/28/2017      Allergies as of 10/01/2017      Reactions   Metformin And Related    GI upset   Protonix [pantoprazole Sodium] Hives      Medication List        Accurate as of 10/01/17  8:50 PM. Always use your most recent med list.          canagliflozin 100 MG Tabs tablet Commonly known as:  INVOKANA 1 tablet before breakfast   chlorhexidine 0.12 % solution Commonly known as:  PERIDEX Use as directed 15 mLs in the mouth or throat 2 (two) times daily.   Exenatide ER 2 MG  Pen Commonly known as:  BYDUREON Inject 2 mg into the skin once a week.   FREESTYLE LIBRE 14 DAY READER Devi 1 Device by Does not apply route once for 1 dose.   FREESTYLE LIBRE 14 DAY SENSOR Misc 1 Units by Does not apply route every 14 (fourteen) days.   glucose blood test strip Commonly known as:  ONE TOUCH ULTRA TEST Use one strip per test. Test blood sugars 1-4 times daily as instructed.   insulin aspart 100 UNIT/ML FlexPen Commonly known as:  NOVOLOG FLEXPEN 10 UNITS AC TID   Insulin Degludec 200 UNIT/ML Sopn Commonly known as:  TRESIBA FLEXTOUCH Inject 60 Units into the skin at bedtime.   Insulin Detemir 100 UNIT/ML Pen Commonly known as:  LEVEMIR FLEXTOUCH Inject 5 Units into the skin daily at 10 pm. Taper up as directed   loratadine 10 MG tablet Commonly known as:  CLARITIN Take 10 mg by mouth daily as needed for allergies.   ONE TOUCH SURESOFT Misc Use 1 lancet per test. Test blood sugars 1-4 times per day as instructed.   ONE TOUCH ULTRA MINI w/Device Kit Use meter to check blood sugars 1-4 times daily as instructed.   Pen Needles 31G X 8 MM Misc Use to inject insulin as needed.   SitaGLIPtin-MetFORMIN HCl 50-1000 MG Tb24 Commonly known as:  JANUMET XR 2 tabs qd   TRIUMEQ 600-50-300 MG tablet Generic drug:  abacavir-dolutegravir-lamiVUDine TAKE 1 TABLET BY MOUTH DAILY   varenicline 0.5 MG X 11 & 1 MG X 42 tablet Commonly known as:  CHANTIX STARTING MONTH PAK Take as directed       Allergies:  Allergies  Allergen Reactions  . Metformin And Related     GI upset  . Protonix [Pantoprazole Sodium] Hives    Past Medical History:  Diagnosis Date  . Deviated septum   . Diabetes mellitus without complication ([place])   . HIV disease [facility])     Past Surgical History:  Procedure Laterality Date  . NASAL SEPTUM SURGERY  1981, [id number]    Family History  Problem Relation Age of Onset  . Diabetes Brother   . Diabetes Mother   . Diabetes Father    . Heart failure Father   . Diabetes Sister     Social History:  reports that he has been smoking cigarettes and e-cigarettes.  He has a 18.00  pack-year smoking history. He uses smokeless tobacco. He reports that he drinks about 0.6 oz of alcohol per week. He reports that he does not use drugs.   Review of Systems  Lipid history: Has never been on statin drugs    Lab Results  Component Value Date   CHOL 165 07/09/2016   HDL 39 (L) 07/09/2016   LDLCALC 89 07/09/2016   TRIG 186 (H) 07/09/2016   CHOLHDL 4.2 07/09/2016           Hypertension: Not present:  BP Readings from Last 3 Encounters:  10/01/17 132/88  08/31/17 124/78  07/24/17 122/80    Most recent eye exam was in 10/18  Most recent foot exam: 08/2017    LABS:  Lab on 09/28/2017  Component Date Value Ref Range Status  . Microalb, Ur 09/28/2017 1.5  0.0 - 1.9 mg/dL Final  . Creatinine,U 09/28/2017 104.8  mg/dL Final  . Microalb Creat Ratio 09/28/2017 1.4  0.0 - 30.0 mg/g Final  . Fructosamine 09/28/2017 436* 0 - 285 umol/L Final   Comment: Published reference interval for apparently healthy subjects between age 38 and 41 is 50 - 285 umol/L and in a poorly controlled diabetic population is 228 - 563 umol/L with a mean of 396 umol/L.   Marland Kitchen Sodium 09/28/2017 135  135 - 145 mEq/L Final  . Potassium 09/28/2017 4.4  3.5 - 5.1 mEq/L Final  . Chloride 09/28/2017 99  96 - 112 mEq/L Final  . CO2 09/28/2017 27  19 - 32 mEq/L Final  . Glucose, Bld 09/28/2017 330* 70 - 99 mg/dL Final  . BUN 09/28/2017 23  6 - 23 mg/dL Final  . Creatinine, Ser 09/28/2017 0.86  0.40 - 1.50 mg/dL Final  . Calcium 09/28/2017 9.3  8.4 - 10.5 mg/dL Final  . GFR 09/28/2017 100.14  >60.00 mL/min Final    Physical Examination:  BP 132/88 (BP Location: Left Arm, Patient Position: Sitting, Cuff Size: Normal)   Pulse (!) 110   Ht 5' 6"  (1.676 m)   Wt 223 lb (101.2 kg)   SpO2 98%   BMI 35.99 kg/m          ASSESSMENT:  Diabetes type 2,  uncontrolled   See history of present illness for detailed discussion of current diabetes management, blood sugar patterns and problems identified  Although he had previously reported significant improvement on metformin despite starting back on Janumet XR his blood sugars are only slightly better He is still having readings around 250 fasting and well over 300 nonfasting on an average This makes it likely that he is significantly insulin deficient and somewhat insulin resistant also With no significant change in insulin he has gained a little weight His diet is reasonably good Currently having some difficulty monitoring his blood sugars because of sensitivity of his fingersticks   Not sure if he is benefiting from [facility] and is also concerned about skin nodules at the site of injection and occasional bleeding   PLAN:    Trial of INVOKANA which apparently had taken quite some time before without any adverse effects Discussed action of SGLT 2 drugs on lowering glucose by decreasing kidney absorption of glucose, benefits of weight loss and lower blood pressure, possible side effects including candidiasis and dosage regimen   He will also need to be taking a large amount of insulin overall and increase basal insulin to 60 units a day  With his finishing his Levemir soon he will be switched to [place] 60 units  daily  Reviewed adjustment of the [place] based on fasting blood sugar patterns every 3 days and he was given a flow chart to use to adjust the dose until his next visit  Today discussed in detail the need for mealtime insulin to cover postprandial spikes, action of mealtime insulin, use of the insulin pen, timing and action of the rapid acting insulin as well as starting dose and dosage titration to target the two-hour reading of under 180  For this reason he will be on NOVOLOG to start with 6 units with breakfast and supper and 10 units at lunchtime which is his main meal  He was  given specific guidelines on a mealtime insulin handout for adjusting his NovoLog by 2 units based on his postprandial target of 180  For this reason he will need to check his blood sugars more often and discussed the use of the Freestyle LIBRE sensor which he can try if he has adequate coverage for this, discussed how this would work and explained how to use this in general  Follow-up with diabetes educator in 2 weeks  Follow-up in the office in 3 to 4 weeks for repeat A1c  May stop Bydureon and Janumet since he has only minimal benefit from these  However may consider using regular metformin since he tends to have constipation with the XR   Patient Instructions  Levemir 30 units 2x daily until finished and then switch to [place] once a day starting with 60 units and adjust as discussed  NOVOLOG: Take described before eating starting with 6 units before breakfast and supper and 10 units before lunch and adjust as discussed  Stop Bydureon and Janumet  Take Invokana 1 pill in the morning  Blood sugar targets under 130 before meals and at least under 180, 2 hours or so after meals  Counseling time on subjects discussed in assessment and plan sections is over 50% of today's 25 minute visit     Elayne Snare 10/01/2017, 8:50 PM   Note: This office note was prepared with Dragon voice recognition system technology. Any transcriptional errors that result from this process are unintentional.

## 2017-10-01 NOTE — Patient Instructions (Addendum)
Levemir 30 units 2x daily until finished and then switch to [place] once a day starting with 60 units and adjust as discussed  NOVOLOG: Take described before eating starting with 6 units before breakfast and supper and 10 units before lunch and adjust as discussed  Stop Bydureon and Janumet  Take Invokana 1 pill in the morning  Blood sugar targets under 130 before meals and at least under 180, 2 hours or so after meals

## 2017-10-02 ENCOUNTER — Other Ambulatory Visit: Payer: Self-pay

## 2017-10-02 MED ORDER — INSULIN LISPRO 100 UNIT/ML (KWIKPEN): 10.0000 [IU] | PEN_INJECTOR | Freq: Three times a day (TID) | SUBCUTANEOUS | 3 refills | Status: DC

## 2017-10-05 ENCOUNTER — Telehealth: Payer: Self-pay

## 2017-10-05 NOTE — Telephone Encounter (Signed)
PA submitted for Novolog FlexPen 100unit/ml pen injectors.

## 2017-10-13 ENCOUNTER — Encounter (HOSPITAL_COMMUNITY): Payer: Self-pay | Admitting: Emergency Medicine

## 2017-10-13 ENCOUNTER — Ambulatory Visit (HOSPITAL_COMMUNITY)
Admission: EM | Admit: 2017-10-13 | Discharge: 2017-10-13 | Disposition: A | Discharge status: Home / Self Care | Payer: 59 | Attending: Nurse Practitioner | Admitting: Nurse Practitioner

## 2017-10-13 DIAGNOSIS — B029 Zoster without complications: Secondary | ICD-10-CM

## 2017-10-13 MED ORDER — VALACYCLOVIR HCL 1 G PO TABS: 1000.0000 mg | ORAL_TABLET | Freq: Three times a day (TID) | ORAL | 0 refills | Status: AC

## 2017-10-13 NOTE — ED Triage Notes (Signed)
Pt sts itchy rash to chest x 2 days

## 2017-10-13 NOTE — ED Provider Notes (Signed)
[facility]    CSN: [id number] Arrival date & time: 10/13/17  1122     History   Chief Complaint Chief Complaint  Patient presents with  . Rash    HPI Brett Walsh is a 52 y.o. male.   With history of HIV, T2DM and Deviated Septum, comes in today for rash on chest onset 2 days ago. Rash describes as several bumps on the center chest. Reports all the bumps came on at the same time. Rash is itchy and only painful to the touch. Denies new skin care or hygiene product changes. Had a med changes 2 weeks ago where PCP switched him to [place] and Humalog. Denies any systemic symptoms currently or prior to onset of rash, denies nausea, vomiting, fever, headache, dizziness, fatigue.      Past Medical History:  Diagnosis Date  . Deviated septum   . Diabetes mellitus without complication ([place])   . HIV disease [facility])     Patient Active Problem List   Diagnosis Date Noted  . Contusion of face 02/06/2017  . GERD (gastroesophageal reflux disease) 12/02/2016  . Medication reaction 10/14/2016  . Depression (emotion) 07/23/2016  . Insomnia 07/23/2016  . Routine general medical examination at a health care facility 06/20/2016  . TMJ (temporomandibular joint syndrome) 01/21/2016  . Slow transit constipation 08/16/2014  . Thrush 07/10/2014  . HIV disease ([place]) 06/20/2014  . Diabetes type 2, uncontrolled ([place]) 06/20/2014    Past Surgical History:  Procedure Laterality Date  . [place], [id number]       Home Medications    Prior to Admission medications   Medication Sig Start Date End Date Taking? Authorizing Provider  Blood Glucose Monitoring Suppl (ONE TOUCH ULTRA MINI) w/Device KIT Use meter to check blood sugars 1-4 times daily as instructed. 10/14/16   Golden Circle, FNP  canagliflozin (INVOKANA) 100 MG TABS tablet 1 tablet before breakfast 10/01/17   Elayne Snare, MD  chlorhexidine (PERIDEX) 0.12 % solution Use as directed 15 mLs in  the mouth or throat 2 (two) times daily.    [provider]  Continuous Blood Gluc Sensor (FREESTYLE LIBRE 14 DAY SENSOR) MISC 1 Units by Does not apply route every 14 (fourteen) days. 10/01/17   Elayne Snare, MD  glucose blood (ONE TOUCH ULTRA TEST) test strip Use one strip per test. Test blood sugars 1-4 times daily as instructed. 10/01/17   Elayne Snare, MD  insulin aspart (NOVOLOG FLEXPEN) 100 UNIT/ML FlexPen 10 UNITS AC TID 10/01/17   Elayne Snare, MD  Insulin Degludec (TRESIBA FLEXTOUCH) 200 UNIT/ML SOPN Inject 60 Units into the skin at bedtime. 10/01/17   Elayne Snare, MD  Insulin Detemir (LEVEMIR FLEXTOUCH) 100 UNIT/ML Pen Inject 5 Units into the skin daily at 10 pm. Taper up as directed 09/23/17   Lance Sell, NP  insulin lispro (HUMALOG KWIKPEN) 100 UNIT/ML KiwkPen Inject 0.1 mLs (10 Units total) into the skin 3 (three) times daily. 10/02/17   Elayne Snare, MD  Insulin Pen Needle (PEN NEEDLES) 31G X 8 MM MISC Use to inject insulin as needed. 06/12/17   Lance Sell, NP  Lancets Misc. (ONE TOUCH SURESOFT) MISC Use 1 lancet per test. Test blood sugars 1-4 times per day as instructed. 10/14/16   Golden Circle, FNP  loratadine (CLARITIN) 10 MG tablet Take 10 mg by mouth daily as needed for allergies.    [provider]  TRIUMEQ 600-50-300 MG tablet TAKE 1 TABLET BY MOUTH  DAILY 09/22/17   Campbell Riches, MD  valACYclovir (VALTREX) 1000 MG tablet Take 1 tablet (1,000 mg total) by mouth 3 (three) times daily for 14 days. 10/13/17 10/27/17  Barry Dienes, NP  varenicline ([place] PAK) 0.5 MG X 11 & 1 MG X 42 tablet Take as directed 07/24/17   Marrian Salvage, FNP    Family History Family History  Problem Relation Age of Onset  . Diabetes Brother   . Diabetes Mother   . Diabetes Father   . Heart failure Father   . Diabetes Sister     Social History Social History   Tobacco Use  . Smoking status: Light Tobacco Smoker    Packs/day: 0.50    Years:  36.00    Pack years: 18.00    Types: Cigarettes, E-cigarettes  . Smokeless tobacco: Current User  Substance Use Topics  . Alcohol use: Yes    Alcohol/week: 0.6 oz    Types: 1 Standard drinks or equivalent per week    Comment: socially  . Drug use: No    Types: Hydromorphone    Allergies   Metformin and related and Protonix [pantoprazole sodium]   Review of Systems Review of Systems  Constitutional:       See HPI     Physical Exam Triage Vital Signs ED Triage Vitals [10/13/17 1151]  Enc Vitals Group     BP (!) 138/91     Pulse Rate 100     Resp 18     Temp 97.8 F (36.6 C)     Temp Source Oral     SpO2 100 %     Weight      Height      Head Circumference      Peak Flow      Pain Score      Pain Loc      Pain Edu?      Excl. in [place]?    No data found.  Updated Vital Signs BP (!) 138/91 (BP Location: Left Arm)   Pulse 100   Temp 97.8 F (36.6 C) (Oral)   Resp 18   SpO2 100%   Physical Exam  Constitutional: He appears well-developed and well-nourished.  Cardiovascular: Normal rate.  Pulmonary/Chest: Effort normal.  Skin:  See picture below  Nursing note and vitals reviewed.     UC Treatments / Results  Labs (all labs ordered are listed, but only abnormal results are displayed) Labs Reviewed - No data to display  EKG None  Radiology No results found.  Procedures Procedures (including critical care time)  Medications Ordered in UC Medications - No data to display  Initial Impression / Assessment and Plan / UC Course  I have reviewed the triage vital signs and the nursing notes.  Pertinent labs & imaging results that were available during my care of the patient were reviewed by me and considered in my medical decision making (see chart for details).  Final Clinical Impressions(s) / UC Diagnoses   Final diagnoses:  Herpes zoster without complication   Rash consistent with shingle. Prescriptions given. Reviewed directions for usage and  side effects. Patient states understanding and will call with questions or problems. Patient instructed to call or follow up with his/her primary care doctor if failure to improve or change in symptoms. Discharge instruction given.   Discharge Instructions   None    ED Prescriptions    Medication Sig Dispense Auth. Provider   valACYclovir (VALTREX) 1000 MG tablet  Take 1 tablet (1,000 mg total) by mouth 3 (three) times daily for 14 days. 21 tablet Barry Dienes, NP     Controlled Substance Prescriptions [place] Controlled Substance Registry consulted? Not Applicable   Barry Dienes, NP 10/13/17 1238

## 2017-10-21 ENCOUNTER — Encounter: Payer: 59 | Admitting: Nutrition

## 2017-10-30 DIAGNOSIS — E119 Type 2 diabetes mellitus without complications: Secondary | ICD-10-CM | POA: Diagnosis not present

## 2017-10-30 DIAGNOSIS — K529 Noninfective gastroenteritis and colitis, unspecified: Secondary | ICD-10-CM | POA: Diagnosis not present

## 2017-10-30 DIAGNOSIS — M545 Low back pain: Secondary | ICD-10-CM | POA: Diagnosis not present

## 2017-11-12 ENCOUNTER — Encounter: Payer: Self-pay | Admitting: Nurse Practitioner

## 2017-11-12 ENCOUNTER — Encounter: Payer: Self-pay | Admitting: Family Medicine

## 2017-11-12 ENCOUNTER — Ambulatory Visit (INDEPENDENT_AMBULATORY_CARE_PROVIDER_SITE_OTHER): Payer: 59 | Admitting: Family Medicine

## 2017-11-12 VITALS — BP 142/86 | HR 82 | Temp 97.9°F | Ht 66.0 in | Wt 240.0 lb

## 2017-11-12 DIAGNOSIS — E11649 Type 2 diabetes mellitus with hypoglycemia without coma: Secondary | ICD-10-CM | POA: Diagnosis not present

## 2017-11-12 DIAGNOSIS — G44209 Tension-type headache, unspecified, not intractable: Secondary | ICD-10-CM | POA: Diagnosis not present

## 2017-11-12 LAB — GLUCOSE, POCT (MANUAL RESULT ENTRY): POC Glucose: 160 mg/dl — AB (ref 70–99)

## 2017-11-12 MED ORDER — MELOXICAM 15 MG PO TABS: 15.0000 mg | ORAL_TABLET | Freq: Every day | ORAL | 0 refills | Status: DC

## 2017-11-12 MED ORDER — DICLOFENAC SODIUM 2 % TD SOLN: 1.0000 "application " | Freq: Two times a day (BID) | TRANSDERMAL | 3 refills | Status: DC

## 2017-11-12 NOTE — Progress Notes (Signed)
Brett Walsh - 52 y.o. male MRN [id number]  Date of birth: 03-Aug-1965  SUBJECTIVE:  Including CC & ROS.  Chief Complaint  Patient presents with  . Headache    Brett Walsh is a 52 y.o. male that is presenting with a headache. Has been intermittent for two weeks, has been constant for two days. Admits to nausea. Admits to dizziness and loss of balance.  Denies fevers. Admits to being more fatigue.  He reports that the pain is in the back of his head.  He works at a computer most days. No trauma to his head.   He discontinue Humalog a two weeks ago, due to low blood sugar.   He received an injection on 11/10/17 for his symptoms at his works health center. He does not remember what the injections.   Review of his blood work from 4/29 shows normal electrolytes and a hyperglycemia level of 330.  She has normal creatinine.  Review of his A1c from 1/11 shows 13.8.   Review of Systems  Constitutional: Negative for fever.  HENT: Negative for congestion.   Respiratory: Negative for cough.   Cardiovascular: Negative for chest pain.  Gastrointestinal: Negative for abdominal pain.  Musculoskeletal: Positive for neck pain.  Skin: Negative for color change.  Neurological: Positive for headaches.  Hematological: Negative for adenopathy.  Psychiatric/Behavioral: Negative for agitation.    HISTORY: Past Medical, Surgical, Social, and Family History Reviewed & Updated per EMR.   Pertinent Historical Findings include:  Past Medical History:  Diagnosis Date  . Deviated septum   . Diabetes mellitus without complication (HCC)   . HIV disease [facility])     Past Surgical History:  Procedure Laterality Date  . NASAL SEPTUM SURGERY  1981, [id number]    Allergies  Allergen Reactions  . Metformin And Related     GI upset  . Protonix [Pantoprazole Sodium] Hives    Family History  Problem Relation Age of Onset  . Diabetes Brother   . Diabetes Mother   . Diabetes Father   . Heart failure  Father   . Diabetes Sister      Social History   Socioeconomic History  . Marital status: Single    Spouse name: Not on file  . Number of children: 0  . Years of education: 49  . Highest education level: Not on file  Occupational History  . Occupation: Risk analyst  Social Needs  . Financial resource strain: Not on file  . Food insecurity:    Worry: Not on file    Inability: Not on file  . Transportation needs:    Medical: Not on file    Non-medical: Not on file  Tobacco Use  . Smoking status: Light Tobacco Smoker    Packs/day: 0.50    Years: 36.00    Pack years: 18.00    Types: Cigarettes, E-cigarettes  . Smokeless tobacco: Current User  Substance and Sexual Activity  . Alcohol use: Yes    Alcohol/week: 0.6 oz    Types: 1 Standard drinks or equivalent per week    Comment: socially  . Drug use: No    Types: Hydromorphone  . Sexual activity: Never    Partners: Male    Comment: pt. declined condoms  Lifestyle  . Physical activity:    Days per week: Not on file    Minutes per session: Not on file  . Stress: Not on file  Relationships  . Social connections:    Talks on phone: Not on  file    Gets together: Not on file    Attends religious service: Not on file    Active member of club or organization: Not on file    Attends meetings of clubs or organizations: Not on file    Relationship status: Not on file  . Intimate partner violence:    Fear of current or ex partner: Not on file    Emotionally abused: Not on file    Physically abused: Not on file    Forced sexual activity: Not on file  Other Topics Concern  . Not on file  Social History Narrative   Fun: Watch TV, veg at home.      PHYSICAL EXAM:  VS: BP (!) 142/86 (BP Location: Left Arm, Patient Position: Sitting, Cuff Size: Normal)   Pulse 82   Temp 97.9 F (36.6 C) (Oral)   Ht 5\' 6"  (1.676 m)   Wt 240 lb (108.9 kg)   SpO2 98%   BMI 38.74 kg/m  Physical Exam Gen: NAD, alert,  cooperative with exam, well-appearing ENT: normal lips, normal nasal mucosa,  Eye: normal EOM, normal conjunctiva and lids CV:  no edema, +2 pedal pulses   Resp: no accessory muscle use, non-labored,  Skin: no rashes, no areas of induration  Neuro: normal tone, normal sensation to touch Psych:  normal insight, alert and oriented MSK:  Neck: Inspection unremarkable. No palpable stepoffs. Full neck range of motion Grip strength and sensation normal in bilateral hands Neurovascularly intact      ASSESSMENT & PLAN:   Acute non intractable tension-type headache Possible to be related to his diabetes. Blood sugar is 168. 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  - mobic  - pennsaid  - counseled on HEP  - if no improvement consider PT vs trigger point injection vs CT head

## 2017-11-12 NOTE — Patient Instructions (Signed)
Please try the exercises  Please try the mobic for 5 days and then as needed  Please follow up if your headache doesn't improve in two weeks or sooner if symptoms worsen.

## 2017-11-12 NOTE — Assessment & Plan Note (Signed)
Possible to be related to his diabetes. Blood sugar is 168. 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  - mobic  - pennsaid  - counseled on HEP  - if no improvement consider PT vs trigger point injection vs CT head

## 2017-11-23 ENCOUNTER — Other Ambulatory Visit: Payer: Self-pay | Admitting: Infectious Diseases

## 2017-11-23 DIAGNOSIS — B20 Human immunodeficiency virus [HIV] disease: Secondary | ICD-10-CM

## 2017-11-26 ENCOUNTER — Other Ambulatory Visit: Payer: Self-pay | Admitting: Endocrinology

## 2017-11-26 ENCOUNTER — Other Ambulatory Visit (INDEPENDENT_AMBULATORY_CARE_PROVIDER_SITE_OTHER): Payer: 59

## 2017-11-26 DIAGNOSIS — E1165 Type 2 diabetes mellitus with hyperglycemia: Secondary | ICD-10-CM | POA: Diagnosis not present

## 2017-11-26 DIAGNOSIS — Z794 Long term (current) use of insulin: Principal | ICD-10-CM

## 2017-11-26 LAB — HEMOGLOBIN A1C: HEMOGLOBIN A1C: 7.5 % — AB (ref 4.6–6.5)

## 2017-11-26 LAB — LIPID PANEL
CHOLESTEROL: 185 mg/dL (ref 0–200)
HDL: 47.6 mg/dL (ref >39.00)
LDL CALC: 125 mg/dL — AB (ref 0–99)
NonHDL: 137.43
TRIGLYCERIDES: 61 mg/dL (ref 0.0–149.0)
Total CHOL/HDL Ratio: 4
VLDL: 12.2 mg/dL (ref 0.0–40.0)

## 2017-11-26 LAB — GLUCOSE, RANDOM: Glucose, Bld: 100 mg/dL — ABNORMAL HIGH (ref 70–99)

## 2017-12-02 ENCOUNTER — Ambulatory Visit: Payer: 59 | Admitting: Endocrinology

## 2017-12-22 ENCOUNTER — Other Ambulatory Visit: Payer: Self-pay | Admitting: Endocrinology

## 2017-12-22 DIAGNOSIS — E1165 Type 2 diabetes mellitus with hyperglycemia: Secondary | ICD-10-CM

## 2017-12-22 DIAGNOSIS — Z794 Long term (current) use of insulin: Principal | ICD-10-CM

## 2017-12-22 DIAGNOSIS — E782 Mixed hyperlipidemia: Secondary | ICD-10-CM

## 2017-12-22 MED ORDER — ROSUVASTATIN CALCIUM 5 MG PO TABS: 5.0000 mg | ORAL_TABLET | Freq: Every day | ORAL | 3 refills | Status: DC

## 2017-12-29 ENCOUNTER — Other Ambulatory Visit: Payer: Self-pay | Admitting: Family Medicine

## 2018-01-14 ENCOUNTER — Other Ambulatory Visit: Payer: Self-pay

## 2018-01-14 DIAGNOSIS — E1169 Type 2 diabetes mellitus with other specified complication: Secondary | ICD-10-CM

## 2018-01-14 DIAGNOSIS — E669 Obesity, unspecified: Principal | ICD-10-CM

## 2018-01-14 MED ORDER — GLUCOSE BLOOD VI STRP: ORAL_STRIP | 12 refills | Status: DC

## 2018-01-21 ENCOUNTER — Ambulatory Visit: Payer: 59 | Admitting: Endocrinology

## 2018-01-22 ENCOUNTER — Other Ambulatory Visit: Payer: Self-pay | Admitting: Endocrinology

## 2018-01-27 ENCOUNTER — Other Ambulatory Visit: Payer: 59

## 2018-01-27 ENCOUNTER — Other Ambulatory Visit (HOSPITAL_COMMUNITY)
Admission: RE | Admit: 2018-01-27 | Discharge: 2018-01-27 | Disposition: A | Discharge status: Home / Self Care | Payer: 59 | Source: Ambulatory Visit | Attending: Infectious Diseases | Admitting: Infectious Diseases

## 2018-01-27 DIAGNOSIS — Z113 Encounter for screening for infections with a predominantly sexual mode of transmission: Secondary | ICD-10-CM | POA: Insufficient documentation

## 2018-01-27 DIAGNOSIS — B20 Human immunodeficiency virus [HIV] disease: Secondary | ICD-10-CM

## 2018-01-27 DIAGNOSIS — Z79899 Other long term (current) drug therapy: Secondary | ICD-10-CM | POA: Diagnosis not present

## 2018-01-28 LAB — T-HELPER CELL (CD4) - (RCID CLINIC ONLY)
CD4 % Helper T Cell: 8 % — ABNORMAL LOW (ref 33–55)
CD4 T CELL ABS: 360 /uL — AB (ref 400–2700)

## 2018-01-29 LAB — CBC
HEMATOCRIT: 40.9 % (ref 38.5–50.0)
HEMOGLOBIN: 14 g/dL (ref 13.2–17.1)
MCH: 31.5 pg (ref 27.0–33.0)
MCHC: 34.2 g/dL (ref 32.0–36.0)
MCV: 92.1 fL (ref 80.0–100.0)
MPV: 11 fL (ref 7.5–12.5)
PLATELETS: 304 10*3/uL (ref 140–400)
RBC: 4.44 10*6/uL (ref 4.20–5.80)
RDW: 12.7 % (ref 11.0–15.0)
WBC: 14.8 10*3/uL — ABNORMAL HIGH (ref 3.8–10.8)

## 2018-01-29 LAB — LIPID PANEL
Cholesterol: 140 mg/dL (ref <200)
HDL: 39 mg/dL — ABNORMAL LOW (ref >40)
LDL Cholesterol (Calc): 77 mg/dL (calc)
Non-HDL Cholesterol (Calc): 101 mg/dL (calc) (ref <130)
Total CHOL/HDL Ratio: 3.6 (calc) (ref <5.0)
Triglycerides: 143 mg/dL (ref <150)

## 2018-01-29 LAB — COMPREHENSIVE METABOLIC PANEL
AG Ratio: 1.2 (calc) (ref 1.0–2.5)
ALT: 21 U/L (ref 9–46)
AST: 18 U/L (ref 10–35)
Albumin: 4.1 g/dL (ref 3.6–5.1)
Alkaline phosphatase (APISO): 68 U/L (ref 40–115)
BUN: 21 mg/dL (ref 7–25)
CO2: 24 mmol/L (ref 20–32)
Calcium: 9.5 mg/dL (ref 8.6–10.3)
Chloride: 106 mmol/L (ref 98–110)
Creat: 0.95 mg/dL (ref 0.70–1.33)
Globulin: 3.5 g/dL (calc) (ref 1.9–3.7)
Glucose, Bld: 123 mg/dL — ABNORMAL HIGH (ref 65–99)
Potassium: 5.3 mmol/L (ref 3.5–5.3)
Sodium: 140 mmol/L (ref 135–146)
Total Bilirubin: 0.3 mg/dL (ref 0.2–1.2)
Total Protein: 7.6 g/dL (ref 6.1–8.1)

## 2018-01-29 LAB — HIV-1 RNA QUANT-NO REFLEX-BLD
HIV 1 RNA Quant: <20 copies/mL — AB
HIV-1 RNA Quant, Log: <1.3 Log copies/mL — AB

## 2018-01-29 LAB — URINE CYTOLOGY ANCILLARY ONLY
Chlamydia: NEGATIVE
Neisseria Gonorrhea: NEGATIVE

## 2018-01-29 LAB — FLUORESCENT TREPONEMAL AB(FTA)-IGG-BLD: Fluorescent Treponemal ABS: NONREACTIVE

## 2018-01-29 LAB — RPR TITER: RPR Titer: 1:1 {titer} — ABNORMAL HIGH

## 2018-01-29 LAB — RPR: RPR: REACTIVE — AB

## 2018-02-02 ENCOUNTER — Encounter: Payer: Self-pay | Admitting: Infectious Diseases

## 2018-02-02 ENCOUNTER — Ambulatory Visit: Payer: 59 | Admitting: Infectious Diseases

## 2018-02-02 VITALS — BP 134/86 | HR 89 | Temp 97.8°F | Wt 254.1 lb

## 2018-02-02 DIAGNOSIS — Z113 Encounter for screening for infections with a predominantly sexual mode of transmission: Secondary | ICD-10-CM | POA: Diagnosis not present

## 2018-02-02 DIAGNOSIS — E1169 Type 2 diabetes mellitus with other specified complication: Secondary | ICD-10-CM

## 2018-02-02 DIAGNOSIS — E11649 Type 2 diabetes mellitus with hypoglycemia without coma: Secondary | ICD-10-CM | POA: Diagnosis not present

## 2018-02-02 DIAGNOSIS — Z79899 Other long term (current) drug therapy: Secondary | ICD-10-CM

## 2018-02-02 DIAGNOSIS — E785 Hyperlipidemia, unspecified: Secondary | ICD-10-CM

## 2018-02-02 DIAGNOSIS — B20 Human immunodeficiency virus [HIV] disease: Secondary | ICD-10-CM

## 2018-02-02 DIAGNOSIS — Z23 Encounter for immunization: Secondary | ICD-10-CM

## 2018-02-02 NOTE — Progress Notes (Signed)
   Subjective:    Patient ID: Brett Walsh, male    DOB: [birth year], 52 y.o.   MRN: [id number]  HPI 52yo M with hx of HIV+ since 2008 (when he was hospitalized with pneumonia), on tivicay-descovy.  (was on atripla prior, developed resistance), also hx of DM2 (2012) on metformin.  He was seen in f/u in ID clinic on 1-19-16and started on triumeq. Was seen for VZV since last visit.  States his FSG have been undercontrol- last A1C 7.5%. Has implantable DM monitor. Now on invokana. Has gained wt- 40#. Trying not to eat at night, insulin wakes him up at night with shakes.     HIV 1 RNA Quant (copies/mL)  Date Value  01/27/2018 <20 DETECTED (A)  02/17/2017 <20 DETECTED (A)  07/09/2016 <20 NOT DETECTED   CD4 T Cell Abs (/uL)  Date Value  01/27/2018 360 (L)  02/17/2017 380 (L)  07/09/2016 270 (L)    Review of Systems  Constitutional: Positive for unexpected weight change. Negative for appetite change.  Eyes: Negative for visual disturbance.  Respiratory: Negative for cough and shortness of breath.   Gastrointestinal: Negative for constipation and diarrhea.  Genitourinary: Negative for difficulty urinating.  Neurological: Negative for numbness.  has seen ophtho this year. Saw retinal specialist, told to come back in 5 yrs.  Has not had colon yet.  Please see HPI. All other systems reviewed and negative.     Objective:   Physical Exam  Constitutional: He is oriented to person, place, and time. He appears well-developed and well-nourished.  HENT:  Mouth/Throat: No oropharyngeal exudate.  Eyes: Pupils are equal, round, and reactive to light. EOM are normal.  Neck: Normal range of motion. Neck supple.  Cardiovascular: Normal rate, regular rhythm and normal heart sounds.  Pulmonary/Chest: Effort normal and breath sounds normal.  Abdominal: Soft. Bowel sounds are normal. He exhibits no distension. There is no tenderness.  Musculoskeletal: Normal range of motion.  Lymphadenopathy:      He has no cervical adenopathy.  Neurological: He is alert and oriented to person, place, and time.  Psychiatric: He has a normal mood and affect.      Assessment & Plan:

## 2018-02-02 NOTE — Assessment & Plan Note (Signed)
Well controlled on statin Will recheck at f/u

## 2018-02-02 NOTE — Assessment & Plan Note (Signed)
Will refer him for colon PCV and flu today.  Offered/refused condoms.  Continue triumeq rtc in 9 months.

## 2018-02-02 NOTE — Assessment & Plan Note (Signed)
Doing well, appreciate IM/endo f/u.

## 2018-02-02 NOTE — Assessment & Plan Note (Signed)
Encouraged diet and exercise Will be difficult with his insulin.

## 2018-02-03 NOTE — Addendum Note (Signed)
Addended by: Gerarda Fraction on: 02/03/2018 12:26 PM   Modules accepted: Orders

## 2018-02-11 ENCOUNTER — Other Ambulatory Visit: Payer: Self-pay | Admitting: Endocrinology

## 2018-03-03 ENCOUNTER — Other Ambulatory Visit: Payer: Self-pay | Admitting: Endocrinology

## 2018-03-03 NOTE — Telephone Encounter (Signed)
Last seen 09/2017, no follow up made. Is this okay to refill?

## 2018-03-03 NOTE — Telephone Encounter (Signed)
Please do not refill, note to make appointment

## 2018-03-04 ENCOUNTER — Other Ambulatory Visit: Payer: Self-pay

## 2018-03-04 NOTE — Telephone Encounter (Signed)
Last appointment 10/01/17  Please advise if ok to fill

## 2018-03-04 NOTE — Telephone Encounter (Signed)
Needs to make appointment with labs for follow-up first, if no appointment available right away will need to see Dr. Lonzo Cloud

## 2018-03-09 ENCOUNTER — Other Ambulatory Visit: Payer: Self-pay | Admitting: Endocrinology

## 2018-03-11 ENCOUNTER — Other Ambulatory Visit (INDEPENDENT_AMBULATORY_CARE_PROVIDER_SITE_OTHER): Payer: 59

## 2018-03-11 ENCOUNTER — Ambulatory Visit: Payer: 59 | Admitting: Nurse Practitioner

## 2018-03-11 ENCOUNTER — Other Ambulatory Visit: Payer: Self-pay | Admitting: Endocrinology

## 2018-03-11 ENCOUNTER — Encounter: Payer: Self-pay | Admitting: Nurse Practitioner

## 2018-03-11 VITALS — BP 118/80 | HR 79 | Temp 98.4°F | Ht 66.0 in | Wt 249.0 lb

## 2018-03-11 DIAGNOSIS — R21 Rash and other nonspecific skin eruption: Secondary | ICD-10-CM

## 2018-03-11 DIAGNOSIS — Z1211 Encounter for screening for malignant neoplasm of colon: Secondary | ICD-10-CM

## 2018-03-11 LAB — CBC
HCT: 38.5 % — ABNORMAL LOW (ref 39.0–52.0)
HEMOGLOBIN: 13.1 g/dL (ref 13.0–17.0)
MCHC: 34.1 g/dL (ref 30.0–36.0)
MCV: 93.1 fl (ref 78.0–100.0)
Platelets: 290 10*3/uL (ref 150.0–400.0)
RBC: 4.13 Mil/uL — AB (ref 4.22–5.81)
RDW: 13.2 % (ref 11.5–15.5)
WBC: 13.9 10*3/uL — ABNORMAL HIGH (ref 4.0–10.5)

## 2018-03-11 LAB — COMPREHENSIVE METABOLIC PANEL
ALT: 21 U/L (ref 0–53)
AST: 14 U/L (ref 0–37)
Albumin: 4.2 g/dL (ref 3.5–5.2)
Alkaline Phosphatase: 62 U/L (ref 39–117)
BUN: 18 mg/dL (ref 6–23)
CO2: 27 meq/L (ref 19–32)
Calcium: 9.3 mg/dL (ref 8.4–10.5)
Chloride: 104 mEq/L (ref 96–112)
Creatinine, Ser: 0.89 mg/dL (ref 0.40–1.50)
GFR: 96.08 mL/min (ref >60.00)
GLUCOSE: 133 mg/dL — AB (ref 70–99)
POTASSIUM: 3.9 meq/L (ref 3.5–5.1)
Sodium: 139 mEq/L (ref 135–145)
Total Bilirubin: 0.3 mg/dL (ref 0.2–1.2)
Total Protein: 8.1 g/dL (ref 6.0–8.3)

## 2018-03-11 MED ORDER — METHYLPREDNISOLONE 4 MG PO TBPK: ORAL_TABLET | ORAL | 0 refills | Status: DC

## 2018-03-11 NOTE — Progress Notes (Signed)
Brett Walsh is a 52 y.o. male with the following history as recorded in [employer]:  Patient Active Problem List   Diagnosis Date Noted  . Morbid obesity ([place]) 02/02/2018  . Hyperlipidemia associated with type 2 diabetes mellitus ([place]) 02/02/2018  . Acute non intractable tension-type headache 11/12/2017  . Contusion of face 02/06/2017  . GERD (gastroesophageal reflux disease) 12/02/2016  . Medication reaction 10/14/2016  . Depression (emotion) 07/23/2016  . Insomnia 07/23/2016  . Routine general medical examination at a health care facility 06/20/2016  . TMJ (temporomandibular joint syndrome) 01/21/2016  . Slow transit constipation 08/16/2014  . Thrush 07/10/2014  . HIV disease ([place]) 06/20/2014  . Diabetes type 2, uncontrolled ([place]) 06/20/2014    Current Outpatient Medications  Medication Sig Dispense Refill  . Blood Glucose Monitoring Suppl (ONE TOUCH ULTRA MINI) w/Device KIT Use meter to check blood sugars 1-4 times daily as instructed. 1 each 0  . chlorhexidine (PERIDEX) 0.12 % solution Use as directed 15 mLs in the mouth or throat 2 (two) times daily.    . Continuous Blood Gluc Sensor (FREESTYLE LIBRE 14 DAY SENSOR) MISC USE 1 SENSOR EVERY 14 DAYS 2 each 0  . glucose blood (ONE TOUCH ULTRA TEST) test strip Use one strip per test. Test blood sugars 1-4 times daily as instructed. 100 each 12  . HUMALOG KWIKPEN 100 UNIT/ML KiwkPen INJECT 10 UNITS UNDER THE SKIN THREE TIMES DAILY 9 mL 0  . Insulin Pen Needle (PEN NEEDLES) 31G X 8 MM MISC Use to inject insulin as needed. 100 each 5  . INVOKANA 100 MG TABS tablet TAKE 1 TABLET BY MOUTH BEFORE BREAKFAST 30 tablet 0  . Lancets Misc. (ONE TOUCH SURESOFT) MISC Use 1 lancet per test. Test blood sugars 1-4 times per day as instructed. 1 each 1  . loratadine (CLARITIN) 10 MG tablet Take 10 mg by mouth daily as needed for allergies.    . rosuvastatin (CRESTOR) 5 MG tablet Take 1 tablet (5 mg total) by mouth daily. 90 tablet 3  . TRESIBA  FLEXTOUCH 200 UNIT/ML SOPN INJECT 60 UNITS INTO THE SKIN EVERY NIGHT AT BEDTIME 3 pen 3  . TRIUMEQ 600-50-300 MG tablet TAKE 1 TABLET BY MOUTH DAILY 90 tablet 0  . methylPREDNISolone (MEDROL) 4 MG TBPK tablet 6 tabs today, 5 tabs day 2, 4 tabs day 3, 3 tabs day 4, 2 tabs day 5, 1 tab day 6. Take w/food. 21 tablet 0   No current facility-administered medications for this visit.     Allergies: Metformin and related and Protonix [pantoprazole sodium]  Past Medical History:  Diagnosis Date  . Deviated septum   . Diabetes mellitus without complication ([place])   . HIV disease [facility])     Past Surgical History:  Procedure Laterality Date  . NASAL SEPTUM SURGERY  1981, [id number]    Family History  Problem Relation Age of Onset  . Diabetes Brother   . Diabetes Mother   . Diabetes Father   . Heart failure Father   . Diabetes Sister     Social History   Tobacco Use  . Smoking status: Light Tobacco Smoker    Packs/day: 0.50    Years: 36.00    Pack years: 18.00    Types: Cigarettes, E-cigarettes  . Smokeless tobacco: Former Network engineer Use Topics  . Alcohol use: Yes    Alcohol/week: 1.0 standard drinks    Types: 1 Standard drinks or equivalent per week    Comment: socially  Subjective:  Mr Brett Walsh is here today for evaluation of acute complaint of itchy rash, which first began about 1 week ago, looked like hives, present on his chest and back at first but then spreading to legs and head by yesterday. However he woke today, the rash was mostly gone, still with light rash to hands and chest only, but he does c/o continued itching to his entire body He experienced a similar rash when he had a medication allergy in the past, but has not started any new medications recently. No new body products, foods, no pets in house, no one else in house with rash He denies fevers, chills, oral swelling, trouble swallowing or breathing.   ROS: See HPI  Objective:  Vitals:   03/11/18 1300   BP: 118/80  Pulse: 79  Temp: 98.4 F (36.9 C)  TempSrc: Oral  SpO2: 98%  Weight: 249 lb (112.9 kg)  Height: 5' 6"  (1.676 m)    General: Well developed, well nourished, in no acute distress  Skin : Warm and dry. Light maculopapular rash to bilateral hands,  Head: Normocephalic and atraumatic  Eyes: Sclera and conjunctiva clear; pupils round and reactive to light; extraocular movements intact  Ears: External normal Oropharynx: Pink, supple. No suspicious lesions  Neck: Supple without thyromegaly, adenopathy  Lungs: Respirations unlabored; clear to auscultation bilaterally without wheeze, rales, rhonchi  CVS exam: normal rate, regular rhythm, normal S1, S2 Neurologic: Alert and oriented; speech intact; face symmetrical; moves all extremities well; CNII-XII intact without focal deficit    Assessment:  1. Rash   2. Screening for colon cancer      Plan:   Return in about 3 months (around 06/11/2018) for CPE.  Orders Placed This Encounter  Procedures  . CBC    Standing Status:   Future    Standing Expiration Date:   03/12/2019  . Comprehensive metabolic panel    Standing Status:   Future    Standing Expiration Date:   03/12/2019  . Ambulatory referral to Gastroenterology    Referral Priority:   Routine    Referral Type:   Consultation    Referral Reason:   Specialty Services Required    Number of Visits Requested:   1    Requested Prescriptions   Signed Prescriptions Disp Refills  . methylPREDNISolone (MEDROL) 4 MG TBPK tablet 21 tablet 0    Sig: 6 tabs today, 5 tabs day 2, 4 tabs day 3, 3 tabs day 4, 2 tabs day 5, 1 tab day 6. Take w/food.    Reviewed HM: Screening for colon cancer- Ambulatory referral to Gastroenterology  Rash Check labs Will treat with steroid course  Could be related to HIV meds- instructed to schedule F/U with ID provider to discuss Home management, red flags and return precautions including when to seek immediate care discussed and printed on  AVS - CBC; Future - Comprehensive metabolic panel; Future - methylPREDNISolone (MEDROL) 4 MG TBPK tablet; 6 tabs today, 5 tabs day 2, 4 tabs day 3, 3 tabs day 4, 2 tabs day 5, 1 tab day 6. Take w/food.  Dispense: 21 tablet; Refill: 0

## 2018-03-11 NOTE — Patient Instructions (Addendum)
Labs downstairs today  Please take medrol pack as prescribed  Please schedule follow up with Dr Ninetta Lights to discuss rash   Rash A rash is a change in the color of the skin. A rash can also change the way your skin feels. There are many different conditions and factors that can cause a rash. Follow these instructions at home: Pay attention to any changes in your symptoms. Follow these instructions to help with your condition: Medicine Take or apply over-the-counter and prescription medicines only as told by your doctor. These may include:  Corticosteroid cream.  Anti-itch lotions.  Oral antihistamines.  Skin Care  Put cool compresses on the affected areas.  Try taking a bath with: ? Epsom salts. Follow the instructions on the packaging. You can get these at your local pharmacy or grocery store. ? Baking soda. Pour a small amount into the bath as told by your doctor. ? Colloidal oatmeal. Follow the instructions on the packaging. You can get this at your local pharmacy or grocery store.  Try putting baking soda paste onto your skin. Stir water into baking soda until it gets like a paste.  Do not scratch or rub your skin.  Avoid covering the rash. Make sure the rash is exposed to air as much as possible. General instructions  Avoid hot showers or baths, which can make itching worse. A cold shower may help.  Avoid scented soaps, detergents, and perfumes. Use gentle soaps, detergents, perfumes, and other cosmetic products.  Avoid anything that causes your rash. Keep a journal to help track what causes your rash. Write down: ? What you eat. ? What cosmetic products you use. ? What you drink. ? What you wear. This includes jewelry.  Keep all follow-up visits as told by your doctor. This is important. Contact a doctor if:  You sweat at night.  You lose weight.  You pee (urinate) more than normal.  You feel weak.  You throw up (vomit).  Your skin or the whites of your  eyes look yellow (jaundice).  Your skin: ? Tingles. ? Is numb.  Your rash: ? Does not go away after a few days. ? Gets worse.  You are: ? More thirsty than normal. ? More tired than normal.  You have: ? New symptoms. ? Pain in your belly (abdomen). ? A fever. ? Watery poop (diarrhea). Get help right away if:  Your rash covers all or most of your body. The rash may or may not be painful.  You have blisters that: ? Are on top of the rash. ? Grow larger. ? Grow together. ? Are painful. ? Are inside your nose or mouth.  You have a rash that: ? Looks like purple pinprick-sized spots all over your body. ? Has a "bull's eye" or looks like a target. ? Is red and painful, causes your skin to peel, and is not from being in the sun too long. This information is not intended to replace advice given to you by your health care provider. Make sure you discuss any questions you have with your health care provider. Document Released: 11/05/2007 Document Revised: 10/25/2015 Document Reviewed: 10/04/2014 Elsevier Interactive Patient Education  2018 [employer].

## 2018-03-15 ENCOUNTER — Encounter: Payer: Self-pay | Admitting: Nurse Practitioner

## 2018-03-16 ENCOUNTER — Encounter: Payer: Self-pay | Admitting: Endocrinology

## 2018-03-16 ENCOUNTER — Ambulatory Visit: Payer: 59 | Admitting: Endocrinology

## 2018-03-16 VITALS — BP 132/88 | HR 95 | Ht 66.0 in | Wt 245.0 lb

## 2018-03-16 DIAGNOSIS — E669 Obesity, unspecified: Secondary | ICD-10-CM | POA: Diagnosis not present

## 2018-03-16 DIAGNOSIS — E1165 Type 2 diabetes mellitus with hyperglycemia: Secondary | ICD-10-CM | POA: Diagnosis not present

## 2018-03-16 DIAGNOSIS — Z794 Long term (current) use of insulin: Secondary | ICD-10-CM

## 2018-03-16 DIAGNOSIS — E1169 Type 2 diabetes mellitus with other specified complication: Secondary | ICD-10-CM | POA: Diagnosis not present

## 2018-03-16 LAB — POCT GLYCOSYLATED HEMOGLOBIN (HGB A1C): Hemoglobin A1C: 7.3 % — AB (ref 4.0–5.6)

## 2018-03-16 MED ORDER — INSULIN LISPRO 100 UNIT/ML (KWIKPEN): PEN_INJECTOR | SUBCUTANEOUS | 0 refills | Status: DC

## 2018-03-16 MED ORDER — CANAGLIFLOZIN 300 MG PO TABS: 300.0000 mg | ORAL_TABLET | Freq: Every day | ORAL | 3 refills | Status: DC

## 2018-03-16 NOTE — Patient Instructions (Addendum)
Dinner 8 units Humalog  Add 4-6 units extra when on steroids

## 2018-03-16 NOTE — Progress Notes (Signed)
Patient ID: Brett Walsh, male   DOB: 1965/08/31, 52 y.o.   MRN: [id number]           Reason for Appointment: Follow-up for Type 2 Diabetes   History of Present Illness:          Date of diagnosis of type 2 diabetes mellitus: 2012       Background history:   At the time of diagnosis he weighed about 268 pounds His blood sugars were markedly increased at that time when he was hospitalized for treatment and sent home on insulin, metformin and Avandia He says that however he was able to lose weight with improved diet and was able to get off medications and insulin  He has been on various treatment since a couple of years later but detailed records are not available, moved from [place] His A1c has been as high as 14.5 in 2017 when he was prescribed Basaglar and Humalog although he does not remember this Subsequently his blood sugar control has been quite variable He was first started on metformin in 06/2016 and the dose was progressively increased  Recent history:   INSULIN regimen is: 60 units TRESIBA daily.  Humalog 8 breakfast, 10 lunch and 6 at dinnertime   Non-insulin hypoglycemic drugs the patient is taking are: Invokana 100 mg daily  Most recent A1c is 7.3  Current history of diabetes, recent management, blood sugar patterns and problems identified:  His A1c has been as high as 13.8 this year  With starting mealtime insulin and increasing his basal insulin as well as switching to [place] he has had improving control  He was also started on Invokana on his last visit 5 months ago but he has not come back for follow-up until today  Prior to his last visit his blood sugars were averaging nearly 300 and showing considerable variability  However in the last couple months he appears to be gaining weight  Currently monitoring blood sugars with his freestyle Libre sensor and doing this fairly regularly  He says that when he started having weight gain he did start trying  to walk a little even though he has discomfort on walking regularly  Also he is cutting back on carbohydrates and fried food  Recently because of a rash he has been on Medrol Dosepak starting about 5 days ago and his blood sugars have gone up significantly especially in the evenings, highest reading 311 last night  Also he ran out of Invokana about a week ago  Blood sugar patterns indicate that his glucose is relatively better on Fasting and HIGHEST blood sugars late in the evenings after supper His largest meal is at lunch       Side effects from medications have been: Diarrhea from high-dose metformin  Compliance with the medical regimen: Improving  Hypoglycemia: None   Glucose monitoring:  done several times a day         Glucometer:  One Touch ultra and recently freestyle libre  Blood Glucose readings by download  AVERAGE for the last 2 weeks on the sensor 163 with 37% of readings above 180 and 63% in target  PRE-MEAL Fasting Lunch Dinner Bedtime Overall  Glucose range:       Mean/median:  130  165  155  163   POST-MEAL PC Breakfast PC Lunch PC Dinner  Glucose range:     Mean/median:  144  174  205   Previous readings:   PREMEAL Breakfast Lunch Dinner Bedtime  Overall  Glucose range:  14 0-308      Median:  250   410   295+/-96   POST-MEAL PC Breakfast PC Lunch PC Dinner  Glucose range:    280-498  Median:      Self-care: The diet that the patient has been following is: tries to limit sugar, may occasionally have fried food.      Usually eating breakfast at 5 AM and dinner at 7-8 PM                Dietician visit, most recent: Never               Exercise:  Trying to walk a little more otherwise has sedentary job  Weight history:  Wt Readings from Last 3 Encounters:  03/16/18 245 lb (111.1 kg)  03/11/18 249 lb (112.9 kg)  02/02/18 254 lb 1.9 oz (115.3 kg)    Glycemic control:   Lab Results  Component Value Date   HGBA1C 7.3 (A) 03/16/2018   HGBA1C  7.5 (H) 11/26/2017   HGBA1C 13.8 (H) 06/12/2017   Lab Results  Component Value Date   MICROALBUR 1.5 09/28/2017   LDLCALC 77 01/27/2018   CREATININE 0.89 03/11/2018   Lab Results  Component Value Date   MICRALBCREAT 1.4 09/28/2017    Lab Results  Component Value Date   FRUCTOSAMINE 436 (H) 09/28/2017      Allergies as of 03/16/2018      Reactions   Metformin And Related    GI upset   Protonix [pantoprazole Sodium] Hives      Medication List        Accurate as of 03/16/18  8:17 PM. Always use your most recent med list.          canagliflozin 300 MG Tabs tablet Commonly known as:  INVOKANA Take 1 tablet (300 mg total) by mouth daily before breakfast.   chlorhexidine 0.12 % solution Commonly known as:  PERIDEX Use as directed 15 mLs in the mouth or throat 2 (two) times daily.   FREESTYLE LIBRE 14 DAY SENSOR Misc USE 1 SENSOR EVERY 14 DAYS   glucose blood test strip Use one strip per test. Test blood sugars 1-4 times daily as instructed.   insulin lispro 100 UNIT/ML KiwkPen Commonly known as:  HUMALOG INJECT 10 UNITS UNDER THE SKIN THREE TIMES DAILY   loratadine 10 MG tablet Commonly known as:  CLARITIN Take 10 mg by mouth daily as needed for allergies.   methylPREDNISolone 4 MG Tbpk tablet Commonly known as:  MEDROL DOSEPAK 6 tabs today, 5 tabs day 2, 4 tabs day 3, 3 tabs day 4, 2 tabs day 5, 1 tab day 6. Take w/food.   ONE TOUCH SURESOFT Misc Use 1 lancet per test. Test blood sugars 1-4 times per day as instructed.   ONE TOUCH ULTRA MINI w/Device Kit Use meter to check blood sugars 1-4 times daily as instructed.   Pen Needles 31G X 8 MM Misc Use to inject insulin as needed.   rosuvastatin 5 MG tablet Commonly known as:  CRESTOR Take 1 tablet (5 mg total) by mouth daily.   TRESIBA FLEXTOUCH 200 UNIT/ML Sopn Generic drug:  Insulin Degludec INJECT 60 UNITS INTO THE SKIN EVERY NIGHT AT BEDTIME   TRIUMEQ 600-50-300 MG tablet Generic drug:   abacavir-dolutegravir-lamiVUDine TAKE 1 TABLET BY MOUTH DAILY       Allergies:  Allergies  Allergen Reactions  . Metformin And Related     GI upset  .  Protonix [Pantoprazole Sodium] Hives    Past Medical History:  Diagnosis Date  . Deviated septum   . Diabetes mellitus without complication ([place])   . HIV disease [facility])     Past Surgical History:  Procedure Laterality Date  . NASAL SEPTUM SURGERY  1981, [id number]    Family History  Problem Relation Age of Onset  . Diabetes Brother   . Diabetes Mother   . Diabetes Father   . Heart failure Father   . Diabetes Sister     Social History:  reports that he has been smoking cigarettes and e-cigarettes. He has a 18.00 pack-year smoking history. He has quit using smokeless tobacco. He reports that he drinks about 1.0 standard drinks of alcohol per week. He reports that he does not use drugs.   Review of Systems  Lipid history: Has never been on statin drugs    Lab Results  Component Value Date   CHOL 140 01/27/2018   HDL 39 (L) 01/27/2018   LDLCALC 77 01/27/2018   TRIG 143 01/27/2018   CHOLHDL 3.6 01/27/2018           Hypertension: Not treated for this but his blood pressure is variable  BP Readings from Last 3 Encounters:  03/16/18 132/88  03/11/18 118/80  02/02/18 134/86    Most recent eye exam was in 10/18  Most recent foot exam: 08/2017    LABS:  Office Visit on 03/16/2018  Component Date Value Ref Range Status  . Hemoglobin A1C 03/16/2018 7.3* 4.0 - 5.6 % Final  Appointment on 03/11/2018  Component Date Value Ref Range Status  . Sodium 03/11/2018 139  135 - 145 mEq/L Final  . Potassium 03/11/2018 3.9  3.5 - 5.1 mEq/L Final  . Chloride 03/11/2018 104  96 - 112 mEq/L Final  . CO2 03/11/2018 27  19 - 32 mEq/L Final  . Glucose, Bld 03/11/2018 133* 70 - 99 mg/dL Final  . BUN 03/11/2018 18  6 - 23 mg/dL Final  . Creatinine, Ser 03/11/2018 0.89  0.40 - 1.50 mg/dL Final  . Total Bilirubin 03/11/2018 0.3   0.2 - 1.2 mg/dL Final  . Alkaline Phosphatase 03/11/2018 62  39 - 117 U/L Final  . AST 03/11/2018 14  0 - 37 U/L Final  . ALT 03/11/2018 21  0 - 53 U/L Final  . Total Protein 03/11/2018 8.1  6.0 - 8.3 g/dL Final  . Albumin 03/11/2018 4.2  3.5 - 5.2 g/dL Final  . Calcium 03/11/2018 9.3  8.4 - 10.5 mg/dL Final  . GFR 03/11/2018 96.08  >60.00 mL/min Final  . WBC 03/11/2018 13.9* 4.0 - 10.5 K/uL Final  . RBC 03/11/2018 4.13* 4.22 - 5.81 Mil/uL Final  . Platelets 03/11/2018 290.0  150.0 - 400.0 K/uL Final  . Hemoglobin 03/11/2018 13.1  13.0 - 17.0 g/dL Final  . HCT 03/11/2018 38.5* 39.0 - 52.0 % Final  . MCV 03/11/2018 93.1  78.0 - 100.0 fl Final  . MCHC 03/11/2018 34.1  30.0 - 36.0 g/dL Final  . RDW 03/11/2018 13.2  11.5 - 15.5 % Final    Physical Examination:  BP 132/88   Pulse 95   Ht 5' 6"  (1.676 m)   Wt 245 lb (111.1 kg)   SpO2 97%   BMI 39.54 kg/m          ASSESSMENT:  Diabetes type 2, with recent BMI about 39  See history of present illness for detailed discussion of current diabetes management, blood sugar patterns and  problems identified  He is insulin-dependent  His blood sugars have been improved over the last few months with using basal bolus insulin regimen and Invokana Also although he had initially lost weight has gained some of it back in the last couple of months  Blood sugars have been pretty good except for high readings after some of his meals in the morning or evening with current dose of Humalog which is relatively small compared to the basal insulin His freestyle [name] is usually accurate  Reviewed his day-to-day management, lifestyle, blood sugar patterns, insulin regimen and causes of hyperglycemia including recent use of steroids He is likely benefiting from his Invokana with reduced weight gain and better insulin sensitivity Currently not on metformin but not clear if this has benefited him previously  PLAN:    Trial of 300 mg INVOKANA instead  of 100 for more effective blood sugar control, better weight loss, more consistent blood pressure readings and less tendency to postprandial hyperglycemia  We will increase suppertime Humalog by at least 2 units  In the next few days if he still has high readings with his steroids he will need to go up at least 4-6 on his Humalog dose for high readings over at least 180  Discussed blood sugar targets after meals  Unless his fasting readings are getting low he will stay on the same dose of [place]  He will discuss his leg pain with PCP and when able to increase his exercise  He is usually doing fairly well in his diet but discussed that he may need more formal meal planning with dietitian  More consistent follow-up   Patient Instructions  Dinner 8 units Humalog  Add 4-6 units extra when on steroids  Counseling time on subjects discussed in assessment and plan sections is over 50% of today's 25 minute visit     Elayne Snare 03/16/2018, 8:17 PM   Note: This office note was prepared with Dragon voice recognition system technology. Any transcriptional errors that result from this process are unintentional.

## 2018-03-18 ENCOUNTER — Emergency Department (HOSPITAL_COMMUNITY)
Admission: EM | Admit: 2018-03-18 | Discharge: 2018-03-19 | Disposition: A | Discharge status: Home / Self Care | Payer: 59 | Attending: Emergency Medicine | Admitting: Emergency Medicine

## 2018-03-18 ENCOUNTER — Encounter (HOSPITAL_COMMUNITY): Payer: Self-pay | Admitting: Emergency Medicine

## 2018-03-18 ENCOUNTER — Other Ambulatory Visit: Payer: Self-pay

## 2018-03-18 DIAGNOSIS — F17290 Nicotine dependence, other tobacco product, uncomplicated: Secondary | ICD-10-CM | POA: Diagnosis not present

## 2018-03-18 DIAGNOSIS — Z79899 Other long term (current) drug therapy: Secondary | ICD-10-CM | POA: Insufficient documentation

## 2018-03-18 DIAGNOSIS — I4510 Unspecified right bundle-branch block: Secondary | ICD-10-CM | POA: Diagnosis not present

## 2018-03-18 DIAGNOSIS — B20 Human immunodeficiency virus [HIV] disease: Secondary | ICD-10-CM | POA: Diagnosis not present

## 2018-03-18 DIAGNOSIS — Z794 Long term (current) use of insulin: Secondary | ICD-10-CM | POA: Insufficient documentation

## 2018-03-18 DIAGNOSIS — L509 Urticaria, unspecified: Secondary | ICD-10-CM | POA: Insufficient documentation

## 2018-03-18 DIAGNOSIS — R21 Rash and other nonspecific skin eruption: Secondary | ICD-10-CM | POA: Diagnosis present

## 2018-03-18 DIAGNOSIS — E119 Type 2 diabetes mellitus without complications: Secondary | ICD-10-CM | POA: Insufficient documentation

## 2018-03-18 LAB — CBC
HEMATOCRIT: 41.3 % (ref 39.0–52.0)
HEMOGLOBIN: 13.9 g/dL (ref 13.0–17.0)
MCH: 31.4 pg (ref 26.0–34.0)
MCHC: 33.7 g/dL (ref 30.0–36.0)
MCV: 93.2 fL (ref 80.0–100.0)
NRBC: 0 % (ref 0.0–0.2)
PLATELETS: 307 10*3/uL (ref 150–400)
RBC: 4.43 MIL/uL (ref 4.22–5.81)
RDW: 13 % (ref 11.5–15.5)
WBC: 18.6 10*3/uL — ABNORMAL HIGH (ref 4.0–10.5)

## 2018-03-18 LAB — BASIC METABOLIC PANEL
Anion gap: 14 (ref 5–15)
BUN: 23 mg/dL — ABNORMAL HIGH (ref 6–20)
CALCIUM: 9.3 mg/dL (ref 8.9–10.3)
CHLORIDE: 103 mmol/L (ref 98–111)
CO2: 23 mmol/L (ref 22–32)
Creatinine, Ser: 1.01 mg/dL (ref 0.61–1.24)
GFR calc non Af Amer: >60 mL/min (ref >60)
GLUCOSE: 223 mg/dL — AB (ref 70–99)
POTASSIUM: 3.7 mmol/L (ref 3.5–5.1)
Sodium: 140 mmol/L (ref 135–145)

## 2018-03-18 NOTE — ED Triage Notes (Addendum)
Pt reports allergic reaction that started two weeks ago, completed prednisone prescription that treated it but it started coming back again today. Welts noted to right side of neck, wrist, legs and buttocks, reports itchiness. Denies sob but reports it feels like his neck and chest is tight. Reports taking benadryl this morning with some relief.

## 2018-03-19 MED ORDER — PREDNISONE 20 MG PO TABS: ORAL_TABLET | ORAL | 0 refills | Status: DC

## 2018-03-19 MED ORDER — HYDROXYZINE HCL 25 MG PO TABS: 25.0000 mg | ORAL_TABLET | Freq: Four times a day (QID) | ORAL | 0 refills | Status: DC

## 2018-03-19 NOTE — ED Provider Notes (Signed)
[facility] EMERGENCY DEPARTMENT Provider Note   CSN: [id number] Arrival date & time: 03/18/18  2124     History   Chief Complaint Chief Complaint  Patient presents with  . Allergic Reaction    HPI Brett Walsh is a 52 y.o. male.  Patient is a 52 year old male with past medical history of HIV disease, diabetes.  He presents today for evaluation of rash and itching.  This is been present for 2 weeks.  He was initially prescribed prednisone and antihistamines which did help.  He completed this course 2 days ago, and now the rash and itching has returned.  He reports red, raised, hive-like lesions to his arms, neck, and torso.  These lesions are pruritic.  He denies any new contacts or exposures.  The history is provided by the patient.  Allergic Reaction  Presenting symptoms: itching and rash   Presenting symptoms: no difficulty breathing and no difficulty swallowing   Severity:  Moderate Duration:  2 days Context comment:  Unknown Relieved by:  Steroids and antihistamines   Past Medical History:  Diagnosis Date  . Deviated septum   . Diabetes mellitus without complication ([place])   . HIV disease [facility])     Patient Active Problem List   Diagnosis Date Noted  . Morbid obesity ([place]) 02/02/2018  . Hyperlipidemia associated with type 2 diabetes mellitus ([place]) 02/02/2018  . Acute non intractable tension-type headache 11/12/2017  . Contusion of face 02/06/2017  . GERD (gastroesophageal reflux disease) 12/02/2016  . Medication reaction 10/14/2016  . Depression (emotion) 07/23/2016  . Insomnia 07/23/2016  . Routine general medical examination at a health care facility 06/20/2016  . TMJ (temporomandibular joint syndrome) 01/21/2016  . Slow transit constipation 08/16/2014  . Thrush 07/10/2014  . HIV disease ([place]) 06/20/2014  . Diabetes type 2, uncontrolled ([place]) 06/20/2014    Past Surgical History:  Procedure Laterality Date  . [place], [id number]        Home Medications    Prior to Admission medications   Medication Sig Start Date End Date Taking? Authorizing Provider  Blood Glucose Monitoring Suppl (ONE TOUCH ULTRA MINI) w/Device KIT Use meter to check blood sugars 1-4 times daily as instructed. 10/14/16   Golden Circle, FNP  canagliflozin (INVOKANA) 300 MG TABS tablet Take 1 tablet (300 mg total) by mouth daily before breakfast. 03/16/18   Elayne Snare, MD  chlorhexidine (PERIDEX) 0.12 % solution Use as directed 15 mLs in the mouth or throat 2 (two) times daily.    [provider]  Continuous Blood Gluc Sensor (FREESTYLE LIBRE 14 DAY SENSOR) MISC USE 1 SENSOR EVERY 14 DAYS 03/09/18   Elayne Snare, MD  glucose blood (ONE TOUCH ULTRA TEST) test strip Use one strip per test. Test blood sugars 1-4 times daily as instructed. 01/14/18   Lance Sell, NP  insulin lispro (HUMALOG KWIKPEN) 100 UNIT/ML KiwkPen INJECT 10 UNITS UNDER THE SKIN THREE TIMES DAILY 03/16/18   Elayne Snare, MD  Insulin Pen Needle (PEN NEEDLES) 31G X 8 MM MISC Use to inject insulin as needed. 06/12/17   Lance Sell, NP  Lancets Misc. (ONE TOUCH SURESOFT) MISC Use 1 lancet per test. Test blood sugars 1-4 times per day as instructed. 10/14/16   Golden Circle, FNP  loratadine (CLARITIN) 10 MG tablet Take 10 mg by mouth daily as needed for allergies.    [provider]  methylPREDNISolone (MEDROL) 4 MG TBPK tablet 6 tabs today, 5  tabs day 2, 4 tabs day 3, 3 tabs day 4, 2 tabs day 5, 1 tab day 6. Take w/food. 03/11/18   Lance Sell, NP  rosuvastatin (CRESTOR) 5 MG tablet Take 1 tablet (5 mg total) by mouth daily. 12/22/17   Elayne Snare, MD  TRESIBA FLEXTOUCH 200 UNIT/ML SOPN INJECT 60 UNITS INTO THE SKIN EVERY NIGHT AT BEDTIME 11/26/17   Elayne Snare, MD  TRIUMEQ 600-50-300 MG tablet TAKE 1 TABLET BY MOUTH DAILY 11/24/17   Campbell Riches, MD    Family History Family History  Problem Relation Age of Onset  .  Diabetes Brother   . Diabetes Mother   . Diabetes Father   . Heart failure Father   . Diabetes Sister     Social History Social History   Tobacco Use  . Smoking status: Light Tobacco Smoker    Years: 36.00    Types: Cigarettes, E-cigarettes  . Smokeless tobacco: Former Systems developer  . Tobacco comment: 3-4/day  Substance Use Topics  . Alcohol use: Yes    Alcohol/week: 1.0 standard drinks    Types: 1 Standard drinks or equivalent per week    Comment: socially  . Drug use: No    Types: Hydromorphone     Allergies   Metformin and related and Protonix [pantoprazole sodium]   Review of Systems Review of Systems  HENT: Negative for trouble swallowing.   Skin: Positive for itching and rash.  All other systems reviewed and are negative.    Physical Exam Updated Vital Signs BP 136/85 (BP Location: Left Arm)   Pulse (!) 102   Temp 98.1 F (36.7 C) (Oral)   Resp 16   Ht 5' 6"  (1.676 m)   Wt 110.2 kg   SpO2 99%   BMI 39.22 kg/m   Physical Exam  Constitutional: He is oriented to person, place, and time. He appears well-developed and well-nourished. No distress.  HENT:  Head: Normocephalic and atraumatic.  Mouth/Throat: Oropharynx is clear and moist.  Neck: Normal range of motion. Neck supple.  Cardiovascular: Normal rate and regular rhythm. Exam reveals no friction rub.  No murmur heard. Pulmonary/Chest: Effort normal and breath sounds normal. No respiratory distress. He has no wheezes. He has no rales.  Abdominal: Soft. Bowel sounds are normal. He exhibits no distension. There is no tenderness.  Musculoskeletal: Normal range of motion. He exhibits no edema.  Neurological: He is alert and oriented to person, place, and time. Coordination normal.  Skin: Skin is warm and dry. Rash noted. He is not diaphoretic.  There is a scattered, urticarial rash present to the neck, chest, back, and all extremities.  Nursing note and vitals reviewed.    ED Treatments / Results   Labs (all labs ordered are listed, but only abnormal results are displayed) Labs Reviewed  BASIC METABOLIC PANEL - Abnormal; Notable for the following components:      Result Value   Glucose, Bld 223 (*)    BUN 23 (*)    All other components within normal limits  CBC - Abnormal; Notable for the following components:   WBC 18.6 (*)    All other components within normal limits    EKG None  Radiology No results found.  Procedures Procedures (including critical care time)  Medications Ordered in ED Medications - No data to display   Initial Impression / Assessment and Plan / ED Course  I have reviewed the triage vital signs and the nursing notes.  Pertinent labs & imaging results  that were available during my care of the patient were reviewed by me and considered in my medical decision making (see chart for details).  Patient with a recurrence of urticaria after completing steroids and antihistamines.  He will be prescribed another course of prednisone and is to follow-up with an allergist for allergy testing if symptoms persist.  No evidence for anaphylaxis or emergent condition.  Final Clinical Impressions(s) / ED Diagnoses   Final diagnoses:  None    ED Discharge Orders    None       Veryl Speak, MD 03/19/18 [phone]

## 2018-03-19 NOTE — Discharge Instructions (Addendum)
Prednisone and hydroxyzine as prescribed.  Follow-up with your primary doctor if symptoms are not improving in the next few days to discuss referral to an allergist.  Return to the emergency department if you develop throat swelling, difficulty breathing or swallowing, or other new and concerning symptoms.

## 2018-03-19 NOTE — ED Notes (Signed)
Patient verbalizes understanding of discharge instructions. Opportunity for questioning and answers were provided. Armband removed by staff, pt discharged from ED.  

## 2018-03-29 ENCOUNTER — Other Ambulatory Visit: Payer: Self-pay | Admitting: Endocrinology

## 2018-04-04 ENCOUNTER — Other Ambulatory Visit: Payer: Self-pay | Admitting: Family Medicine

## 2018-04-05 ENCOUNTER — Other Ambulatory Visit: Payer: Self-pay | Admitting: *Deleted

## 2018-04-05 NOTE — Telephone Encounter (Signed)
I called pt to see if he needs Refill on pantoprazole 40 mg. We received refill req, but this medication is listed as allergy that causes HIVES.

## 2018-04-10 ENCOUNTER — Other Ambulatory Visit: Payer: Self-pay | Admitting: Endocrinology

## 2018-04-20 ENCOUNTER — Other Ambulatory Visit: Payer: Self-pay | Admitting: Infectious Diseases

## 2018-04-20 DIAGNOSIS — B20 Human immunodeficiency virus [HIV] disease: Secondary | ICD-10-CM

## 2018-04-24 ENCOUNTER — Other Ambulatory Visit: Payer: Self-pay | Admitting: Endocrinology

## 2018-05-13 ENCOUNTER — Encounter: Payer: Self-pay | Admitting: Nurse Practitioner

## 2018-05-31 IMAGING — DX DG ABDOMEN 2V
3 series · 3 of 3 positions shown · non-contrast
Comparison: None.

CLINICAL DATA: Diarrhea for 2 years.

EXAM:
ABDOMEN - 2 VIEW

[abdomen erect]
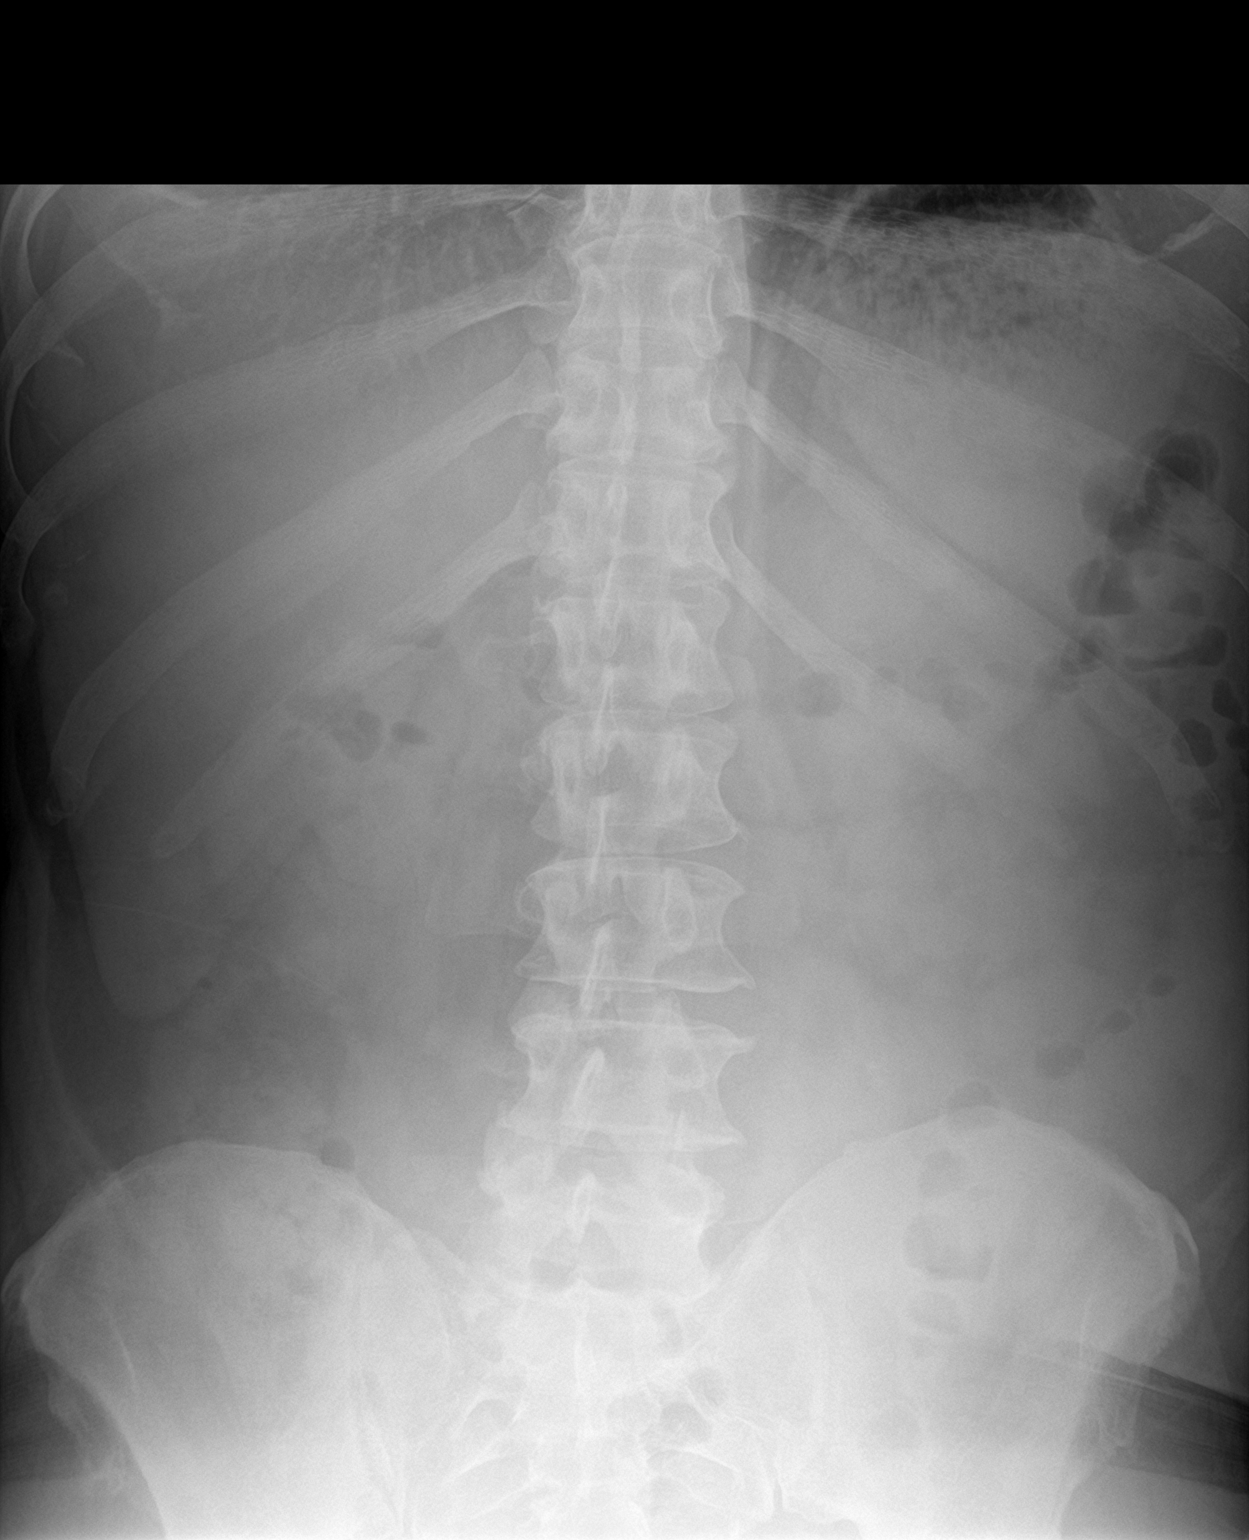

[abdomen supine (1 of 2)]
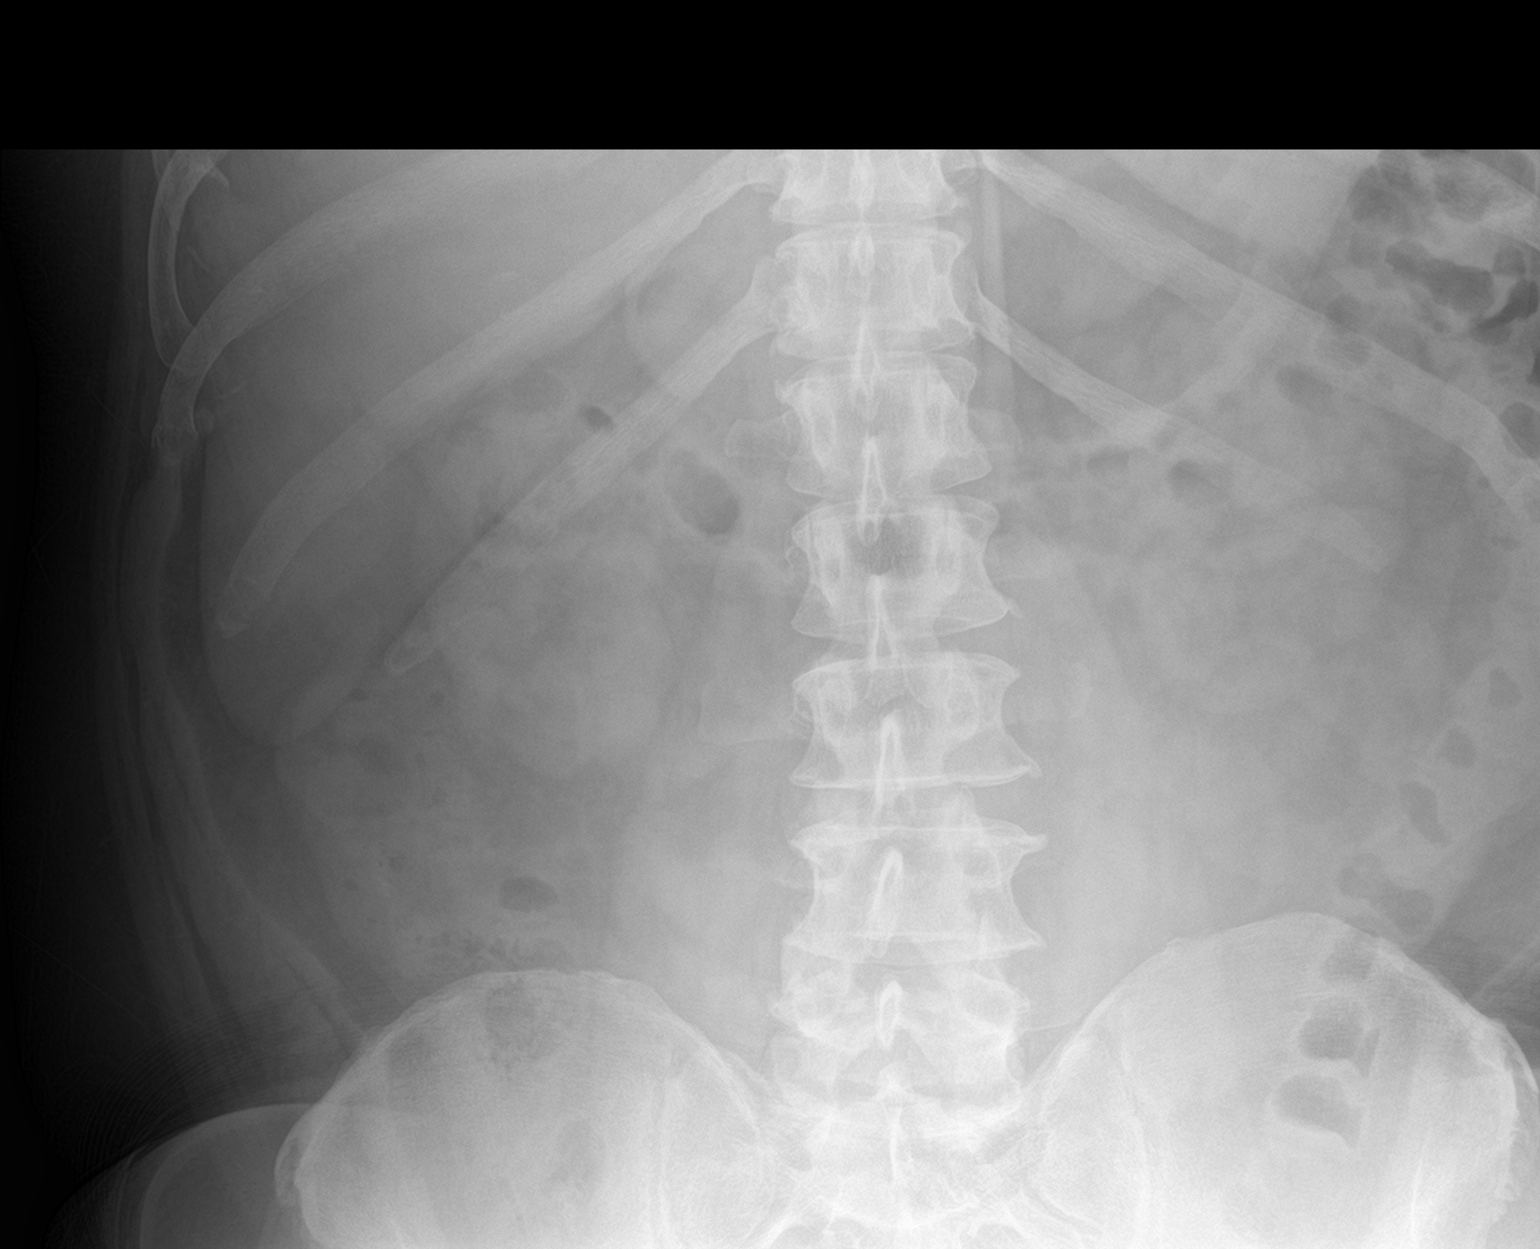

[abdomen supine (2 of 2)]
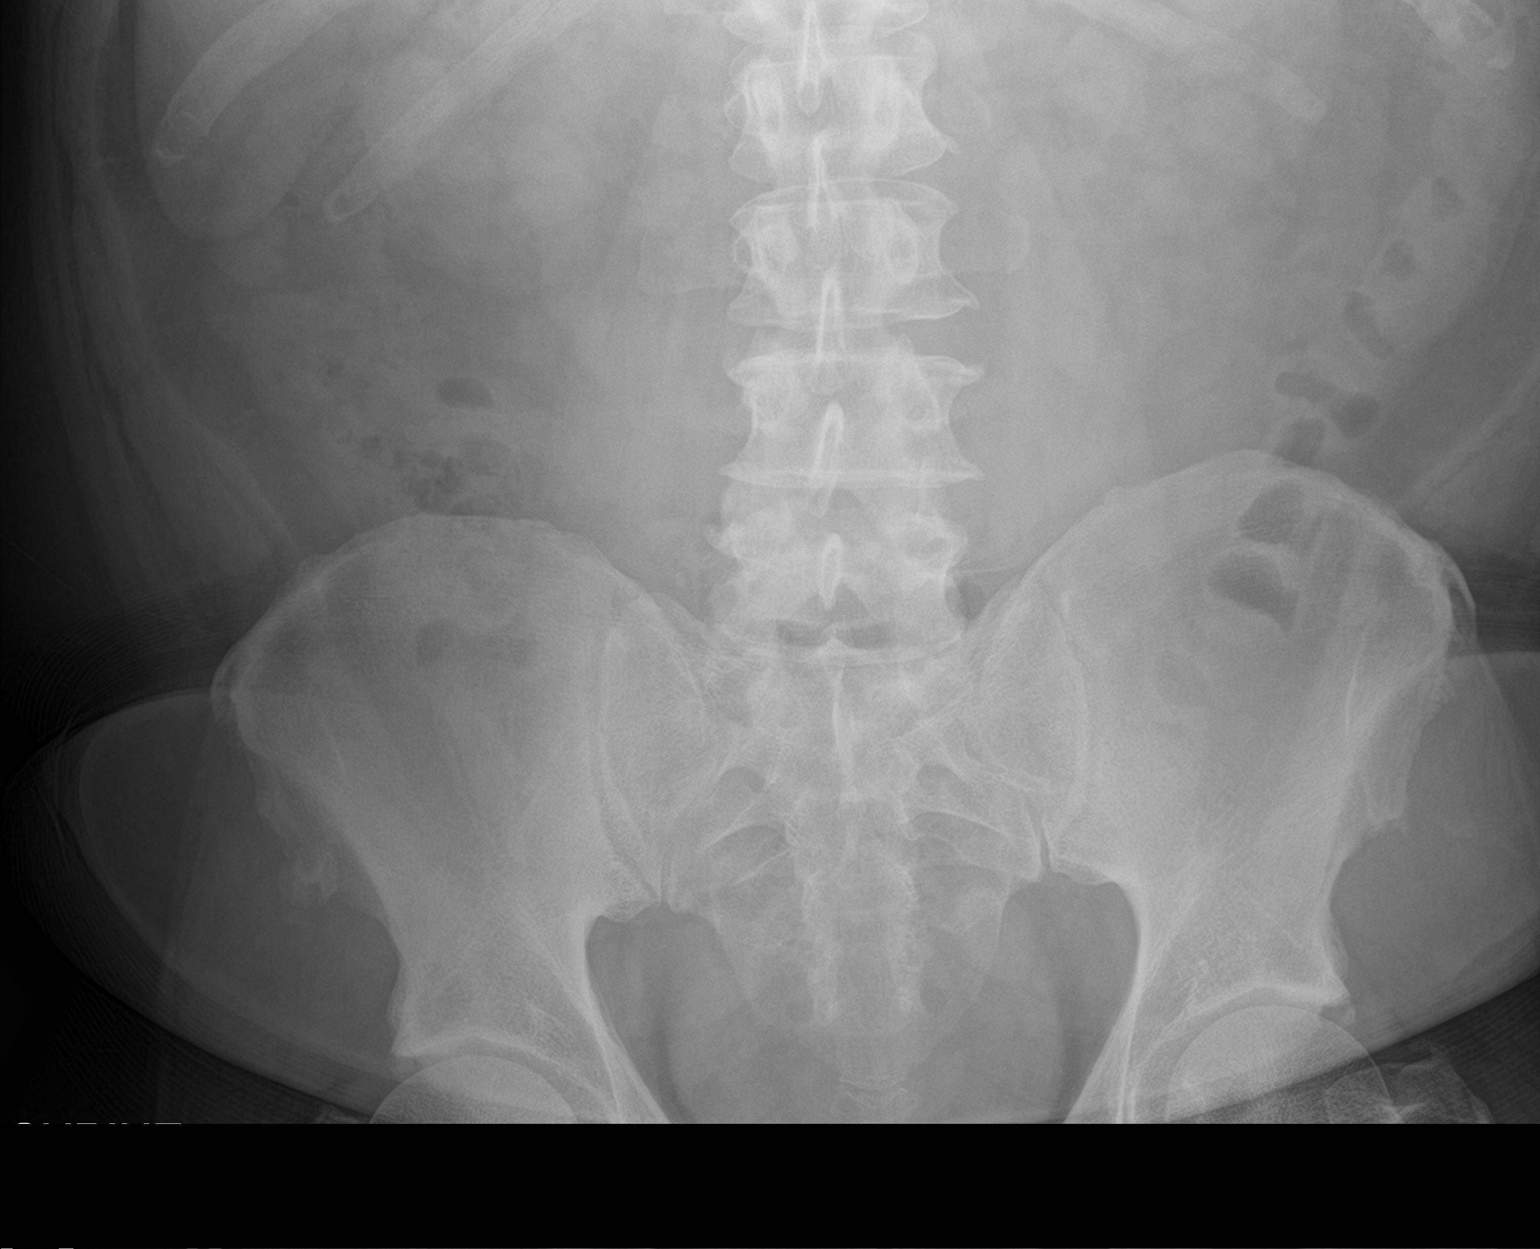

[3 of 3 positions shown; findings below may reference images not displayed]

FINDINGS: The bowel gas pattern is normal. There is no evidence of free air.
No radio-opaque calculi or other significant radiographic
abnormality is seen.
IMPRESSION: Negative.

## 2018-06-01 ENCOUNTER — Encounter: Payer: Self-pay | Admitting: Family Medicine

## 2018-06-01 ENCOUNTER — Ambulatory Visit: Payer: 59 | Admitting: Family Medicine

## 2018-06-01 DIAGNOSIS — A084 Viral intestinal infection, unspecified: Secondary | ICD-10-CM | POA: Diagnosis not present

## 2018-06-01 MED ORDER — ONDANSETRON 4 MG PO TBDP: 4.0000 mg | ORAL_TABLET | Freq: Three times a day (TID) | ORAL | 0 refills | Status: DC | PRN

## 2018-06-01 NOTE — Patient Instructions (Signed)
Viral Gastroenteritis, Adult    Viral gastroenteritis is also known as the stomach flu. This condition is caused by certain germs (viruses). These germs can be passed from person to person very easily (are very contagious). This condition can cause sudden watery poop (diarrhea), fever, and throwing up (vomiting).  Having watery poop and throwing up can make you feel weak and cause you to get dehydrated. Dehydration can make you tired and thirsty, make you have a dry mouth, and make it so you pee (urinate) less often. Older adults and people with other diseases or a weak defense system (immune system) are at higher risk for dehydration. It is important to replace the fluids that you lose from having watery poop and throwing up.  Follow these instructions at home:  Follow instructions from your doctor about how to care for yourself at home.  Eating and drinking  Follow these instructions as told by your doctor:   Take an oral rehydration solution (ORS). This is a drink that is sold at pharmacies and stores.   Drink clear fluids in small amounts as you are able, such as:  ? Water.  ? Ice chips.  ? Diluted fruit juice.  ? Low-calorie sports drinks.   Eat bland, easy-to-digest foods in small amounts as you are able, such as:  ? Bananas.  ? Applesauce.  ? Rice.  ? Low-fat (lean) meats.  ? Toast.  ? Crackers.   Avoid fluids that have a lot of sugar or caffeine in them.   Avoid alcohol.   Avoid spicy or fatty foods.  General instructions     Drink enough fluid to keep your pee (urine) clear or pale yellow.   Wash your hands often. If you cannot use soap and water, use hand sanitizer.   Make sure that all people in your home wash their hands well and often.   Rest at home while you get better.   Take over-the-counter and prescription medicines only as told by your doctor.   Watch your condition for any changes.   Take a warm bath to help with any burning or pain from having watery poop.   Keep all follow-up  visits as told by your doctor. This is important.  Contact a doctor if:   You cannot keep fluids down.   Your symptoms get worse.   You have new symptoms.   You feel light-headed or dizzy.   You have muscle cramps.  Get help right away if:   You have chest pain.   You feel very weak or you pass out (faint).   You see blood in your throw-up.   Your throw-up looks like coffee grounds.   You have bloody or black poop (stools) or poop that look like tar.   You have a very bad headache, a stiff neck, or both.   You have a rash.   You have very bad pain, cramping, or bloating in your belly (abdomen).   You have trouble breathing.   You are breathing very quickly.   Your heart is beating very quickly.   Your skin feels cold and clammy.   You feel confused.   You have pain when you pee.   You have signs of dehydration, such as:  ? Dark pee, hardly any pee, or no pee.  ? Cracked lips.  ? Dry mouth.  ? Sunken eyes.  ? Sleepiness.  ? Weakness.  This information is not intended to replace advice given to you by your   health care provider. Make sure you discuss any questions you have with your health care provider.  Document Released: 11/05/2007 Document Revised: 02/10/2018 Document Reviewed: 01/23/2015  Elsevier Interactive Patient Education  2019 Elsevier Inc[employer]

## 2018-06-01 NOTE — Progress Notes (Signed)
Brett Walsh - 52 y.o. male MRN [id number]  Date of birth: December 11, 1965  Subjective Chief Complaint  Patient presents with  . Diarrhea    Admits to vomitting-ongoing for two days. Denies fevers, body aches or abdominal pain. Has been taking Immodium.     HPI   Brett Marionverardo Mounts is a 52 y.o. male with complaint of gastrointestinal symptoms of watery diarrhea, nausea, vomiting for 2 days.  Vomit contains normal stomach contents.  Denies fever, chills, congestion, body aches.  No blood in stool.  Diarrhea improved with immodium.  He is HIV + and compliant with current HIV treatment medications  ROS:  A comprehensive ROS was completed and negative except as noted per HPI   Allergies  Allergen Reactions  . Metformin And Related     GI upset  . Protonix [Pantoprazole Sodium] Hives    Past Medical History:  Diagnosis Date  . Deviated septum   . Diabetes mellitus without complication ([facility])   . HIV disease [place])     Past Surgical History:  Procedure Laterality Date  . NASAL SEPTUM SURGERY  1981, [id number]    Social History   Socioeconomic History  . Marital status: Single    Spouse name: Not on file  . Number of children: 0  . Years of education: 6714  . Highest education level: Not on file  Occupational History  . 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 Financial resource strain: Not on file  . Food insecurity:    Worry: Not on file    Inability: Not on file  . Transportation needs:    Medical: Not on file    Non-medical: Not on file  Tobacco Use  . Smoking status: Light Tobacco Smoker    Years: 36.00    Types: Cigarettes, E-cigarettes  . Smokeless tobacco: Former NeurosurgeonUser  . Tobacco comment: 3-4/day  Substance and Sexual Activity  . Alcohol use: Yes    Alcohol/week: 1.0 standard drinks    Types: 1 Standard drinks or equivalent per week    Comment: socially  . Drug use: No    Types: Hydromorphone  . Sexual activity: Never    Partners: Male   Comment: pt. declined condoms  Lifestyle  . Physical activity:    Days per week: Not on file    Minutes per session: Not on file  . Stress: Not on file  Relationships  . Social connections:    Talks on phone: Not on file    Gets together: Not on file    Attends religious service: Not on file    Active member of club or organization: Not on file    Attends meetings of clubs or organizations: Not on file    Relationship status: Not on file  Other Topics Concern  . Not on file  Social History Narrative   Fun: Watch TV, veg at home.     Family History  Problem Relation Age of Onset  . Diabetes Brother   . Diabetes Mother   . Diabetes Father   . Heart failure Father   . Diabetes Sister     Health Maintenance  Topic Date Due  . PNEUMOCOCCAL POLYSACCHARIDE VACCINE AGE 65-64 HIGH RISK  12/21/1969  . COLONOSCOPY  12/21/2017  . OPHTHALMOLOGY EXAM  02/17/2018  . FOOT EXAM  09/01/2018  . HEMOGLOBIN A1C  09/15/2018  . URINE MICROALBUMIN  09/29/2018  . TETANUS/TDAP  10/01/2023  . INFLUENZA VACCINE  Completed  . HIV Screening  Completed    -----------------------------------------------------------------------------------------------------------------------------------------------------------------------------------------------------------------  Physical Exam BP 132/68   Pulse 98   Temp 97.8 F (36.6 C) (Oral)   Ht 5\' 6"  (1.676 m)   Wt 244 lb (110.7 kg)   SpO2 98%   BMI 39.38 kg/m   Physical Exam Constitutional:      Appearance: Normal appearance.  HENT:     Head: Normocephalic and atraumatic.     Nose: Nose normal.     Mouth/Throat:     Mouth: Mucous membranes are moist.  Eyes:     General: No scleral icterus. Neck:     Musculoskeletal: Normal range of motion and neck supple.  Cardiovascular:     Rate and Rhythm: Normal rate and regular rhythm.  Pulmonary:     Effort: Pulmonary effort is normal.     Breath sounds: Normal breath sounds.  Abdominal:     General:  There is no distension.     Palpations: Abdomen is soft.     Tenderness: There is no abdominal tenderness. There is no guarding.  Skin:    General: Skin is warm and dry.     Findings: No rash.  Neurological:     General: No focal deficit present.     Mental Status: He is alert.  Psychiatric:        Mood and Affect: Mood normal.        Behavior: Behavior normal.     ------------------------------------------------------------------------------------------------------------------------------------------------------------------------------------------------------------------- Assessment and Plan  Viral gastroenteritis Symptoms consistent with viral gastroenteritis Rx for zofran prn for nausea  I have recommended small amounts clear fluids frequently, soups, juices, water and advance diet as tolerated. Return office visit if symptoms persist or worsen; I have alerted the patient to call if high fever, dehydration, marked weakness, fainting, increased abdominal pain, blood in stool or vomit.

## 2018-06-01 NOTE — Assessment & Plan Note (Signed)
Symptoms consistent with viral gastroenteritis Rx for zofran prn for nausea  I have recommended small amounts clear fluids frequently, soups, juices, water and advance diet as tolerated. Return office visit if symptoms persist or worsen; I have alerted the patient to call if high fever, dehydration, marked weakness, fainting, increased abdominal pain, blood in stool or vomit.

## 2018-06-11 ENCOUNTER — Ambulatory Visit: Payer: 59 | Admitting: Nurse Practitioner

## 2018-06-11 ENCOUNTER — Other Ambulatory Visit (INDEPENDENT_AMBULATORY_CARE_PROVIDER_SITE_OTHER): Payer: 59

## 2018-06-11 ENCOUNTER — Encounter: Payer: Self-pay | Admitting: Nurse Practitioner

## 2018-06-11 VITALS — BP 102/70 | HR 88 | Ht 66.0 in | Wt 242.0 lb

## 2018-06-11 DIAGNOSIS — Z0000 Encounter for general adult medical examination without abnormal findings: Secondary | ICD-10-CM

## 2018-06-11 DIAGNOSIS — Z125 Encounter for screening for malignant neoplasm of prostate: Secondary | ICD-10-CM

## 2018-06-11 DIAGNOSIS — E11649 Type 2 diabetes mellitus with hypoglycemia without coma: Secondary | ICD-10-CM | POA: Diagnosis not present

## 2018-06-11 DIAGNOSIS — Z1211 Encounter for screening for malignant neoplasm of colon: Secondary | ICD-10-CM

## 2018-06-11 DIAGNOSIS — E782 Mixed hyperlipidemia: Secondary | ICD-10-CM | POA: Insufficient documentation

## 2018-06-11 LAB — COMPREHENSIVE METABOLIC PANEL
ALK PHOS: 68 U/L (ref 39–117)
ALT: 21 U/L (ref 0–53)
AST: 15 U/L (ref 0–37)
Albumin: 4.2 g/dL (ref 3.5–5.2)
BILIRUBIN TOTAL: 0.5 mg/dL (ref 0.2–1.2)
BUN: 24 mg/dL — ABNORMAL HIGH (ref 6–23)
CO2: 22 meq/L (ref 19–32)
CREATININE: 0.87 mg/dL (ref 0.40–1.50)
Calcium: 9.2 mg/dL (ref 8.4–10.5)
Chloride: 106 mEq/L (ref 96–112)
GFR: 98.53 mL/min (ref >60.00)
Glucose, Bld: 93 mg/dL (ref 70–99)
Potassium: 4 mEq/L (ref 3.5–5.1)
Sodium: 140 mEq/L (ref 135–145)
Total Protein: 7.3 g/dL (ref 6.0–8.3)

## 2018-06-11 LAB — CBC
HCT: 39.9 % (ref 39.0–52.0)
Hemoglobin: 13.6 g/dL (ref 13.0–17.0)
MCHC: 34.1 g/dL (ref 30.0–36.0)
MCV: 93.1 fl (ref 78.0–100.0)
Platelets: 294 10*3/uL (ref 150.0–400.0)
RBC: 4.29 Mil/uL (ref 4.22–5.81)
RDW: 14 % (ref 11.5–15.5)
WBC: 12.2 10*3/uL — AB (ref 4.0–10.5)

## 2018-06-11 LAB — PSA: PSA: 0.46 ng/mL (ref 0.10–4.00)

## 2018-06-11 NOTE — Assessment & Plan Note (Signed)
Reviewed annual screening exams, healthy lifestyle, weight loss, additional information provided on AVS Lipid panel done 01/27/2018 - CBC; Future - Comprehensive metabolic panel; Future - PSA; Future - Ambulatory referral to Gastroenterology  Screening for prostate cancer- PSA; Future  Screening for colon cancer- Ambulatory referral to Gastroenterology

## 2018-06-11 NOTE — Assessment & Plan Note (Signed)
Continue current medications Lipid panel up to date, WNL 01/27/18

## 2018-06-11 NOTE — Patient Instructions (Signed)
Head downstairs for labs  I will plan to see you back in 1 year, sooner if needed   Health Maintenance, Male A healthy lifestyle and preventive care is important for your health and wellness. Ask your health care provider about what schedule of regular examinations is right for you. What should I know about weight and diet? Eat a Healthy Diet  Eat plenty of vegetables, fruits, whole grains, low-fat dairy products, and lean protein.  Do not eat a lot of foods high in solid fats, added sugars, or salt.  Maintain a Healthy Weight Regular exercise can help you achieve or maintain a healthy weight. You should:  Do at least 150 minutes of exercise each week. The exercise should increase your heart rate and make you sweat (moderate-intensity exercise).  Do strength-training exercises at least twice a week. Watch Your Levels of Cholesterol and Blood Lipids  Have your blood tested for lipids and cholesterol every 5 years starting at 51 years of age. If you are at high risk for heart disease, you should start having your blood tested when you are 51 years old. You may need to have your cholesterol levels checked more often if: ? Your lipid or cholesterol levels are high. ? You are older than 51 years of age. ? You are at high risk for heart disease. What should I know about cancer screening? Many types of cancers can be detected early and may often be prevented. Lung Cancer  You should be screened every year for lung cancer if: ? You are a current smoker who has smoked for at least 30 years. ? You are a former smoker who has quit within the past 15 years.  Talk to your health care provider about your screening options, when you should start screening, and how often you should be screened. Colorectal Cancer  Routine colorectal cancer screening usually begins at 51 years of age and should be repeated every 5-10 years until you are 51 years old. You may need to be screened more often if early  forms of precancerous polyps or small growths are found. Your health care provider may recommend screening at an earlier age if you have risk factors for colon cancer.  Your health care provider may recommend using home test kits to check for hidden blood in the stool.  A small camera at the end of a tube can be used to examine your colon (sigmoidoscopy or colonoscopy). This checks for the earliest forms of colorectal cancer. Prostate and Testicular Cancer  Depending on your age and overall health, your health care provider may do certain tests to screen for prostate and testicular cancer.  Talk to your health care provider about any symptoms or concerns you have about testicular or prostate cancer. Skin Cancer  Check your skin from head to toe regularly.  Tell your health care provider about any new moles or changes in moles, especially if: ? There is a change in a mole's size, shape, or color. ? You have a mole that is larger than a pencil eraser.  Always use sunscreen. Apply sunscreen liberally and repeat throughout the day.  Protect yourself by wearing long sleeves, pants, a wide-brimmed hat, and sunglasses when outside. What should I know about heart disease, diabetes, and high blood pressure?  If you are 61-65 years of age, have your blood pressure checked every 3-5 years. If you are 34 years of age or older, have your blood pressure checked every year. You should have your blood  pressure measured twice-once when you are at a hospital or clinic, and once when you are not at a hospital or clinic. Record the average of the two measurements. To check your blood pressure when you are not at a hospital or clinic, you can use: ? An automated blood pressure machine at a pharmacy. ? A home blood pressure monitor.  Talk to your health care provider about your target blood pressure.  If you are between 27-74 years old, ask your health care provider if you should take aspirin to prevent heart  disease.  Have regular diabetes screenings by checking your fasting blood sugar level. ? If you are at a normal weight and have a low risk for diabetes, have this test once every three years after the age of 57. ? If you are overweight and have a high risk for diabetes, consider being tested at a younger age or more often.  A one-time screening for abdominal aortic aneurysm (AAA) by ultrasound is recommended for men aged 20-75 years who are current or former smokers. What should I know about preventing infection? Hepatitis B If you have a higher risk for hepatitis B, you should be screened for this virus. Talk with your health care provider to find out if you are at risk for hepatitis B infection. Hepatitis C Blood testing is recommended for:  Everyone born from [birth year] through 1963.  Anyone with known risk factors for hepatitis C. Sexually Transmitted Diseases (STDs)  You should be screened each year for STDs including gonorrhea and chlamydia if: ? You are sexually active and are younger than 51 years of age. ? You are older than 51 years of age and your health care provider tells you that you are at risk for this type of infection. ? Your sexual activity has changed since you were last screened and you are at an increased risk for chlamydia or gonorrhea. Ask your health care provider if you are at risk.  Talk with your health care provider about whether you are at high risk of being infected with HIV. Your health care provider may recommend a prescription medicine to help prevent HIV infection. What else can I do?  Schedule regular health, dental, and eye exams.  Stay current with your vaccines (immunizations).  Do not use any tobacco products, such as cigarettes, chewing tobacco, and e-cigarettes. If you need help quitting, ask your health care provider.  Limit alcohol intake to no more than 2 drinks per day. One drink equals 12 ounces of beer, 5 ounces of wine, or 1 ounces of hard  liquor.  Do not use street drugs.  Do not share needles.  Ask your health care provider for help if you need support or information about quitting drugs.  Tell your health care provider if you often feel depressed.  Tell your health care provider if you have ever been abused or do not feel safe at home. This information is not intended to replace advice given to you by your health care provider. Make sure you discuss any questions you have with your health care provider. Document Released: 11/15/2007 Document Revised: 01/16/2016 Document Reviewed: 02/20/2015 Elsevier Interactive Patient Education  2019 [employer].

## 2018-06-11 NOTE — Progress Notes (Signed)
Brett Walsh is a 53 y.o. male with the following history as recorded in Epic[facility]:  Patient Active Problem List   Diagnosis Date Noted  . Viral gastroenteritis 06/01/2018  . Morbid obesity ([place]) 02/02/2018  . Hyperlipidemia associated with type 2 diabetes mellitus ([place]) 02/02/2018  . Acute non intractable tension-type headache 11/12/2017  . Contusion of face 02/06/2017  . GERD (gastroesophageal reflux disease) 12/02/2016  . Medication reaction 10/14/2016  . Depression (emotion) 07/23/2016  . Insomnia 07/23/2016  . Routine general medical examination at a health care facility 06/20/2016  . TMJ (temporomandibular joint syndrome) 01/21/2016  . Slow transit constipation 08/16/2014  . Thrush 07/10/2014  . HIV disease ([place]) 06/20/2014  . Diabetes type 2, uncontrolled ([place]) 06/20/2014    Current Outpatient Medications  Medication Sig Dispense Refill  . Blood Glucose Monitoring Suppl (ONE TOUCH ULTRA MINI) w/Device KIT Use meter to check blood sugars 1-4 times daily as instructed. 1 each 0  . canagliflozin (INVOKANA) 300 MG TABS tablet Take 1 tablet (300 mg total) by mouth daily before breakfast. 30 tablet 3  . chlorhexidine (PERIDEX) 0.12 % solution Use as directed 15 mLs in the mouth or throat 2 (two) times daily.    . Continuous Blood Gluc Sensor (FREESTYLE LIBRE 14 DAY SENSOR) MISC USE 1 SENSOR EVERY 14 DAYS 2 each 2  . glucose blood (ONE TOUCH ULTRA TEST) test strip Use one strip per test. Test blood sugars 1-4 times daily as instructed. 100 each 12  . hydrOXYzine (ATARAX/VISTARIL) 25 MG tablet Take 1 tablet (25 mg total) by mouth every 6 (six) hours. 20 tablet 0  . insulin lispro (HUMALOG KWIKPEN) 100 UNIT/ML KiwkPen INJECT 10 UNITS UNDER THE SKIN THREE TIMES DAILY 9 mL 0  . Insulin Pen Needle (PEN NEEDLES) 31G X 8 MM MISC Use to inject insulin as needed. 100 each 5  . Lancets Misc. (ONE TOUCH SURESOFT) MISC Use 1 lancet per test. Test blood sugars 1-4 times per day as instructed. 1  each 1  . loratadine (CLARITIN) 10 MG tablet Take 10 mg by mouth daily as needed for allergies.    Marland Kitchen ondansetron (ZOFRAN ODT) 4 MG disintegrating tablet Take 1 tablet (4 mg total) by mouth every 8 (eight) hours as needed for nausea or vomiting. 20 tablet 0  . rosuvastatin (CRESTOR) 5 MG tablet Take 1 tablet (5 mg total) by mouth daily. 90 tablet 3  . TRESIBA FLEXTOUCH 200 UNIT/ML SOPN INJECT 60 UNITS UNDER THE SKIN EVERY NIGHT AT BEDTIME 9 pen 2  . TRIUMEQ 600-50-300 MG tablet TAKE 1 TABLET BY MOUTH DAILY 90 tablet 1   No current facility-administered medications for this visit.     Allergies: Metformin and related and Protonix [pantoprazole sodium]  Past Medical History:  Diagnosis Date  . Deviated septum   . Diabetes mellitus without complication ([place])   . HIV disease [facility])     Past Surgical History:  Procedure Laterality Date  . NASAL SEPTUM SURGERY  1981, [id number]    Family History  Problem Relation Age of Onset  . Diabetes Brother   . Diabetes Mother   . Diabetes Father   . Heart failure Father   . Diabetes Sister     Social History   Tobacco Use  . Smoking status: Light Tobacco Smoker    Years: 36.00    Types: Cigarettes, E-cigarettes  . Smokeless tobacco: Former Systems developer  . Tobacco comment: 3-4/day  Substance Use Topics  . Alcohol use: Yes    Alcohol/week:  1.0 standard drinks    Types: 1 Standard drinks or equivalent per week    Comment: socially     Subjective:  Brett Walsh is here today for CPE. He continues regular follow up care with ID for HIV and endocrinology for diabetes management, HLD. Last dental exam: 2019 Last vision exam: 2018[facility] PSA: ordered Colonoscopy: referral placed Lipids: lipid panel up to date, by ID DM screening- A1c up to date, by endocrinology  Vaccinations: up to date Diet and exercise: no regular diet or exericse, works a lot and hard to follow special diet or exercise  Review of Systems  Constitutional:  Negative for chills and fever.  HENT: Negative for hearing loss.   Eyes: Negative for blurred vision and double vision.  Respiratory: Negative for cough and shortness of breath.   Cardiovascular: Negative for chest pain and palpitations.  Gastrointestinal: Negative for abdominal pain, constipation, diarrhea, nausea and vomiting.  Genitourinary: Negative for dysuria, frequency, hematuria and urgency.  Musculoskeletal: Negative for falls.  Skin: Negative for rash.  Neurological: Negative for dizziness, speech change, loss of consciousness and weakness.  Endo/Heme/Allergies: Does not bruise/bleed easily.  Psychiatric/Behavioral: Negative for depression. The patient is not nervous/anxious.    Objective:  Vitals:   06/11/18 0801  BP: 102/70  Pulse: 88  SpO2: 97%  Weight: 242 lb (109.8 kg)  Height: 5' 6"  (1.676 m)  Body mass index is 39.06 kg/m.   General: Well developed, well nourished, in no acute distress  Skin : Warm and dry.  Head: Normocephalic and atraumatic  Eyes: Sclera and conjunctiva clear; pupils round and reactive to light; extraocular movements intact  Ears: External normal; canals clear; tympanic membranes normal  Oropharynx: Pink, supple. No suspicious lesions  Neck: Supple without thyromegaly, adenopathy  Lungs: Respirations unlabored; clear to auscultation bilaterally without wheeze, rales, rhonchi  CVS exam: normal rate and regular rhythm, S1 and S2 normal.  Abdomen: Soft; nontender; nondistended; normoactive bowel sounds; no masses or hepatosplenomegaly  Musculoskeletal: No deformities; no active joint inflammation  Extremities: No edema, cyanosis, clubbing  Vessels: Symmetric bilaterally  Neurologic: Alert and oriented; speech intact; face symmetrical; moves all extremities well; CNII-XII intact without focal deficit  Psychiatric: Normal mood and affect.   Assessment:  1. Routine general medical examination at a health care facility   2. Screening for colon  cancer   3. Screening for prostate cancer   4. Uncontrolled type 2 diabetes mellitus with hypoglycemia without coma ([place])   5. Mixed hyperlipidemia     Plan:   Return in about 1 year (around 06/12/2019) for CPE.  Orders Placed This Encounter  Procedures  . CBC    Standing Status:   Future    Number of Occurrences:   1    Standing Expiration Date:   06/12/2019  . Comprehensive metabolic panel    Standing Status:   Future    Number of Occurrences:   1    Standing Expiration Date:   06/12/2019  . PSA    Standing Status:   Future    Number of Occurrences:   1    Standing Expiration Date:   06/12/2019  . Ambulatory referral to Gastroenterology    Referral Priority:   Routine    Referral Type:   Consultation    Referral Reason:   Specialty Services Required    Number of Visits Requested:   1    Requested Prescriptions    No prescriptions requested or ordered in this encounter

## 2018-06-11 NOTE — Assessment & Plan Note (Addendum)
Will request records from ophthalmology to update eye exam in chart Continue regular follow up with endocrinology

## 2018-06-15 ENCOUNTER — Other Ambulatory Visit (INDEPENDENT_AMBULATORY_CARE_PROVIDER_SITE_OTHER): Payer: 59

## 2018-06-15 ENCOUNTER — Telehealth: Payer: Self-pay | Admitting: *Deleted

## 2018-06-15 DIAGNOSIS — E1165 Type 2 diabetes mellitus with hyperglycemia: Secondary | ICD-10-CM | POA: Diagnosis not present

## 2018-06-15 DIAGNOSIS — Z794 Long term (current) use of insulin: Secondary | ICD-10-CM

## 2018-06-15 DIAGNOSIS — E782 Mixed hyperlipidemia: Secondary | ICD-10-CM | POA: Diagnosis not present

## 2018-06-15 LAB — LIPID PANEL
CHOL/HDL RATIO: 4
CHOLESTEROL: 162 mg/dL (ref 0–200)
HDL: 38.1 mg/dL — ABNORMAL LOW (ref >39.00)
NONHDL: 123.5
Triglycerides: 246 mg/dL — ABNORMAL HIGH (ref 0.0–149.0)
VLDL: 49.2 mg/dL — ABNORMAL HIGH (ref 0.0–40.0)

## 2018-06-15 LAB — LDL CHOLESTEROL, DIRECT: Direct LDL: 101 mg/dL

## 2018-06-15 LAB — HEMOGLOBIN A1C: Hgb A1c MFr Bld: 6.9 % — ABNORMAL HIGH (ref 4.6–6.5)

## 2018-06-15 NOTE — Telephone Encounter (Signed)
Patient brought in [facility] paperwork for Dr Ninetta Lights to fill out. He states he has been out intermittently with various illnesses over the past few months. He spoke with his supervisor who suggested he get FMLA paperwork to cover this and any doctor visits.  He will be transitioning to [place] for care next month. Andree Coss, RN

## 2018-06-16 NOTE — Telephone Encounter (Signed)
i'd be glad to keep him as a patient.  I can fill out the paperwork on Friday thanks

## 2018-06-17 ENCOUNTER — Ambulatory Visit (INDEPENDENT_AMBULATORY_CARE_PROVIDER_SITE_OTHER): Payer: 59 | Admitting: Endocrinology

## 2018-06-17 ENCOUNTER — Encounter: Payer: Self-pay | Admitting: Endocrinology

## 2018-06-17 VITALS — BP 110/74 | HR 84 | Ht 66.0 in | Wt 244.6 lb

## 2018-06-17 DIAGNOSIS — E782 Mixed hyperlipidemia: Secondary | ICD-10-CM

## 2018-06-17 DIAGNOSIS — E669 Obesity, unspecified: Secondary | ICD-10-CM | POA: Diagnosis not present

## 2018-06-17 DIAGNOSIS — E1169 Type 2 diabetes mellitus with other specified complication: Secondary | ICD-10-CM | POA: Diagnosis not present

## 2018-06-17 NOTE — Progress Notes (Signed)
Patient ID: Brett Walsh, male   DOB: 1965-08-26, 53 y.o.   MRN: [id number]           Reason for Appointment: Follow-up for Type 2 Diabetes   History of Present Illness:          Date of diagnosis of type 2 diabetes mellitus: 2012       Background history:   At the time of diagnosis he weighed about 268 pounds His blood sugars were markedly increased at that time when he was hospitalized for treatment and sent home on insulin, metformin and Avandia He says that however he was able to lose weight with improved diet and was able to get off medications and insulin  He has been on various treatment since a couple of years later but detailed records are not available, moved from [place] His A1c has been as high as 14.5 in 2017 when he was prescribed Basaglar and Humalog although he does not remember this Subsequently his blood sugar control has been quite variable He was first started on metformin in 06/2016 and the dose was progressively increased  Recent history:   INSULIN regimen is: 60 units TRESIBA daily.  Humalog 8 breakfast, 10 lunch and 6 at dinnertime   Non-insulin hypoglycemic drugs the patient is taking are: Invokana 100 mg daily  Most recent A1c is 6.9, previously 7.3 and as high as 13.8  Current history of diabetes, recent management, blood sugar patterns and problems identified:  His blood sugars are dramatically better and continues to improve on each visit  Invokana was increased up to 300 mg and he is doing well with this without any side effects or change in renal function  He thinks he is also cutting back on carbohydrates as before  Although he has not started any exercise he has not gained any weight over the holidays probably inconsistent with diet  He is still using the CGM and blood sugars are fairly stable throughout the day  Does have sporadic high readings after breakfast and lunch based on his diet and only rarely after dinner  He was told to  increase Humalog to 8 units at dinnertime but he is still taking 6  Also not adjusting the dose when he is eating out or eating larger meals or sweets like ice cream No hypoglycemia although blood sugars fasting at times are low normal       Side effects from medications have been: Diarrhea from high-dose metformin  Compliance with the medical regimen: Improving  Hypoglycemia: None   Glucose monitoring:  done several times a day         Glucometer:  One Touch ultra and recently freestyle libre  Blood Glucose readings by download   CGM use % of time  81  2-week average/SD  119  Time in range      90 %  % Time Above 180  8  % Time above 250   % Time Below 70 2     PRE-MEAL Fasting Lunch Dinner Bedtime Overall  Glucose range:       Averages:  108  127  118   119   POST-MEAL PC Breakfast PC Lunch PC Dinner  Glucose range:     Averages:  135  153  125   Previous readings:    AVERAGE for the last 2 weeks on the sensor 163 with 37% of readings above 180 and 63% in target  PRE-MEAL Fasting Lunch Dinner Bedtime Overall  Glucose  range:       Mean/median:  130  165  155  163   POST-MEAL PC Breakfast PC Lunch PC Dinner  Glucose range:     Mean/median:  144  174  205      Self-care: The diet that the patient has been following is: tries to limit sugar, may occasionally have fried food.      Usually eating breakfast at 5 AM and dinner at 7-8 PM                Dietician visit, most recent: Never               Exercise:  Trying to walk a little otherwise has sedentary job  Weight history:  Wt Readings from Last 3 Encounters:  06/17/18 244 lb 9.6 oz (110.9 kg)  06/11/18 242 lb (109.8 kg)  06/01/18 244 lb (110.7 kg)    Glycemic control:   Lab Results  Component Value Date   HGBA1C 6.9 (H) 06/15/2018   HGBA1C 7.3 (A) 03/16/2018   HGBA1C 7.5 (H) 11/26/2017   Lab Results  Component Value Date   MICROALBUR 1.5 09/28/2017   LDLCALC 77 01/27/2018   CREATININE 0.87  06/11/2018   Lab Results  Component Value Date   MICRALBCREAT 1.4 09/28/2017    Lab Results  Component Value Date   FRUCTOSAMINE 436 (H) 09/28/2017      Allergies as of 06/17/2018      Reactions   Metformin And Related    GI upset   Protonix [pantoprazole Sodium] Hives      Medication List       Accurate as of June 17, 2018  9:12 AM. Always use your most recent med list.        canagliflozin 300 MG Tabs tablet Commonly known as:  INVOKANA Take 1 tablet (300 mg total) by mouth daily before breakfast.   chlorhexidine 0.12 % solution Commonly known as:  PERIDEX Use as directed 15 mLs in the mouth or throat 2 (two) times daily.   FREESTYLE LIBRE 14 DAY SENSOR Misc USE 1 SENSOR EVERY 14 DAYS   glucose blood test strip Commonly known as:  ONE TOUCH ULTRA TEST Use one strip per test. Test blood sugars 1-4 times daily as instructed.   hydrOXYzine 25 MG tablet Commonly known as:  ATARAX/VISTARIL Take 1 tablet (25 mg total) by mouth every 6 (six) hours.   insulin lispro 100 UNIT/ML KiwkPen Commonly known as:  HUMALOG KWIKPEN INJECT 10 UNITS UNDER THE SKIN THREE TIMES DAILY   loratadine 10 MG tablet Commonly known as:  CLARITIN Take 10 mg by mouth daily as needed for allergies.   ondansetron 4 MG disintegrating tablet Commonly known as:  ZOFRAN ODT Take 1 tablet (4 mg total) by mouth every 8 (eight) hours as needed for nausea or vomiting.   ONE TOUCH SURESOFT Misc Use 1 lancet per test. Test blood sugars 1-4 times per day as instructed.   ONE TOUCH ULTRA MINI w/Device Kit Use meter to check blood sugars 1-4 times daily as instructed.   Pen Needles 31G X 8 MM Misc Use to inject insulin as needed.   rosuvastatin 5 MG tablet Commonly known as:  CRESTOR Take 1 tablet (5 mg total) by mouth daily.   TRESIBA FLEXTOUCH 200 UNIT/ML Sopn Generic drug:  Insulin Degludec INJECT 60 UNITS UNDER THE SKIN EVERY NIGHT AT BEDTIME   TRIUMEQ 600-50-300 MG  tablet Generic drug:  abacavir-dolutegravir-lamiVUDine TAKE 1 TABLET BY MOUTH  DAILY       Allergies:  Allergies  Allergen Reactions  . Metformin And Related     GI upset  . Protonix [Pantoprazole Sodium] Hives    Past Medical History:  Diagnosis Date  . Deviated septum   . Diabetes mellitus without complication ([place])   . HIV disease [facility])     Past Surgical History:  Procedure Laterality Date  . NASAL SEPTUM SURGERY  1981, [id number]    Family History  Problem Relation Age of Onset  . Diabetes Brother   . Diabetes Mother   . Diabetes Father   . Heart failure Father   . Diabetes Sister     Social History:  reports that he has been smoking cigarettes and e-cigarettes. He has smoked for the past 36.00 years. He has quit using smokeless tobacco. He reports current alcohol use of about 1.0 standard drinks of alcohol per week. He reports that he does not use drugs.   Review of Systems  Lipid history: Has never been on statin drugs LDL is higher than previously and also triglycerides.  However he thinks he had a relatively high fat soup the night before    Lab Results  Component Value Date   CHOL 162 06/15/2018   HDL 38.10 (L) 06/15/2018   LDLCALC 77 01/27/2018   LDLDIRECT 101.0 06/15/2018   TRIG 246.0 (H) 06/15/2018   CHOLHDL 4 06/15/2018           Hypertension: No history of this  BP Readings from Last 3 Encounters:  06/17/18 110/74  06/11/18 102/70  06/01/18 132/68    Most recent eye exam was in 10/18  Most recent foot exam: 08/2017    LABS:  Lab on 06/15/2018  Component Date Value Ref Range Status  . Hgb A1c MFr Bld 06/15/2018 6.9* 4.6 - 6.5 % Final   Glycemic Control Guidelines for People with Diabetes:Non Diabetic:  <6%Goal of Therapy: <7%Additional Action Suggested:  >8%   . Cholesterol 06/15/2018 162  0 - 200 mg/dL Final   ATP III Classification       Desirable:  < 200 mg/dL               Borderline High:  200 - 239 mg/dL          High:  > =  240 mg/dL  . Triglycerides 06/15/2018 246.0* 0.0 - 149.0 mg/dL Final   Normal:  <150 mg/dLBorderline High:  150 - 199 mg/dL  . HDL 06/15/2018 38.10* >39.00 mg/dL Final  . VLDL 06/15/2018 49.2* 0.0 - 40.0 mg/dL Final  . Total CHOL/HDL Ratio 06/15/2018 4   Final                  Men          Women1/2 Average Risk     3.4          3.3Average Risk          5.0          4.42X Average Risk          9.6          7.13X Average Risk          15.0          11.0                      . NonHDL 06/15/2018 123.50   Final   NOTE:  Non-HDL goal should be 30 mg/dL higher than  patient's LDL goal (i.e. LDL goal of < 70 mg/dL, would have non-HDL goal of < 100 mg/dL)  . Direct LDL 06/15/2018 101.0  mg/dL Final   Optimal:  <100 mg/dLNear or Above Optimal:  100-129 mg/dLBorderline High:  130-159 mg/dLHigh:  160-189 mg/dLVery High:  >190 mg/dL  Appointment on 06/11/2018  Component Date Value Ref Range Status  . PSA 06/11/2018 0.46  0.10 - 4.00 ng/mL Final   Test performed using Access Hybritech PSA Assay, a parmagnetic partical, chemiluminecent immunoassay.  . Sodium 06/11/2018 140  135 - 145 mEq/L Final  . Potassium 06/11/2018 4.0  3.5 - 5.1 mEq/L Final  . Chloride 06/11/2018 106  96 - 112 mEq/L Final  . CO2 06/11/2018 22  19 - 32 mEq/L Final  . Glucose, Bld 06/11/2018 93  70 - 99 mg/dL Final  . BUN 06/11/2018 24* 6 - 23 mg/dL Final  . Creatinine, Ser 06/11/2018 0.87  0.40 - 1.50 mg/dL Final  . Total Bilirubin 06/11/2018 0.5  0.2 - 1.2 mg/dL Final  . Alkaline Phosphatase 06/11/2018 68  39 - 117 U/L Final  . AST 06/11/2018 15  0 - 37 U/L Final  . ALT 06/11/2018 21  0 - 53 U/L Final  . Total Protein 06/11/2018 7.3  6.0 - 8.3 g/dL Final  . Albumin 06/11/2018 4.2  3.5 - 5.2 g/dL Final  . Calcium 06/11/2018 9.2  8.4 - 10.5 mg/dL Final  . GFR 06/11/2018 98.53  >60.00 mL/min Final  . WBC 06/11/2018 12.2* 4.0 - 10.5 K/uL Final  . RBC 06/11/2018 4.29  4.22 - 5.81 Mil/uL Final  . Platelets 06/11/2018 294.0  150.0 -  400.0 K/uL Final  . Hemoglobin 06/11/2018 13.6  13.0 - 17.0 g/dL Final  . HCT 06/11/2018 39.9  39.0 - 52.0 % Final  . MCV 06/11/2018 93.1  78.0 - 100.0 fl Final  . MCHC 06/11/2018 34.1  30.0 - 36.0 g/dL Final  . RDW 06/11/2018 14.0  11.5 - 15.5 % Final    Physical Examination:  BP 110/74 (BP Location: Left Arm, Patient Position: Sitting, Cuff Size: Normal)   Pulse 84   Ht 5' 6"  (1.676 m)   Wt 244 lb 9.6 oz (110.9 kg)   SpO2 96%   BMI 39.48 kg/m          ASSESSMENT:  Diabetes type 2, with recent BMI about 39  See history of present illness for detailed discussion of current diabetes management, blood sugar patterns and problems identified  He is insulin-dependent  His blood sugars have been improved tremendously A1c is 6.9  He has benefited from higher doses of Invokana as well as improve diet However has not lost weight and may benefit from starting exercise which he has not done so Also his fasting readings appear to be low normal now Over the last few months with using basal bolus insulin regimen and Invokana He is not adjusting his mealtime dose based on what he is eating but only has occasional postprandial spikes  LIPIDS: Somewhat worse than usual but also may consider statin protection for his cardiovascular risk with having diabetes Triglycerides have usually not been high but will need to recheck  PLAN:    Reduce Tresiba by 4 units for now and may need to reduce further later  Increase mealtime dose by 2 to 4 units when eating larger meals or eating out with low carbohydrates or sweet  Try to take insulin before eating, he is currently taking it 5 to 10 minutes  after eating  Start walking for exercise  Discussed blood sugar targets after meals  To call if having higher or lower blood sugars consistently  Recheck lipids and consider statin drug at that time   Patient Instructions  Tresiba 56   Exercise daily    Counseling time on subjects  discussed in assessment and plan sections is over 50% of today's 25 minute visit      Elayne Snare 06/17/2018, 9:12 AM   Note: This office note was prepared with Dragon voice recognition system technology. Any transcriptional errors that result from this process are unintentional.

## 2018-06-17 NOTE — Patient Instructions (Addendum)
Brett Walsh 58   Exercise daily

## 2018-06-21 ENCOUNTER — Telehealth: Payer: Self-pay | Admitting: *Deleted

## 2018-06-25 NOTE — Telephone Encounter (Signed)
FMLA paperwork completed and faxed to employer per patient's request.  Copy made for his chart, original up front for patient to pick up. Andree Coss, RN

## 2018-07-01 ENCOUNTER — Other Ambulatory Visit: Payer: Self-pay | Admitting: Nurse Practitioner

## 2018-07-08 ENCOUNTER — Other Ambulatory Visit: Payer: Self-pay | Admitting: Endocrinology

## 2018-07-08 ENCOUNTER — Encounter: Payer: Self-pay | Admitting: Gastroenterology

## 2018-07-10 ENCOUNTER — Other Ambulatory Visit: Payer: Self-pay | Admitting: Endocrinology

## 2018-07-21 ENCOUNTER — Other Ambulatory Visit (HOSPITAL_COMMUNITY)
Admission: RE | Admit: 2018-07-21 | Discharge: 2018-07-21 | Disposition: A | Discharge status: Home / Self Care | Payer: 59 | Source: Ambulatory Visit | Attending: Infectious Diseases | Admitting: Infectious Diseases

## 2018-07-21 ENCOUNTER — Other Ambulatory Visit: Payer: 59

## 2018-07-21 DIAGNOSIS — Z113 Encounter for screening for infections with a predominantly sexual mode of transmission: Secondary | ICD-10-CM

## 2018-07-21 DIAGNOSIS — Z79899 Other long term (current) drug therapy: Secondary | ICD-10-CM

## 2018-07-21 DIAGNOSIS — B20 Human immunodeficiency virus [HIV] disease: Secondary | ICD-10-CM

## 2018-07-22 LAB — T-HELPER CELL (CD4) - (RCID CLINIC ONLY)
CD4 % Helper T Cell: 12 % — ABNORMAL LOW (ref 33–55)
CD4 T Cell Abs: 290 /uL — ABNORMAL LOW (ref 400–2700)

## 2018-07-22 LAB — URINE CYTOLOGY ANCILLARY ONLY
Chlamydia: NEGATIVE
Neisseria Gonorrhea: NEGATIVE

## 2018-07-23 LAB — COMPREHENSIVE METABOLIC PANEL
AG RATIO: 1.3 (calc) (ref 1.0–2.5)
ALBUMIN MSPROF: 4.1 g/dL (ref 3.6–5.1)
ALT: 11 U/L (ref 9–46)
AST: 14 U/L (ref 10–35)
Alkaline phosphatase (APISO): 68 U/L (ref 35–144)
BUN: 14 mg/dL (ref 7–25)
CHLORIDE: 107 mmol/L (ref 98–110)
CO2: 24 mmol/L (ref 20–32)
CREATININE: 0.9 mg/dL (ref 0.70–1.33)
Calcium: 8.9 mg/dL (ref 8.6–10.3)
GLOBULIN: 3.1 g/dL (ref 1.9–3.7)
Glucose, Bld: 181 mg/dL — ABNORMAL HIGH (ref 65–99)
POTASSIUM: 3.8 mmol/L (ref 3.5–5.3)
SODIUM: 140 mmol/L (ref 135–146)
Total Bilirubin: 0.3 mg/dL (ref 0.2–1.2)
Total Protein: 7.2 g/dL (ref 6.1–8.1)

## 2018-07-23 LAB — CBC
HEMATOCRIT: 37.4 % — AB (ref 38.5–50.0)
Hemoglobin: 12.7 g/dL — ABNORMAL LOW (ref 13.2–17.1)
MCH: 31.6 pg (ref 27.0–33.0)
MCHC: 34 g/dL (ref 32.0–36.0)
MCV: 93 fL (ref 80.0–100.0)
MPV: 11.1 fL (ref 7.5–12.5)
Platelets: 313 10*3/uL (ref 140–400)
RBC: 4.02 10*6/uL — ABNORMAL LOW (ref 4.20–5.80)
RDW: 13.1 % (ref 11.0–15.0)
WBC: 11.6 10*3/uL — ABNORMAL HIGH (ref 3.8–10.8)

## 2018-07-23 LAB — HIV-1 RNA QUANT-NO REFLEX-BLD
HIV 1 RNA QUANT: DETECTED {copies}/mL — AB
HIV-1 RNA Quant, Log: <1.3 Log copies/mL — AB

## 2018-07-23 LAB — LIPID PANEL
Cholesterol: 118 mg/dL (ref <200)
HDL: 38 mg/dL — ABNORMAL LOW (ref >=40)
LDL Cholesterol (Calc): 61 mg/dL (calc)
NON-HDL CHOLESTEROL (CALC): 80 mg/dL (ref <130)
Total CHOL/HDL Ratio: 3.1 (calc) (ref <5.0)
Triglycerides: 107 mg/dL (ref <150)

## 2018-07-23 LAB — RPR: RPR Ser Ql: NONREACTIVE

## 2018-08-03 ENCOUNTER — Encounter: Payer: Self-pay | Admitting: Family

## 2018-08-03 ENCOUNTER — Ambulatory Visit: Payer: 59 | Admitting: Family

## 2018-08-03 VITALS — BP 130/78 | HR 98 | Temp 98.5°F | Ht 66.0 in | Wt 247.0 lb

## 2018-08-03 DIAGNOSIS — B9789 Other viral agents as the cause of diseases classified elsewhere: Secondary | ICD-10-CM | POA: Diagnosis not present

## 2018-08-03 DIAGNOSIS — J069 Acute upper respiratory infection, unspecified: Secondary | ICD-10-CM

## 2018-08-03 MED ORDER — TRIAMCINOLONE ACETONIDE 55 MCG/ACT NA AERO: 2.0000 | INHALATION_SPRAY | Freq: Every day | NASAL | 12 refills | Status: DC

## 2018-08-03 MED ORDER — LORATADINE 10 MG PO TABS: 10.0000 mg | ORAL_TABLET | Freq: Every day | ORAL | 11 refills | Status: AC | PRN

## 2018-08-03 NOTE — Patient Instructions (Signed)
Try saline rinse or Nasacort AQ and re-start Claritin;

## 2018-08-03 NOTE — Progress Notes (Signed)
Brett Walsh is a 53 y.o. male with the following history as recorded in EpicCare:  Patient Active Problem List   Diagnosis Date Noted  . Mixed hyperlipidemia 06/11/2018  . Viral gastroenteritis 06/01/2018  . Morbid obesity ([place]) 02/02/2018  . Hyperlipidemia associated with type 2 diabetes mellitus ([place]) 02/02/2018  . Acute non intractable tension-type headache 11/12/2017  . Contusion of face 02/06/2017  . GERD (gastroesophageal reflux disease) 12/02/2016  . Medication reaction 10/14/2016  . Depression (emotion) 07/23/2016  . Insomnia 07/23/2016  . Routine general medical examination at a health care facility 06/20/2016  . TMJ (temporomandibular joint syndrome) 01/21/2016  . Slow transit constipation 08/16/2014  . HIV disease ([place]) 06/20/2014  . Diabetes type 2, uncontrolled ([place]) 06/20/2014    Current Outpatient Medications  Medication Sig Dispense Refill  . Blood Glucose Monitoring Suppl (ONE TOUCH ULTRA MINI) w/Device KIT Use meter to check blood sugars 1-4 times daily as instructed. 1 each 0  . chlorhexidine (PERIDEX) 0.12 % solution Use as directed 15 mLs in the mouth or throat 2 (two) times daily.    . Continuous Blood Gluc Sensor (FREESTYLE LIBRE 14 DAY SENSOR) MISC USE 1 SENSOR EVERY 14 DAYS 2 each 2  . glucose blood (ONE TOUCH ULTRA TEST) test strip Use one strip per test. Test blood sugars 1-4 times daily as instructed. 100 each 12  . hydrOXYzine (ATARAX/VISTARIL) 25 MG tablet Take 1 tablet (25 mg total) by mouth every 6 (six) hours. 20 tablet 0  . insulin lispro (HUMALOG KWIKPEN) 100 UNIT/ML KiwkPen INJECT 10 UNITS UNDER THE SKIN THREE TIMES DAILY 9 mL 0  . Insulin Pen Needle (B-D ULTRAFINE III SHORT PEN) 31G X 8 MM MISC USE AS DIRECTED AS NEEDED 100 each 5  . INVOKANA 300 MG TABS tablet TAKE 1 TABLET(300 MG) BY MOUTH DAILY BEFORE BREAKFAST 30 tablet 3  . Lancets Misc. (ONE TOUCH SURESOFT) MISC Use 1 lancet per test. Test blood sugars 1-4 times per day as instructed. 1  each 1  . loratadine (CLARITIN) 10 MG tablet Take 1 tablet (10 mg total) by mouth daily as needed for allergies. 30 tablet 11  . ondansetron (ZOFRAN ODT) 4 MG disintegrating tablet Take 1 tablet (4 mg total) by mouth every 8 (eight) hours as needed for nausea or vomiting. 20 tablet 0  . rosuvastatin (CRESTOR) 5 MG tablet Take 1 tablet (5 mg total) by mouth daily. 90 tablet 3  . TRESIBA FLEXTOUCH 200 UNIT/ML SOPN INJECT 60 UNITS UNDER THE SKIN EVERY NIGHT AT BEDTIME 9 pen 2  . TRIUMEQ 600-50-300 MG tablet TAKE 1 TABLET BY MOUTH DAILY 90 tablet 1  . triamcinolone (NASACORT) 55 MCG/ACT AERO nasal inhaler Place 2 sprays into the nose daily. 1 Inhaler 12   No current facility-administered medications for this visit.     Allergies: Metformin and related and Protonix [pantoprazole sodium]  Past Medical History:  Diagnosis Date  . Deviated septum   . Diabetes mellitus without complication ([place])   . HIV disease [facility])     Past Surgical History:  Procedure Laterality Date  . NASAL SEPTUM SURGERY  1981, [id number]    Family History  Problem Relation Age of Onset  . Diabetes Brother   . Diabetes Mother   . Diabetes Father   . Heart failure Father   . Diabetes Sister     Social History   Tobacco Use  . Smoking status: Light Tobacco Smoker    Years: 36.00    Types: Cigarettes, E-cigarettes  .  Smokeless tobacco: Former Systems developer  . Tobacco comment: 3-4/day  Substance Use Topics  . Alcohol use: Yes    Alcohol/week: 1.0 standard drinks    Types: 1 Standard drinks or equivalent per week    Comment: socially    Subjective:  Patient presents with concerns for left ear pain/ sore throat; symptoms x 2 days; no fever, no chest pain; feels like drainage in left side of throat;  Diabetes control is excellent- Hgba1c at 6.9; HIV disease is controlled;      Objective:  Vitals:   08/03/18 1002  BP: 130/78  Pulse: 98  Temp: 98.5 F (36.9 C)  TempSrc: Oral  SpO2: 100%  Weight: 247 lb (112 kg)   Height: 5' 6"  (1.676 m)    General: Well developed, well nourished, in no acute distress  Skin : Warm and dry.  Head: Normocephalic and atraumatic  Eyes: Sclera and conjunctiva clear; pupils round and reactive to light; extraocular movements intact  Ears: External normal; canals clear; tympanic membranes congested bilaterally  Oropharynx: Pink, supple. No suspicious lesions  Neck: Supple without thyromegaly, adenopathy  Lungs: Respirations unlabored; clear to auscultation bilaterally without wheeze, rales, rhonchi  CVS exam: normal rate and regular rhythm.  Neurologic: Alert and oriented; speech intact; face symmetrical; moves all extremities well; CNII-XII intact without focal deficit   Assessment:  1. Viral URI with cough     Plan:  Rapid flu and strep are both negative; suspect viral URI or allergies; symptomatic treatment discussed- Rx for Claritin and Nasacort AQ; increase fluids, rest and follow-up worse, no better. Work note given as requested.   No follow-ups on file.  No orders of the defined types were placed in this encounter.   Requested Prescriptions   Signed Prescriptions Disp Refills  . loratadine (CLARITIN) 10 MG tablet 30 tablet 11    Sig: Take 1 tablet (10 mg total) by mouth daily as needed for allergies.  Marland Kitchen triamcinolone (NASACORT) 55 MCG/ACT AERO nasal inhaler 1 Inhaler 12    Sig: Place 2 sprays into the nose daily.

## 2018-08-04 ENCOUNTER — Encounter: Payer: Self-pay | Admitting: Infectious Diseases

## 2018-08-04 ENCOUNTER — Ambulatory Visit: Payer: 59 | Admitting: Infectious Diseases

## 2018-08-04 ENCOUNTER — Encounter: Payer: 59 | Admitting: Infectious Diseases

## 2018-08-04 VITALS — BP 121/79 | HR 80 | Temp 98.1°F | Wt 247.0 lb

## 2018-08-04 DIAGNOSIS — B20 Human immunodeficiency virus [HIV] disease: Secondary | ICD-10-CM | POA: Diagnosis not present

## 2018-08-04 DIAGNOSIS — Z113 Encounter for screening for infections with a predominantly sexual mode of transmission: Secondary | ICD-10-CM

## 2018-08-04 DIAGNOSIS — E1169 Type 2 diabetes mellitus with other specified complication: Secondary | ICD-10-CM | POA: Diagnosis not present

## 2018-08-04 DIAGNOSIS — E11649 Type 2 diabetes mellitus with hypoglycemia without coma: Secondary | ICD-10-CM | POA: Diagnosis not present

## 2018-08-04 DIAGNOSIS — Z79899 Other long term (current) drug therapy: Secondary | ICD-10-CM

## 2018-08-04 DIAGNOSIS — E785 Hyperlipidemia, unspecified: Secondary | ICD-10-CM

## 2018-08-04 NOTE — Progress Notes (Signed)
   Subjective:    Patient ID: Brett Walsh, male    DOB: [birth year], 53 y.o.   MRN: [id number]  HPI 53yo M with hx of HIV+ since 2008 (when he was hospitalized with pneumonia), on tivicay-descovy.  (was on atripla prior, developed resistance), also hx of DM2 (2012) on metformin.  He was seen in f/u in ID clinic on 1-19-16and started on triumeq. States his FSG have been undercontrol- last A1C 6.9% (06-15-18).  Has implantable DM monitor which he really likes.   Has been taking his ART without issue.  No problems with his DM. No problems with invokana.   HIV 1 RNA Quant (copies/mL)  Date Value  07/21/2018 <20 DETECTED (A)  01/27/2018 <20 DETECTED (A)  02/17/2017 <20 DETECTED (A)   CD4 T Cell Abs (/uL)  Date Value  07/21/2018 290 (L)  01/27/2018 360 (L)  02/17/2017 380 (L)    Review of Systems  Constitutional: Negative for appetite change and unexpected weight change.  Gastrointestinal: Negative for constipation and diarrhea.  Genitourinary: Negative for difficulty urinating.  Neurological: Negative for numbness.  Psychiatric/Behavioral: Negative for sleep disturbance.  last ophtho 2018- next visit 5 yrs from retinal specialist. Has seen "regular eye doctor" told he has very small cataracts.  Wants to lose wt, starting to walk in evenings.  Has colon sched for 08-24-18.  Please see HPI. All other systems reviewed and negative.     Objective:   Physical Exam Constitutional:      Appearance: Normal appearance.  HENT:     Mouth/Throat:     Mouth: Mucous membranes are moist.     Pharynx: No oropharyngeal exudate.  Eyes:     Extraocular Movements: Extraocular movements intact.     Pupils: Pupils are equal, round, and reactive to light.  Neck:     Musculoskeletal: Normal range of motion and neck supple.  Cardiovascular:     Rate and Rhythm: Normal rate and regular rhythm.  Pulmonary:     Effort: Pulmonary effort is normal.     Breath sounds: Normal breath sounds.    Abdominal:     General: Bowel sounds are normal. There is no distension.     Palpations: Abdomen is soft. There is no mass.  Musculoskeletal: Normal range of motion.  Neurological:     General: No focal deficit present.     Mental Status: He is alert.  Psychiatric:        Mood and Affect: Mood normal.       Assessment & Plan:

## 2018-08-04 NOTE — Assessment & Plan Note (Signed)
He is doing very well vax are up to date He is doing well on his ART, has mild dip in CD4 but remains <20 rtc in 9 months with labs prior. Offered/refused condoms.

## 2018-08-04 NOTE — Assessment & Plan Note (Signed)
He is going to work on diet and exercise.

## 2018-08-04 NOTE — Assessment & Plan Note (Signed)
Much better on rosuvastatin

## 2018-08-04 NOTE — Assessment & Plan Note (Signed)
He is doing very well  Has been prompt with all his f/u appts.

## 2018-08-10 ENCOUNTER — Encounter: Payer: Self-pay | Admitting: Gastroenterology

## 2018-08-10 ENCOUNTER — Other Ambulatory Visit: Payer: Self-pay

## 2018-08-10 ENCOUNTER — Ambulatory Visit (AMBULATORY_SURGERY_CENTER): Payer: Self-pay

## 2018-08-10 VITALS — Ht 66.5 in | Wt 247.0 lb

## 2018-08-10 DIAGNOSIS — Z1211 Encounter for screening for malignant neoplasm of colon: Secondary | ICD-10-CM

## 2018-08-10 MED ORDER — PEG 3350-KCL-NA BICARB-NACL 420 G PO SOLR: 4000.0000 mL | Freq: Once | ORAL | 0 refills | Status: AC

## 2018-08-10 NOTE — Progress Notes (Signed)
No egg or soy allergy known to patient  No issues with past sedation with any surgeries  or procedures, no intubation problems  No diet pills per patient No home 02 use per patient  No blood thinners per patient  Pt denies issues with constipation  No A fib or A flutter  EMMI video sent to pt's e mail  

## 2018-08-24 ENCOUNTER — Encounter: Payer: 59 | Admitting: Gastroenterology

## 2018-09-14 ENCOUNTER — Other Ambulatory Visit: Payer: Self-pay

## 2018-09-14 ENCOUNTER — Other Ambulatory Visit (INDEPENDENT_AMBULATORY_CARE_PROVIDER_SITE_OTHER): Payer: 59

## 2018-09-14 DIAGNOSIS — Z794 Long term (current) use of insulin: Secondary | ICD-10-CM

## 2018-09-14 DIAGNOSIS — E1169 Type 2 diabetes mellitus with other specified complication: Secondary | ICD-10-CM

## 2018-09-14 DIAGNOSIS — E669 Obesity, unspecified: Secondary | ICD-10-CM

## 2018-09-14 DIAGNOSIS — E782 Mixed hyperlipidemia: Secondary | ICD-10-CM

## 2018-09-14 DIAGNOSIS — E1165 Type 2 diabetes mellitus with hyperglycemia: Secondary | ICD-10-CM

## 2018-09-14 LAB — COMPREHENSIVE METABOLIC PANEL
ALT: 14 U/L (ref 0–53)
AST: 14 U/L (ref 0–37)
Albumin: 4.1 g/dL (ref 3.5–5.2)
Alkaline Phosphatase: 65 U/L (ref 39–117)
BUN: 19 mg/dL (ref 6–23)
CO2: 26 mEq/L (ref 19–32)
Calcium: 9 mg/dL (ref 8.4–10.5)
Chloride: 105 mEq/L (ref 96–112)
Creatinine, Ser: 0.91 mg/dL (ref 0.40–1.50)
GFR: 87.93 mL/min (ref >60.00)
Glucose, Bld: 136 mg/dL — ABNORMAL HIGH (ref 70–99)
Potassium: 4.1 mEq/L (ref 3.5–5.1)
Sodium: 139 mEq/L (ref 135–145)
Total Bilirubin: 0.3 mg/dL (ref 0.2–1.2)
Total Protein: 7.7 g/dL (ref 6.0–8.3)

## 2018-09-14 LAB — LIPID PANEL
Cholesterol: 109 mg/dL (ref 0–200)
HDL: 34.7 mg/dL — ABNORMAL LOW (ref >39.00)
LDL Cholesterol: 53 mg/dL (ref 0–99)
NonHDL: 74.37
Total CHOL/HDL Ratio: 3
Triglycerides: 108 mg/dL (ref 0.0–149.0)
VLDL: 21.6 mg/dL (ref 0.0–40.0)

## 2018-09-14 LAB — HEMOGLOBIN A1C: Hgb A1c MFr Bld: 7 % — ABNORMAL HIGH (ref 4.6–6.5)

## 2018-09-16 ENCOUNTER — Encounter: Payer: 59 | Admitting: Endocrinology

## 2018-09-16 ENCOUNTER — Other Ambulatory Visit: Payer: Self-pay

## 2018-09-16 NOTE — Progress Notes (Signed)
This encounter was created in error - please disregard.

## 2018-10-01 DIAGNOSIS — K8020 Calculus of gallbladder without cholecystitis without obstruction: Secondary | ICD-10-CM

## 2018-10-01 HISTORY — DX: Calculus of gallbladder without cholecystitis without obstruction: K80.20

## 2018-10-05 ENCOUNTER — Telehealth: Payer: Self-pay | Admitting: *Deleted

## 2018-10-05 NOTE — Telephone Encounter (Signed)
Ok to schedule colonoscopy per Dr. Meridee Score.  Spoke with pt and he needs a Monday appointment.  I will call him to schedule this as soon as June's schedule is available.

## 2018-10-14 ENCOUNTER — Other Ambulatory Visit: Payer: Self-pay | Admitting: Endocrinology

## 2018-10-14 ENCOUNTER — Other Ambulatory Visit: Payer: Self-pay | Admitting: Infectious Diseases

## 2018-10-14 DIAGNOSIS — B20 Human immunodeficiency virus [HIV] disease: Secondary | ICD-10-CM

## 2018-10-15 ENCOUNTER — Encounter: Payer: Self-pay | Admitting: Family

## 2018-10-15 ENCOUNTER — Ambulatory Visit (INDEPENDENT_AMBULATORY_CARE_PROVIDER_SITE_OTHER): Payer: 59 | Admitting: Family

## 2018-10-15 DIAGNOSIS — R112 Nausea with vomiting, unspecified: Secondary | ICD-10-CM

## 2018-10-15 DIAGNOSIS — K2100 Gastro-esophageal reflux disease with esophagitis, without bleeding: Secondary | ICD-10-CM

## 2018-10-15 MED ORDER — FAMOTIDINE 20 MG PO TABS: 20.0000 mg | ORAL_TABLET | Freq: Two times a day (BID) | ORAL | 1 refills | Status: DC

## 2018-10-15 NOTE — Progress Notes (Signed)
Brett Walsh is a 53 y.o. male with the following history as recorded in EpicCare:  Patient Active Problem List   Diagnosis Date Noted  . Mixed hyperlipidemia 06/11/2018  . Viral gastroenteritis 06/01/2018  . Morbid obesity ([place]) 02/02/2018  . Hyperlipidemia associated with type 2 diabetes mellitus ([place]) 02/02/2018  . Acute non intractable tension-type headache 11/12/2017  . Contusion of face 02/06/2017  . GERD (gastroesophageal reflux disease) 12/02/2016  . Medication reaction 10/14/2016  . Depression (emotion) 07/23/2016  . Insomnia 07/23/2016  . Routine general medical examination at a health care facility 06/20/2016  . TMJ (temporomandibular joint syndrome) 01/21/2016  . Slow transit constipation 08/16/2014  . HIV disease ([place]) 06/20/2014  . Diabetes type 2, uncontrolled ([place]) 06/20/2014    Current Outpatient Medications  Medication Sig Dispense Refill  . Blood Glucose Monitoring Suppl (ONE TOUCH ULTRA MINI) w/Device KIT Use meter to check blood sugars 1-4 times daily as instructed. 1 each 0  . chlorhexidine (PERIDEX) 0.12 % solution Use as directed 15 mLs in the mouth or throat 2 (two) times daily.    . Continuous Blood Gluc Sensor (FREESTYLE LIBRE 14 DAY SENSOR) MISC USE 1 SENSOR EVERY 14 DAYS 2 each 2  . famotidine (PEPCID) 20 MG tablet Take 1 tablet (20 mg total) by mouth 2 (two) times daily. 60 tablet 1  . glucose blood (ONE TOUCH ULTRA TEST) test strip Use one strip per test. Test blood sugars 1-4 times daily as instructed. 100 each 12  . insulin lispro (HUMALOG KWIKPEN) 100 UNIT/ML KiwkPen INJECT 10 UNITS UNDER THE SKIN THREE TIMES DAILY (Patient taking differently: 5 Units. INJECT 5 UNITS UNDER THE SKIN THREE TIMES DAILY) 9 mL 0  . Insulin Pen Needle (B-D ULTRAFINE III SHORT PEN) 31G X 8 MM MISC USE AS DIRECTED AS NEEDED 100 each 5  . INVOKANA 300 MG TABS tablet TAKE 1 TABLET(300 MG) BY MOUTH DAILY BEFORE BREAKFAST 30 tablet 3  . Lancets Misc. (ONE TOUCH SURESOFT) MISC  Use 1 lancet per test. Test blood sugars 1-4 times per day as instructed. 1 each 1  . loratadine (CLARITIN) 10 MG tablet Take 1 tablet (10 mg total) by mouth daily as needed for allergies. 30 tablet 11  . rosuvastatin (CRESTOR) 5 MG tablet Take 1 tablet (5 mg total) by mouth daily. 90 tablet 3  . TRESIBA FLEXTOUCH 200 UNIT/ML SOPN INJECT 60 UNITS UNDER THE SKIN EVERY NIGHT AT BEDTIME (Patient taking differently: 50 Units. Every am) 9 pen 2  . triamcinolone (NASACORT) 55 MCG/ACT AERO nasal inhaler Place 2 sprays into the nose daily. 1 Inhaler 12  . TRIUMEQ 600-50-300 MG tablet TAKE 1 TABLET BY MOUTH DAILY 90 tablet 1   No current facility-administered medications for this visit.     Allergies: Metformin and related and Protonix [pantoprazole sodium]  Past Medical History:  Diagnosis Date  . Cataract   . Deviated septum   . Diabetes mellitus without complication ([place])   . HIV disease [facility])     Past Surgical History:  Procedure Laterality Date  . NASAL SEPTUM SURGERY  1981, [id number]    Family History  Problem Relation Age of Onset  . Diabetes Brother   . Diabetes Mother   . Diabetes Father   . Heart failure Father   . Diabetes Sister   . Colon cancer Neg Hx   . Esophageal cancer Neg Hx   . Rectal cancer Neg Hx   . Stomach cancer Neg Hx     Social History  Tobacco Use  . Smoking status: Light Tobacco Smoker    Years: 36.00    Types: Cigarettes, E-cigarettes  . Smokeless tobacco: Former Systems developer  . Tobacco comment: 3-4/day  Substance Use Topics  . Alcohol use: Yes    Alcohol/week: 1.0 standard drinks    Types: 1 Standard drinks or equivalent per week    Comment: socially    Subjective:    I connected with Brett Walsh on 10/15/18 at  2:20 PM EDT by a video enabled telemedicine application and verified that I am speaking with the correct person using two identifiers.   I discussed the limitations of evaluation and management by telemedicine and the availability of in  person appointments. The patient expressed understanding and agreed to proceed.  Patient notes that he experienced episode of vomiting on Monday- sudden onset; notes he stayed home and rested on Tuesday and started to feel better; was symptom free on Wednesday and Thursday and actually "felt really good." Became concerned when he threw up again today- feels like he is tasting acid in the back of his throat; denies any fever, abdominal pain; able to keep fluids down; has been experiencing increased burping and belching recently; has tried Protonix in the past for similar symptoms but developed hives; no coffee grounds emesis; in discussing history, admits he may have had at least 20 of these type of episodes in the past few years; with this particular recurrence, states that blood sugar has remained very well controlled; blood sugar today was at 126;     Objective:  There were no vitals filed for this visit.  General: Well developed, well nourished, in no acute distress  Head: Normocephalic and atraumatic  Lungs: Respirations unlabored;  Neurologic: Alert and oriented; speech intact; face symmetrical;   Assessment:  1. Nausea and vomiting, intractability of vomiting not specified, unspecified vomiting type   2. Gastroesophageal reflux disease with esophagitis     Plan:  Strict ER precautions for upcoming weekend; trial of Pepcid 20 mg bid; will update abdominal ultrasound; refer to GI to discuss adding endoscopy with upcoming colonoscopy; follow-up to be determined.   No follow-ups on file.  Orders Placed This Encounter  Procedures  . US Abdomen Complete    Standing Status:   Future    Standing Expiration Date:   12/15/2019    Order Specific Question:   Reason for Exam (SYMPTOM  OR DIAGNOSIS REQUIRED)    Answer:   recurrent nausea and vomiting    Order Specific Question:   Preferred imaging location?    Answer:   [facility]  . Ambulatory referral to Gastroenterology     Referral Priority:   Routine    Referral Type:   Consultation    Referral Reason:   Specialty Services Required    Number of Visits Requested:   1    Requested Prescriptions   Signed Prescriptions Disp Refills  . famotidine (PEPCID) 20 MG tablet 60 tablet 1    Sig: Take 1 tablet (20 mg total) by mouth 2 (two) times daily.

## 2018-11-04 ENCOUNTER — Ambulatory Visit: Payer: 59 | Admitting: Gastroenterology

## 2018-11-07 ENCOUNTER — Other Ambulatory Visit: Payer: Self-pay | Admitting: Endocrinology

## 2018-11-11 ENCOUNTER — Ambulatory Visit
Admission: RE | Admit: 2018-11-11 | Discharge: 2018-11-11 | Disposition: A | Discharge status: Home / Self Care | Payer: 59 | Source: Ambulatory Visit | Attending: Family | Admitting: Family

## 2018-11-11 DIAGNOSIS — R112 Nausea with vomiting, unspecified: Secondary | ICD-10-CM

## 2018-11-12 ENCOUNTER — Other Ambulatory Visit: Payer: Self-pay | Admitting: Family

## 2018-11-12 DIAGNOSIS — K8020 Calculus of gallbladder without cholecystitis without obstruction: Secondary | ICD-10-CM

## 2018-11-16 ENCOUNTER — Telehealth: Payer: Self-pay

## 2018-11-16 NOTE — Progress Notes (Signed)
Pt has called back, quite miserable and wanted to see if someone could be prompted to get this scheduled as he is not feeling well at all and has not heard anything.

## 2018-11-16 NOTE — Progress Notes (Signed)
Please advise 

## 2018-11-16 NOTE — Telephone Encounter (Signed)
Message sent to [facility] to follow up on request.

## 2018-11-16 NOTE — Progress Notes (Signed)
Referral has been sent to [facility] and pt is aware °

## 2018-11-23 ENCOUNTER — Ambulatory Visit: Payer: Self-pay | Admitting: Surgery

## 2018-11-23 NOTE — H&P (Signed)
Surgical H&P  CC: nausea  HPI: this is a very nice man who has been struggling with postprandial nausea for several years.  He has not really had abdominal pain per se, but states that specifically after eating greasy or fatty foods he has severe queasiness and nausea but inability to vomit.  He also endorses diarrhea.  Had an ultrasound that does confirm gallstones.  He is going to see gastroenterology next week about getting an upper and lower endoscopy.  He has never had any prior abdominal surgeries.  Allergies  Allergen Reactions  . Metformin And Related     GI upset  . Protonix [Pantoprazole Sodium] Hives    Past Medical History:  Diagnosis Date  . Cataract   . Deviated septum   . Diabetes mellitus without complication ([place])   . HIV disease [facility])     Past Surgical History:  Procedure Laterality Date  . NASAL SEPTUM SURGERY  1981, [id number]    Family History  Problem Relation Age of Onset  . Diabetes Brother   . Diabetes Mother   . Diabetes Father   . Heart failure Father   . Diabetes Sister   . Colon cancer Neg Hx   . Esophageal cancer Neg Hx   . Rectal cancer Neg Hx   . Stomach cancer Neg Hx     Social History   Socioeconomic History  . Marital status: Single    Spouse name: Not on file  . Number of children: 0  . Years of education: 64  . Highest education level: Not on file  Occupational History  . Occupation: Financial risk analyst  Social Needs  . Financial resource strain: Not on file  . Food insecurity    Worry: Not on file    Inability: Not on file  . Transportation needs    Medical: Not on file    Non-medical: Not on file  Tobacco Use  . Smoking status: Light Tobacco Smoker    Years: 36.00    Types: Cigarettes, E-cigarettes  . Smokeless tobacco: Former Systems developer  . Tobacco comment: 3-4/day  Substance and Sexual Activity  . Alcohol use: Yes    Alcohol/week: 1.0 standard drinks    Types: 1 Standard drinks or equivalent per week   Comment: socially  . Drug use: No    Types: Hydromorphone  . Sexual activity: Never    Partners: Male    Comment: pt. declined condoms  Lifestyle  . Physical activity    Days per week: Not on file    Minutes per session: Not on file  . Stress: Not on file  Relationships  . Social Herbalist on phone: Not on file    Gets together: Not on file    Attends religious service: Not on file    Active member of club or organization: Not on file    Attends meetings of clubs or organizations: Not on file    Relationship status: Not on file  Other Topics Concern  . Not on file  Social History Narrative   Fun: Watch TV, veg at home.     Current Outpatient Medications on File Prior to Visit  Medication Sig Dispense Refill  . Blood Glucose Monitoring Suppl (ONE TOUCH ULTRA MINI) w/Device KIT Use meter to check blood sugars 1-4 times daily as instructed. 1 each 0  . chlorhexidine (PERIDEX) 0.12 % solution Use as directed 15 mLs in the mouth or throat 2 (two) times daily.    Marland Kitchen  Continuous Blood Gluc Sensor (FREESTYLE LIBRE 14 DAY SENSOR) MISC USE 1 SENSOR EVERY 14 DAYS 2 each 2  . famotidine (PEPCID) 20 MG tablet Take 1 tablet (20 mg total) by mouth 2 (two) times daily. 60 tablet 1  . glucose blood (ONE TOUCH ULTRA TEST) test strip Use one strip per test. Test blood sugars 1-4 times daily as instructed. 100 each 12  . insulin lispro (HUMALOG KWIKPEN) 100 UNIT/ML KiwkPen INJECT 10 UNITS UNDER THE SKIN THREE TIMES DAILY (Patient taking differently: 5 Units. INJECT 5 UNITS UNDER THE SKIN THREE TIMES DAILY) 9 mL 0  . Insulin Pen Needle (B-D ULTRAFINE III SHORT PEN) 31G X 8 MM MISC USE AS DIRECTED AS NEEDED 100 each 5  . INVOKANA 300 MG TABS tablet TAKE 1 TABLET(300 MG) BY MOUTH DAILY BEFORE BREAKFAST 30 tablet 3  . Lancets Misc. (ONE TOUCH SURESOFT) MISC Use 1 lancet per test. Test blood sugars 1-4 times per day as instructed. 1 each 1  . loratadine (CLARITIN) 10 MG tablet Take 1 tablet (10  mg total) by mouth daily as needed for allergies. 30 tablet 11  . rosuvastatin (CRESTOR) 5 MG tablet Take 1 tablet (5 mg total) by mouth daily. 90 tablet 3  . TRESIBA FLEXTOUCH 200 UNIT/ML SOPN INJECT 60 UNITS UNDER THE SKIN EVERY NIGHT AT BEDTIME (Patient taking differently: 50 Units. Every am) 9 pen 2  . triamcinolone (NASACORT) 55 MCG/ACT AERO nasal inhaler Place 2 sprays into the nose daily. 1 Inhaler 12  . TRIUMEQ 600-50-300 MG tablet TAKE 1 TABLET BY MOUTH DAILY 90 tablet 1   No current facility-administered medications on file prior to visit.     Review of Systems: a complete, 10pt review of systems was completed with pertinent positives and negatives as documented in the HPI  Physical Exam:  Gen: alert and well appearing Eye: extraocular motion intact, no scleral icterus ENT: moist mucus membranes, dentition intact Neck: no mass or thyromegaly Chest: unlabored respirations, symmetrical air entry, clear bilaterally CV: regular rate and rhythm, no pedal edema Abdomen: soft, nontender, nondistended. No mass or organomegaly MSK: strength symmetrical throughout, no deformity Neuro: grossly intact, normal gait Psych: normal mood and affect, appropriate insight Skin: warm and dry, no rash or lesion on limited exam    CBC Latest Ref Rng & Units 07/21/2018 06/11/2018 03/18/2018  WBC 3.8 - 10.8 Thousand/uL 11.6(H) 12.2(H) 18.6(H)  Hemoglobin 13.2 - 17.1 g/dL 12.7(L) 13.6 13.9  Hematocrit 38.5 - 50.0 % 37.4(L) 39.9 41.3  Platelets 140 - 400 Thousand/uL 313 294.0 307    CMP Latest Ref Rng & Units 09/14/2018 07/21/2018 06/11/2018  Glucose 70 - 99 mg/dL 136(H) 181(H) 93  BUN 6 - 23 mg/dL 19 14 24(H)  Creatinine 0.40 - 1.50 mg/dL 0.91 0.90 0.87  Sodium 135 - 145 mEq/L 139 140 140  Potassium 3.5 - 5.1 mEq/L 4.1 3.8 4.0  Chloride 96 - 112 mEq/L 105 107 106  CO2 19 - 32 mEq/L _0 Calcium 8.4 - 10.5 mg/dL 9.0 8.9 9.2  Total Protein 6.0 - 8.3 g/dL 7.7 7.2 7.3  Total Bilirubin 0.2 - 1.2  mg/dL 0.3 0.3 0.5  Alkaline Phos 39 - 117 U/L 65 - 68  AST 0 - 37 U/L _1 ALT 0 - 53 U/L _2 No results found for: INR, PROTIME  Imaging: No results found.   A/P: SYMPTOMATIC CHOLELITHIASIS (K80.20) Story: He has some elements of his presentation which are  consistent with symptomatic cholelithiasis, the only unusual factor is that he does not really have much in the way of abdominal pain. I do think it is reasonable to proceed with laparoscopic cholecystectomy with possible cholangiogram. Discussed risks of surgery including bleeding, pain, scarring, intraabdominal injury specifically to the common bile duct and sequelae, bile leak, conversion to open surgery, failure to resolve symptoms, blood clots/ pulmonary embolus, heart attack, pneumonia, stroke, death. Questions welcomed and answered to patient's satisfaction. He would like to go ahead and schedule surgery.   Romana Juniper, MD [facility], [place] Pager [phone]

## 2018-11-26 ENCOUNTER — Ambulatory Visit: Payer: 59 | Admitting: Gastroenterology

## 2018-11-30 ENCOUNTER — Encounter: Payer: Self-pay | Admitting: General Surgery

## 2018-11-30 ENCOUNTER — Encounter: Payer: Self-pay | Admitting: Gastroenterology

## 2018-11-30 ENCOUNTER — Telehealth: Payer: Self-pay | Admitting: General Surgery

## 2018-11-30 ENCOUNTER — Telehealth: Payer: Self-pay | Admitting: *Deleted

## 2018-11-30 ENCOUNTER — Ambulatory Visit (INDEPENDENT_AMBULATORY_CARE_PROVIDER_SITE_OTHER): Payer: 59 | Admitting: Gastroenterology

## 2018-11-30 VITALS — Wt 247.0 lb

## 2018-11-30 DIAGNOSIS — K219 Gastro-esophageal reflux disease without esophagitis: Secondary | ICD-10-CM | POA: Diagnosis not present

## 2018-11-30 DIAGNOSIS — R197 Diarrhea, unspecified: Secondary | ICD-10-CM | POA: Diagnosis not present

## 2018-11-30 DIAGNOSIS — R112 Nausea with vomiting, unspecified: Secondary | ICD-10-CM

## 2018-11-30 NOTE — Telephone Encounter (Signed)
FYI Dr Nandigam 

## 2018-11-30 NOTE — Telephone Encounter (Signed)
Erroneous encounter

## 2018-11-30 NOTE — Telephone Encounter (Signed)
Patient just needs his colon/EGD scheduled L/M  [facility] can schedule colon/egd and I will mail off paperwork to patient

## 2018-11-30 NOTE — Telephone Encounter (Signed)
Patient updated medical record for [facility]ximity visit today.

## 2018-11-30 NOTE — Patient Instructions (Addendum)
Schedule EGD and colonoscopy, next available appointment  It has been recommended to you by your physician that you have a(n) Colonoscopy/Endoscopy completed. Per your request, we did not schedule the procedure(s) today. Please contact our office at [phone] should you decide to have the procedure completed.  Check fecal fat qualitative and elastase ( Orders are in for you to have these test done) Lab is at [place] level  7:30am - 4:30pm)  Follow-up virtual visit in 4 to 6 weeks  I appreciate the  opportunity to care for you  Thank You   Harl Bowie , MD

## 2018-11-30 NOTE — Telephone Encounter (Signed)
Patient called back to schedule however the dates that were available did not fix his schedule. Patient is going to call back to schedule for a September date once schedule opens up. He is looking for an early Monday morning.

## 2018-11-30 NOTE — Telephone Encounter (Signed)
ok 

## 2018-11-30 NOTE — Progress Notes (Signed)
Brett Walsh    [id number]    [birth year]  Primary Care Physician:Murray, Marvis Repress, FNP  Referring Physician: Marrian Salvage, [address] [place],  [place] [address]  This service was provided via audio and video telemedicine (Doximity) due to [place] 19 pandemic.  Patient location: Home Provider location: Office Used 2 patient identifiers to confirm the correct person. Explained the limitations in evaluation and management via telemedicine. Patient is aware of potential medical charges for this visit.  Patient consented to this virtual visit.  The persons participating in this telemedicine service were myself and the patient   Chief complaint: GERD, diarrhea HPI:  53 year old male with obesity, type 2 diabetes, hyperlipidemia with complaints of dyspepsia, heartburn and increased bowel frequency.  His symptoms are worse when he eats fatty and greasy foods.  He has 4-7 semi-formed bowel movements a day, worse after a meal.  Denies any abdominal pain, vomiting, melena or blood per rectum.  No dysphagia or odynophagia.  His blood sugars are better controlled since he had change in his medications, is tolerating Invokana better.  Abdominal ultrasound November 11, 2018 showed cholelithiasis with no gallbladder wall thickening or pericholecystic fluid, hepatic steatosis.  Never had EGD or colonoscopy  No family history of GI malignancy  Outpatient Encounter Medications as of 11/30/2018  Medication Sig  . Blood Glucose Monitoring Suppl (ONE TOUCH ULTRA MINI) w/Device KIT Use meter to check blood sugars 1-4 times daily as instructed.  . chlorhexidine (PERIDEX) 0.12 % solution Use as directed 15 mLs in the mouth or throat 2 (two) times daily.  . Continuous Blood Gluc Sensor (FREESTYLE LIBRE 14 DAY SENSOR) MISC USE 1 SENSOR EVERY 14 DAYS  . famotidine (PEPCID) 20 MG tablet Take 1 tablet (20 mg total) by mouth 2 (two) times daily.  Marland Kitchen glucose blood (ONE TOUCH  ULTRA TEST) test strip Use one strip per test. Test blood sugars 1-4 times daily as instructed.  . insulin lispro (HUMALOG KWIKPEN) 100 UNIT/ML KiwkPen INJECT 10 UNITS UNDER THE SKIN THREE TIMES DAILY (Patient taking differently: 5 Units. INJECT 5 UNITS UNDER THE SKIN THREE TIMES DAILY)  . Insulin Pen Needle (B-D ULTRAFINE III SHORT PEN) 31G X 8 MM MISC USE AS DIRECTED AS NEEDED  . INVOKANA 300 MG TABS tablet TAKE 1 TABLET(300 MG) BY MOUTH DAILY BEFORE BREAKFAST  . Lancets Misc. (ONE TOUCH SURESOFT) MISC Use 1 lancet per test. Test blood sugars 1-4 times per day as instructed.  . loratadine (CLARITIN) 10 MG tablet Take 1 tablet (10 mg total) by mouth daily as needed for allergies.  . rosuvastatin (CRESTOR) 5 MG tablet Take 1 tablet (5 mg total) by mouth daily.  . TRESIBA FLEXTOUCH 200 UNIT/ML SOPN INJECT 60 UNITS UNDER THE SKIN EVERY NIGHT AT BEDTIME (Patient taking differently: 50 Units. Every am)  . triamcinolone (NASACORT) 55 MCG/ACT AERO nasal inhaler Place 2 sprays into the nose daily.  . TRIUMEQ 027-74-128 MG tablet TAKE 1 TABLET BY MOUTH DAILY   No facility-administered encounter medications on file as of 11/30/2018.     Allergies as of 11/30/2018 - Review Complete 11/30/2018  Allergen Reaction Noted  . Metformin and related  07/24/2017  . Protonix [pantoprazole sodium] Hives 02/06/2017    Past Medical History:  Diagnosis Date  . Cataract   . Deviated septum   . Diabetes mellitus without complication ([place])   . Gallstones 10/2018  . HIV disease ([place])  Past Surgical History:  Procedure Laterality Date  . NASAL SEPTUM SURGERY  1981, [id number]    Family History  Problem Relation Age of Onset  . Diabetes Brother   . Diabetes Mother   . Diabetes Father   . Heart failure Father   . Diabetes Sister   . Colon cancer Neg Hx   . Esophageal cancer Neg Hx   . Rectal cancer Neg Hx   . Stomach cancer Neg Hx     Social History   Socioeconomic History  . Marital status:  Single    Spouse name: Not on file  . Number of children: 0  . Years of education: 25  . Highest education level: Not on file  Occupational History  . Occupation: Financial risk analyst  Social Needs  . Financial resource strain: Not on file  . Food insecurity    Worry: Not on file    Inability: Not on file  . Transportation needs    Medical: Not on file    Non-medical: Not on file  Tobacco Use  . Smoking status: Light Tobacco Smoker    Years: 36.00    Types: Cigarettes, E-cigarettes  . Smokeless tobacco: Former Systems developer  . Tobacco comment: 3-4/day  Substance and Sexual Activity  . Alcohol use: Yes    Alcohol/week: 1.0 standard drinks    Types: 1 Standard drinks or equivalent per week    Comment: socially  . Drug use: No    Types: Hydromorphone  . Sexual activity: Never    Partners: Male    Comment: pt. declined condoms  Lifestyle  . Physical activity    Days per week: Not on file    Minutes per session: Not on file  . Stress: Not on file  Relationships  . Social Herbalist on phone: Not on file    Gets together: Not on file    Attends religious service: Not on file    Active member of club or organization: Not on file    Attends meetings of clubs or organizations: Not on file    Relationship status: Not on file  . Intimate partner violence    Fear of current or ex partner: Not on file    Emotionally abused: Not on file    Physically abused: Not on file    Forced sexual activity: Not on file  Other Topics Concern  . Not on file  Social History Narrative   Fun: Watch TV, veg at home.       Review of systems: Review of Systems as per HPI All other systems reviewed and are negative.   Physical Exam: Vitals were not taken and physical exam was not performed during this virtual visit.  Data Reviewed:  Reviewed labs, radiology imaging, old records and pertinent past GI work up   Assessment and Plan/Recommendations:  53 year old male with  morbid obesity, type 2 diabetes, hyperlipidemia and hypertension  Dyspepsia and GERD: Continue Pepcid Antireflux measures Schedule EGD for further evaluation  Diarrhea: Check fecal fat and elastase  Due for colorectal cancer screening, schedule colonoscopy, will obtain random colon biopsies to exclude microscopic colitis  The risks and benefits as well as alternatives of endoscopic procedure(s) have been discussed and reviewed. All questions answered. The patient agrees to proceed.  Hepatic steatosis: Discussed with patient in detail dietary modifications, need for better glycemic control and exercise to lose weight Avoid NSAIDs or over-the-counter herbal remedies with hepatotoxins  Follow-up in 4 to 6 weeks  Damaris Hippo , MD   CC: Marrian Salvage,*

## 2018-12-06 ENCOUNTER — Ambulatory Visit (INDEPENDENT_AMBULATORY_CARE_PROVIDER_SITE_OTHER): Payer: 59 | Admitting: Family

## 2018-12-06 ENCOUNTER — Encounter: Payer: Self-pay | Admitting: Family

## 2018-12-06 DIAGNOSIS — K8020 Calculus of gallbladder without cholecystitis without obstruction: Secondary | ICD-10-CM

## 2018-12-06 DIAGNOSIS — K2100 Gastro-esophageal reflux disease with esophagitis, without bleeding: Secondary | ICD-10-CM

## 2018-12-06 NOTE — Progress Notes (Signed)
Brett Walsh is a 53 y.o. male with the following history as recorded in EpicCare:  Patient Active Problem List   Diagnosis Date Noted  . Mixed hyperlipidemia 06/11/2018  . Viral gastroenteritis 06/01/2018  . Morbid obesity ([place]) 02/02/2018  . Hyperlipidemia associated with type 2 diabetes mellitus ([place]) 02/02/2018  . Acute non intractable tension-type headache 11/12/2017  . Contusion of face 02/06/2017  . GERD (gastroesophageal reflux disease) 12/02/2016  . Medication reaction 10/14/2016  . Depression (emotion) 07/23/2016  . Insomnia 07/23/2016  . Routine general medical examination at a health care facility 06/20/2016  . TMJ (temporomandibular joint syndrome) 01/21/2016  . Slow transit constipation 08/16/2014  . HIV disease ([place]) 06/20/2014  . Diabetes type 2, uncontrolled ([place]) 06/20/2014    Current Outpatient Medications  Medication Sig Dispense Refill  . Blood Glucose Monitoring Suppl (ONE TOUCH ULTRA MINI) w/Device KIT Use meter to check blood sugars 1-4 times daily as instructed. 1 each 0  . chlorhexidine (PERIDEX) 0.12 % solution Use as directed 15 mLs in the mouth or throat 2 (two) times daily.    . Continuous Blood Gluc Sensor (FREESTYLE LIBRE 14 DAY SENSOR) MISC USE 1 SENSOR EVERY 14 DAYS 2 each 2  . famotidine (PEPCID) 20 MG tablet Take 1 tablet (20 mg total) by mouth 2 (two) times daily. 60 tablet 1  . glucose blood (ONE TOUCH ULTRA TEST) test strip Use one strip per test. Test blood sugars 1-4 times daily as instructed. 100 each 12  . insulin lispro (HUMALOG KWIKPEN) 100 UNIT/ML KiwkPen INJECT 10 UNITS UNDER THE SKIN THREE TIMES DAILY (Patient taking differently: 5 Units. INJECT 5 UNITS UNDER THE SKIN THREE TIMES DAILY) 9 mL 0  . Insulin Pen Needle (B-D ULTRAFINE III SHORT PEN) 31G X 8 MM MISC USE AS DIRECTED AS NEEDED 100 each 5  . INVOKANA 300 MG TABS tablet TAKE 1 TABLET(300 MG) BY MOUTH DAILY BEFORE BREAKFAST 30 tablet 3  . Lancets Misc. (ONE TOUCH SURESOFT) MISC  Use 1 lancet per test. Test blood sugars 1-4 times per day as instructed. 1 each 1  . loratadine (CLARITIN) 10 MG tablet Take 1 tablet (10 mg total) by mouth daily as needed for allergies. 30 tablet 11  . rosuvastatin (CRESTOR) 5 MG tablet Take 1 tablet (5 mg total) by mouth daily. 90 tablet 3  . TRESIBA FLEXTOUCH 200 UNIT/ML SOPN INJECT 60 UNITS UNDER THE SKIN EVERY NIGHT AT BEDTIME (Patient taking differently: 50 Units. Every am) 9 pen 2  . triamcinolone (NASACORT) 55 MCG/ACT AERO nasal inhaler Place 2 sprays into the nose daily. 1 Inhaler 12  . TRIUMEQ 600-50-300 MG tablet TAKE 1 TABLET BY MOUTH DAILY 90 tablet 1   No current facility-administered medications for this visit.     Allergies: Metformin and related and Protonix [pantoprazole sodium]  Past Medical History:  Diagnosis Date  . Cataract   . Deviated septum   . Diabetes mellitus without complication ([place])   . Gallstones 10/2018  . HIV disease [facility])     Past Surgical History:  Procedure Laterality Date  . NASAL SEPTUM SURGERY  1981, [id number]    Family History  Problem Relation Age of Onset  . Diabetes Brother   . Diabetes Mother   . Diabetes Father   . Heart failure Father   . Diabetes Sister   . Colon cancer Neg Hx   . Esophageal cancer Neg Hx   . Rectal cancer Neg Hx   . Stomach cancer Neg Hx  Social History   Tobacco Use  . Smoking status: Light Tobacco Smoker    Years: 36.00    Types: Cigarettes, E-cigarettes  . Smokeless tobacco: Former Systems developer  . Tobacco comment: 3-4/day  Substance Use Topics  . Alcohol use: Yes    Alcohol/week: 1.0 standard drinks    Types: 1 Standard drinks or equivalent per week    Comment: socially    Subjective:     I connected with Brett Walsh on 12/06/18 at 10:20 AM EDT by a video enabled telemedicine application and verified that I am speaking with the correct person using two identifiers.   I discussed the limitations of evaluation and management by telemedicine and  the availability of in person appointments. The patient expressed understanding and agreed to proceed.   Started this morning with sudden onset of coughing/ nausea; notes that after he vomited he immediately felt better; no fever; no body aches; no loss of taste or smell; has recently been found to have gallbladder disease- unfortunately unable to have surgery at this time due to cost; also under care of GI for acid reflux- endoscopy and colonoscopy are being scheduled;   Was told by his employer to discuss whether COVID testing is necessary; will need a note to return to work;     Objective:  There were no vitals filed for this visit.  General: Well developed, well nourished, in no acute distress  Head: Normocephalic and atraumatic  Lungs: Respirations unlabored; Neurologic: Alert and oriented; speech intact; face symmetrical;   Assessment:  1. Gastroesophageal reflux disease with esophagitis   2. Calculus of gallbladder without cholecystitis without obstruction     Plan:  Low suspicion for COVID as patient has had these type of symptoms for the past year; encouraged to take his Pepcid twice a day as directed; encouraged to eat bland foods, limit spicy, fatty foods; keep planned follow-up with GI and general surgery as able; note written to allow patient to return to work.   No follow-ups on file.  No orders of the defined types were placed in this encounter.   Requested Prescriptions    No prescriptions requested or ordered in this encounter

## 2018-12-08 ENCOUNTER — Other Ambulatory Visit: Payer: Self-pay | Admitting: Family

## 2018-12-20 ENCOUNTER — Other Ambulatory Visit: Payer: Self-pay

## 2018-12-20 ENCOUNTER — Other Ambulatory Visit (INDEPENDENT_AMBULATORY_CARE_PROVIDER_SITE_OTHER): Payer: 59

## 2018-12-20 ENCOUNTER — Other Ambulatory Visit: Payer: Self-pay | Admitting: Endocrinology

## 2018-12-20 DIAGNOSIS — Z794 Long term (current) use of insulin: Secondary | ICD-10-CM | POA: Diagnosis not present

## 2018-12-20 DIAGNOSIS — E669 Obesity, unspecified: Secondary | ICD-10-CM

## 2018-12-20 DIAGNOSIS — E1169 Type 2 diabetes mellitus with other specified complication: Secondary | ICD-10-CM

## 2018-12-20 DIAGNOSIS — E1165 Type 2 diabetes mellitus with hyperglycemia: Secondary | ICD-10-CM

## 2018-12-20 LAB — BASIC METABOLIC PANEL
BUN: 28 mg/dL — ABNORMAL HIGH (ref 6–23)
CO2: 22 mEq/L (ref 19–32)
Calcium: 8.9 mg/dL (ref 8.4–10.5)
Chloride: 107 mEq/L (ref 96–112)
Creatinine, Ser: 1.34 mg/dL (ref 0.40–1.50)
GFR: 56.2 mL/min — ABNORMAL LOW (ref >60.00)
Glucose, Bld: 150 mg/dL — ABNORMAL HIGH (ref 70–99)
Potassium: 3.6 mEq/L (ref 3.5–5.1)
Sodium: 140 mEq/L (ref 135–145)

## 2018-12-20 LAB — HEMOGLOBIN A1C: Hgb A1c MFr Bld: 6.9 % — ABNORMAL HIGH (ref 4.6–6.5)

## 2018-12-21 NOTE — Progress Notes (Addendum)
Patient ID: Brett Walsh, male   DOB: 01/26/1966, 53 y.o.   MRN: [id number]           Reason for Appointment: Follow-up for Type 2 Diabetes  Today's office visit was provided via telemedicine using video technique The patient was explained the limitations of evaluation and management by telemedicine and the availability of in person appointments.  The patient understood the limitations and agreed to proceed. Patient also understood that the telehealth visit is billable. . Location of the patient: Patient's home . Location of the provider: Physician office Only the patient and myself were participating in the encounter    History of Present Illness:          Date of diagnosis of type 2 diabetes mellitus: 2012       Background history:   At the time of diagnosis he weighed about 268 pounds His blood sugars were markedly increased at that time when he was hospitalized for treatment and sent home on insulin, metformin and Avandia He says that however he was able to lose weight with improved diet and was able to get off medications and insulin  He has been on various treatment since a couple of years later but detailed records are not available, moved from [place] His A1c has been as high as 14.5 in 2017 when he was prescribed Basaglar and Humalog although he does not remember this Subsequently his blood sugar control has been quite variable He was first started on metformin in 06/2016 and the dose was progressively increased  Recent history:   Instructions on last visit:  Reduce Tresiba by 4 units for now and may need to reduce further later  Increase mealtime dose by 2 to 4 units when eating larger meals or eating out with low carbohydrates or sweet  Try to take insulin before eating, he is currently taking it 5 to 10 minutes after eating  Start walking for exercise   INSULIN regimen is: 50 units TRESIBA daily.  Humalog none recently  Non-insulin hypoglycemic drugs  the patient is taking are: Invokana 300 mg daily  Most recent A1c is 6.9, previously 7.3 and as high as 13.8  Current history of diabetes, recent management, blood sugar patterns and problems identified:  His blood sugars are still generally well controlled  Since his last visit he has been able to reduce his Tresiba by 10 units  He says he ran out of Humalog and has not taken it for several months  However with using 300 mg of Invokana his blood sugars are generally fairly well controlled and he does have periodic hypoglycemic episode after meals but not often and only when he is going off his diet or having more drinks  Since his last visit he has started walking 2 miles every afternoon  With this he thinks he has lost 20 pounds  Although he has not compared his CGM to fingersticks he was symptomatic with shakiness at night when his blood sugar was low Unable to compare the lab glucose to the fingerstick since glucose was 150 in the lab but he felt hypoglycemic before coming into the lab       Side effects from medications have been: Diarrhea from high-dose metformin  Compliance with the medical regimen: Improving   Glucose monitoring:  done several times a day         Glucometer:  On recently freestyle libre   CONTINUOUS GLUCOSE MONITORING RECORD INTERPRETATION    Dates of Recording: 7/7   through 7/20  Sensor description: Freestyle libre  Results statistics:   CGM use % of time  80  Average and SD  129  Time in range     3   %  % Time Above 180  7  % Time above 250   % Time Below target 0    Glycemic patterns summary:   Hyperglycemic episodes have been present inconsistently after lunch and dinner but mostly occurring on 7/7 as well as sporadically on a few other day with overall highest readings after lunch  Hypoglycemic episodes have been minimal blood sugars in the 60s yesterday morning before breakfast and reportedly reading low this morning also  Overnight  periods: Blood sugars are usually fairly stable averaging 120 before 4 AM and then in the 120s  Preprandial periods: Blood sugars are between 120 and 130 before breakfast and lunch and relatively low around 112 at dinnertime  Postprandial periods:   After breakfast: Has inconsistent rise in blood sugars after breakfast only occasionally  After lunch: Blood sugars are variable with average reading 157 and occasionally over 200    After dinner: Has only occasional hypoglycemia but rarely up to about 180, average blood sugar only 136  Previous data:  Blood Glucose readings by download   CGM use % of time  81  2-week average/SD  119  Time in range      90 %  % Time Above 180  8  % Time above 250   % Time Below 70 2     PRE-MEAL Fasting Lunch Dinner Bedtime Overall  Glucose range:       Averages:  108  127  118   119   POST-MEAL PC Breakfast PC Lunch PC Dinner  Glucose range:     Averages:  135  153  125       Self-care: The diet that the patient has been following is: tries to limit sugar, may occasionally have fried food.      Usually eating breakfast at 5 AM and dinner at 7-8 PM                Dietician visit, most recent: Never               Exercise:  Trying to walk a little otherwise has sedentary job  Weight history:  Wt Readings from Last 3 Encounters:  11/30/18 247 lb (112 kg)  08/10/18 247 lb (112 kg)  08/04/18 247 lb (112 kg)    Glycemic control:   Lab Results  Component Value Date   HGBA1C 6.9 (H) 12/20/2018   HGBA1C 7.0 (H) 09/14/2018   HGBA1C 6.9 (H) 06/15/2018   Lab Results  Component Value Date   MICROALBUR 1.5 09/28/2017   LDLCALC 53 09/14/2018   CREATININE 1.34 12/20/2018   Lab Results  Component Value Date   MICRALBCREAT 1.4 09/28/2017    Lab Results  Component Value Date   FRUCTOSAMINE 436 (H) 09/28/2017      Allergies as of 12/22/2018      Reactions   Metformin And Related    GI upset   Protonix [pantoprazole Sodium] Hives       Medication List       Accurate as of December 21, 2018  8:55 PM. If you have any questions, ask your nurse or doctor.        chlorhexidine 0.12 % solution Commonly known as: PERIDEX Use as directed 15 mLs in the mouth or throat 2 (  two) times daily.   famotidine 20 MG tablet Commonly known as: PEPCID TAKE 1 TABLET(20 MG) BY MOUTH TWICE DAILY   FreeStyle Libre 14 Day Sensor Misc USE 1 SENSOR EVERY 14 DAYS   glucose blood test strip Commonly known as: ONE TOUCH ULTRA TEST Use one strip per test. Test blood sugars 1-4 times daily as instructed.   insulin lispro 100 UNIT/ML KiwkPen Commonly known as: HumaLOG KwikPen INJECT 10 UNITS UNDER THE SKIN THREE TIMES DAILY What changed:   how much to take  additional instructions   Insulin Pen Needle 31G X 8 MM Misc Commonly known as: B-D ULTRAFINE III SHORT PEN USE AS DIRECTED AS NEEDED   Invokana 300 MG Tabs tablet Generic drug: canagliflozin TAKE 1 TABLET(300 MG) BY MOUTH DAILY BEFORE BREAKFAST   loratadine 10 MG tablet Commonly known as: CLARITIN Take 1 tablet (10 mg total) by mouth daily as needed for allergies.   ONE TOUCH SURESOFT Misc Use 1 lancet per test. Test blood sugars 1-4 times per day as instructed.   ONE TOUCH ULTRA MINI w/Device Kit Use meter to check blood sugars 1-4 times daily as instructed.   rosuvastatin 5 MG tablet Commonly known as: Crestor Take 1 tablet (5 mg total) by mouth daily.   [name] FlexTouch 200 UNIT/ML Sopn Generic drug: Insulin Degludec INJECT 60 UNITS UNDER THE SKIN EVERY NIGHT AT BEDTIME What changed: See the new instructions.   triamcinolone 55 MCG/ACT Aero nasal inhaler Commonly known as: NASACORT Place 2 sprays into the nose daily.   Triumeq 600-50-300 MG tablet Generic drug: abacavir-dolutegravir-lamiVUDine TAKE 1 TABLET BY MOUTH DAILY       Allergies:  Allergies  Allergen Reactions  . Metformin And Related     GI upset  . Protonix [Pantoprazole Sodium] Hives     Past Medical History:  Diagnosis Date  . Cataract   . Deviated septum   . Diabetes mellitus without complication ([place])   . Gallstones 10/2018  . HIV disease [facility])     Past Surgical History:  Procedure Laterality Date  . NASAL SEPTUM SURGERY  1981, [id number]    Family History  Problem Relation Age of Onset  . Diabetes Brother   . Diabetes Mother   . Diabetes Father   . Heart failure Father   . Diabetes Sister   . Colon cancer Neg Hx   . Esophageal cancer Neg Hx   . Rectal cancer Neg Hx   . Stomach cancer Neg Hx     Social History:  reports that he has been smoking cigarettes and e-cigarettes. He has smoked for the past 36.00 years. He has quit using smokeless tobacco. He reports current alcohol use of about 1.0 standard drinks of alcohol per week. He reports that he does not use drugs.   Review of Systems  Lipid history: Has been on statin drugs with Crestor 5 mg daily since 2019 Both LDL and triglycerides are good now He has lost weight    Lab Results  Component Value Date   CHOL 109 09/14/2018   HDL 34.70 (L) 09/14/2018   LDLCALC 53 09/14/2018   LDLDIRECT 101.0 06/15/2018   TRIG 108.0 09/14/2018   CHOLHDL 3 09/14/2018           He does have fatty liver on his recent imaging  Lab Results  Component Value Date   ALT 14 09/14/2018    Hypertension: No history of depression  BP Readings from Last 3 Encounters:  08/04/18 121/79  08/03/18 130/78  06/17/18 110/74    Most recent eye exam was in 10/18  Most recent foot exam: 08/2017    LABS:  Lab on 12/20/2018  Component Date Value Ref Range Status  . Sodium 12/20/2018 140  135 - 145 mEq/L Final  . Potassium 12/20/2018 3.6  3.5 - 5.1 mEq/L Final  . Chloride 12/20/2018 107  96 - 112 mEq/L Final  . CO2 12/20/2018 22  19 - 32 mEq/L Final  . Glucose, Bld 12/20/2018 150* 70 - 99 mg/dL Final  . BUN 12/20/2018 28* 6 - 23 mg/dL Final  . Creatinine, Ser 12/20/2018 1.34  0.40 - 1.50 mg/dL Final  .  Calcium 12/20/2018 8.9  8.4 - 10.5 mg/dL Final  . GFR 12/20/2018 56.20* >60.00 mL/min Final  . Hgb A1c MFr Bld 12/20/2018 6.9* 4.6 - 6.5 % Final   Glycemic Control Guidelines for People with Diabetes:Non Diabetic:  <6%Goal of Therapy: <7%Additional Action Suggested:  >8%     Physical Examination:  There were no vitals taken for this visit.         ASSESSMENT:  Diabetes type 2, with recent BMI about 39  See history of present illness for detailed discussion of current diabetes management, blood sugar patterns and problems identified  He is insulin-dependent  His blood sugars have been improved consistently now A1c is 6.9 again  He has benefited from higher doses of Invokana Also able to lose weight with consistent diet and starting exercise also The last couple of days his morning sugars have been relatively low with symptoms of hypoglycemia, currently on 50 units Tresiba Not clear if he needs Humalog anymore since he only has high readings postprandially when he goes off his diet which is not regularly   LIPIDS: Excellent control with Crestor and improve diet continue Crestor for cardiovascular protection  Urine microalbumin to be checked on the next visit  PLAN:    Reduce Tresiba by 6 units and he will take 44 units now  May need to reduce further if morning sugars are low normal and he can do it in steps of 4 units  Currently does not appear to be needing Humalog insulin as long as he is consistent with his diet  Otherwise consider having him take Humalog only for larger meals or with more carbohydrate  Encouraged him to continue walking as he has  He is comfortable continuing the freestyle libre which is helping him monitor his blood sugars immediately throughout the day  However would like to compare his freestyle libre to the fingersticks  Follow-up in 4 months   There are no Patient Instructions on file for this visit.   Total visit time for evaluation and  management of multiple problems and counseling =25 minutes    Ajay Kumar 12/21/2018, 8:55 PM   Note: This office note was prepared with Dragon voice recognition system technology. Any transcriptional errors that result from this process are unintentional.  

## 2018-12-22 ENCOUNTER — Encounter: Payer: Self-pay | Admitting: Endocrinology

## 2018-12-22 ENCOUNTER — Ambulatory Visit (INDEPENDENT_AMBULATORY_CARE_PROVIDER_SITE_OTHER): Payer: 59 | Admitting: Endocrinology

## 2018-12-22 ENCOUNTER — Other Ambulatory Visit: Payer: Self-pay

## 2018-12-22 DIAGNOSIS — E782 Mixed hyperlipidemia: Secondary | ICD-10-CM

## 2018-12-22 DIAGNOSIS — K760 Fatty (change of) liver, not elsewhere classified: Secondary | ICD-10-CM

## 2018-12-22 DIAGNOSIS — Z794 Long term (current) use of insulin: Secondary | ICD-10-CM | POA: Diagnosis not present

## 2018-12-22 DIAGNOSIS — E1165 Type 2 diabetes mellitus with hyperglycemia: Secondary | ICD-10-CM | POA: Diagnosis not present

## 2019-01-05 ENCOUNTER — Other Ambulatory Visit: Payer: Self-pay | Admitting: Endocrinology

## 2019-01-13 ENCOUNTER — Other Ambulatory Visit: Payer: Self-pay | Admitting: Endocrinology

## 2019-02-03 ENCOUNTER — Other Ambulatory Visit: Payer: Self-pay | Admitting: Family

## 2019-03-22 ENCOUNTER — Other Ambulatory Visit: Payer: Self-pay

## 2019-03-22 DIAGNOSIS — Z20822 Contact with and (suspected) exposure to covid-19: Secondary | ICD-10-CM

## 2019-03-24 LAB — NOVEL CORONAVIRUS, NAA: SARS-CoV-2, NAA: NOT DETECTED

## 2019-03-30 ENCOUNTER — Other Ambulatory Visit: Payer: Self-pay | Admitting: Endocrinology

## 2019-03-31 ENCOUNTER — Other Ambulatory Visit: Payer: Self-pay | Admitting: Endocrinology

## 2019-04-08 ENCOUNTER — Other Ambulatory Visit: Payer: Self-pay

## 2019-04-08 DIAGNOSIS — Z20822 Contact with and (suspected) exposure to covid-19: Secondary | ICD-10-CM

## 2019-04-10 LAB — NOVEL CORONAVIRUS, NAA: SARS-CoV-2, NAA: NOT DETECTED

## 2019-04-20 ENCOUNTER — Other Ambulatory Visit: Payer: Self-pay | Admitting: Infectious Diseases

## 2019-04-20 ENCOUNTER — Other Ambulatory Visit: Payer: 59

## 2019-04-20 ENCOUNTER — Encounter: Payer: Self-pay | Admitting: Family

## 2019-04-20 ENCOUNTER — Other Ambulatory Visit: Payer: Self-pay

## 2019-04-20 ENCOUNTER — Other Ambulatory Visit: Payer: Self-pay | Admitting: Family

## 2019-04-20 ENCOUNTER — Ambulatory Visit (INDEPENDENT_AMBULATORY_CARE_PROVIDER_SITE_OTHER): Payer: 59 | Admitting: Family

## 2019-04-20 DIAGNOSIS — Z79899 Other long term (current) drug therapy: Secondary | ICD-10-CM

## 2019-04-20 DIAGNOSIS — Z20822 Contact with and (suspected) exposure to covid-19: Secondary | ICD-10-CM

## 2019-04-20 DIAGNOSIS — J069 Acute upper respiratory infection, unspecified: Secondary | ICD-10-CM | POA: Diagnosis not present

## 2019-04-20 DIAGNOSIS — B20 Human immunodeficiency virus [HIV] disease: Secondary | ICD-10-CM

## 2019-04-20 DIAGNOSIS — Z113 Encounter for screening for infections with a predominantly sexual mode of transmission: Secondary | ICD-10-CM

## 2019-04-20 NOTE — Addendum Note (Signed)
Addended by: Dolan Amen D on: 04/20/2019 08:52 AM   Modules accepted: Orders

## 2019-04-20 NOTE — Progress Notes (Signed)
Brett Walsh is a 53 y.o. male with the following history as recorded in EpicCare:  Patient Active Problem List   Diagnosis Date Noted  . Mixed hyperlipidemia 06/11/2018  . Viral gastroenteritis 06/01/2018  . Morbid obesity ([place]) 02/02/2018  . Hyperlipidemia associated with type 2 diabetes mellitus ([place]) 02/02/2018  . Acute non intractable tension-type headache 11/12/2017  . Contusion of face 02/06/2017  . GERD (gastroesophageal reflux disease) 12/02/2016  . Medication reaction 10/14/2016  . Depression (emotion) 07/23/2016  . Insomnia 07/23/2016  . Routine general medical examination at a health care facility 06/20/2016  . TMJ (temporomandibular joint syndrome) 01/21/2016  . Slow transit constipation 08/16/2014  . HIV disease ([place]) 06/20/2014  . Diabetes type 2, uncontrolled ([place]) 06/20/2014    Current Outpatient Medications  Medication Sig Dispense Refill  . Blood Glucose Monitoring Suppl (ONE TOUCH ULTRA MINI) w/Device KIT Use meter to check blood sugars 1-4 times daily as instructed. 1 each 0  . chlorhexidine (PERIDEX) 0.12 % solution Use as directed 15 mLs in the mouth or throat 2 (two) times daily.    . Continuous Blood Gluc Sensor (FREESTYLE LIBRE 14 DAY SENSOR) MISC USE 1 SENSOR EVERY 14 DAYS 2 each 2  . famotidine (PEPCID) 20 MG tablet TAKE 1 TABLET(20 MG) BY MOUTH TWICE DAILY 60 tablet 1  . glucose blood (ONE TOUCH ULTRA TEST) test strip Use one strip per test. Test blood sugars 1-4 times daily as instructed. 100 each 12  . Insulin Pen Needle (B-D ULTRAFINE III SHORT PEN) 31G X 8 MM MISC USE AS DIRECTED AS NEEDED 100 each 5  . INVOKANA 300 MG TABS tablet TAKE 1 TABLET(300 MG) BY MOUTH DAILY BEFORE BREAKFAST 30 tablet 3  . Lancets Misc. (ONE TOUCH SURESOFT) MISC Use 1 lancet per test. Test blood sugars 1-4 times per day as instructed. 1 each 1  . loratadine (CLARITIN) 10 MG tablet Take 1 tablet (10 mg total) by mouth daily as needed for allergies. 30 tablet 11  .  rosuvastatin (CRESTOR) 5 MG tablet TAKE 1 TABLET(5 MG) BY MOUTH DAILY 90 tablet 3  . TRESIBA FLEXTOUCH 200 UNIT/ML SOPN INJECT 60 UNITS UNDER THE SKIN EVERY NIGHT AT BEDTIME (Patient taking differently: 50 Units. Every am) 9 pen 2  . triamcinolone (NASACORT) 55 MCG/ACT AERO nasal inhaler Place 2 sprays into the nose daily. 1 Inhaler 12  . TRIUMEQ 600-50-300 MG tablet TAKE 1 TABLET BY MOUTH DAILY 90 tablet 1   No current facility-administered medications for this visit.     Allergies: Metformin and related and Protonix [pantoprazole sodium]  Past Medical History:  Diagnosis Date  . Cataract   . Deviated septum   . Diabetes mellitus without complication ([place])   . Gallstones 10/2018  . HIV disease [facility])     Past Surgical History:  Procedure Laterality Date  . NASAL SEPTUM SURGERY  1981, [id number]    Family History  Problem Relation Age of Onset  . Diabetes Brother   . Diabetes Mother   . Diabetes Father   . Heart failure Father   . Diabetes Sister   . Colon cancer Neg Hx   . Esophageal cancer Neg Hx   . Rectal cancer Neg Hx   . Stomach cancer Neg Hx     Social History   Tobacco Use  . Smoking status: Light Tobacco Smoker    Years: 36.00    Types: Cigarettes, E-cigarettes  . Smokeless tobacco: Former Systems developer  . Tobacco comment: 3-4/day  Substance Use Topics  .  Alcohol use: Yes    Alcohol/week: 1.0 standard drinks    Types: 1 Standard drinks or equivalent per week    Comment: socially    Subjective:     I connected with Brett Walsh on 04/20/19 at 11:00 AM EST by a video enabled telemedicine application and verified that I am speaking with the correct person using two identifiers. Provider is in office, patient is in his car waiting to be tested for COVID; patient and provider are only 2 people on video call.    I discussed the limitations of evaluation and management by telemedicine and the availability of in person appointments. The patient expressed understanding and  agreed to proceed.   3 day history of runny nose, body aches; no fever, or shortness of breath; no loss of sense of taste or smell; is currently in line to be tested for COVID; Notes that it is not easy for him to quarantine from work while waiting on test results.   Objective:  There were no vitals filed for this visit.  General: Well developed, well nourished, in no acute distress  Head: Normocephalic and atraumatic  Lungs: Respirations unlabored;  Neurologic: Alert and oriented; speech intact; face symmetrical;   Assessment:  1. Viral URI     Plan:  Patient was actively being tested for COVID during course of phone call; he is asked to quarantine at home until his results come back- work note is given as requested; follow-up to be determined.   No follow-ups on file.  No orders of the defined types were placed in this encounter.   Requested Prescriptions    No prescriptions requested or ordered in this encounter

## 2019-04-21 LAB — T-HELPER CELL (CD4) - (RCID CLINIC ONLY)
CD4 % Helper T Cell: 9 % — ABNORMAL LOW (ref 33–65)
CD4 T Cell Abs: 346 /uL — ABNORMAL LOW (ref 400–1790)

## 2019-04-22 LAB — NOVEL CORONAVIRUS, NAA: SARS-CoV-2, NAA: NOT DETECTED

## 2019-04-25 ENCOUNTER — Other Ambulatory Visit: Payer: Self-pay

## 2019-04-25 ENCOUNTER — Other Ambulatory Visit (INDEPENDENT_AMBULATORY_CARE_PROVIDER_SITE_OTHER): Payer: 59

## 2019-04-25 DIAGNOSIS — E1165 Type 2 diabetes mellitus with hyperglycemia: Secondary | ICD-10-CM | POA: Diagnosis not present

## 2019-04-25 DIAGNOSIS — Z794 Long term (current) use of insulin: Secondary | ICD-10-CM | POA: Diagnosis not present

## 2019-04-25 LAB — COMPREHENSIVE METABOLIC PANEL
ALT: 56 U/L — ABNORMAL HIGH (ref 0–53)
AST: 29 U/L (ref 0–37)
Albumin: 3.9 g/dL (ref 3.5–5.2)
Alkaline Phosphatase: 74 U/L (ref 39–117)
BUN: 19 mg/dL (ref 6–23)
CO2: 25 mEq/L (ref 19–32)
Calcium: 9 mg/dL (ref 8.4–10.5)
Chloride: 104 mEq/L (ref 96–112)
Creatinine, Ser: 0.95 mg/dL (ref 0.40–1.50)
GFR: 83.47 mL/min (ref >60.00)
Glucose, Bld: 163 mg/dL — ABNORMAL HIGH (ref 70–99)
Potassium: 4 mEq/L (ref 3.5–5.1)
Sodium: 138 mEq/L (ref 135–145)
Total Bilirubin: 0.4 mg/dL (ref 0.2–1.2)
Total Protein: 7.8 g/dL (ref 6.0–8.3)

## 2019-04-25 LAB — HEMOGLOBIN A1C: Hgb A1c MFr Bld: 6.9 % — ABNORMAL HIGH (ref 4.6–6.5)

## 2019-04-26 NOTE — Progress Notes (Signed)
Patient ID: Brett Walsh, male   DOB: 21-Oct-1965, 53 y.o.   MRN: [id number]           Reason for Appointment: Follow-up for Type 2 Diabetes   History of Present Illness:          Date of diagnosis of type 2 diabetes mellitus: 2012       Background history:   At the time of diagnosis he weighed about 268 pounds His blood sugars were markedly increased at that time when he was hospitalized for treatment and sent home on insulin, metformin and Avandia He says that however he was able to lose weight with improved diet and was able to get off medications and insulin  He has been on various treatment since a couple of years later but detailed records are not available, moved from [place] His A1c has been as high as 14.5 in 2017 when he was prescribed Basaglar and Humalog although he does not remember this Subsequently his blood sugar control has been quite variable He was first started on metformin in 06/2016 and the dose was progressively increased  Recent history:    INSULIN regimen is: 0 units TRESIBA daily.  Humalog none   Non-insulin hypoglycemic drugs the patient is taking are: Invokana 300 mg daily  Most recent A1c is 6.9, unchanged  Current history of diabetes, recent management, blood sugar patterns and problems identified:  After his last visit he continued to reduce his Brett Walsh  With continuing Invokana and overall trying to watch his diet as well as increased activity he has lost significant amount of weight  About a month ago he was down to 20 units [place] and then because of the higher co-pay he did not renew that  His blood sugars are still mostly well controlled  Only has some hyperglycemia at times after dinner or lunch depending on his diet but on an average blood sugars are still within target after meals  He said that with working a 2nd job in the evening with increased activity is able to get some exercise which he was not doing  His freestyle  Brett Walsh is about 10 mg lower than lab glucose Fasting readings are mostly good except slightly higher if higher at night also       Side effects from medications have been: Diarrhea from high-dose metformin  Compliance with the medical regimen: Improving   Glucose monitoring:  done several times a day         Glucometer:  On recently freestyle libre  CGM use % of time  90  2-week average/SD  141, CV 21  Time in range      91%  % Time Above 180  eight  % Time above 250  one  % Time Below 70  zero     PRE-MEAL Fasting Lunch Dinner Bedtime Overall  Glucose range:       Averages:  124  139  130   141   POST-MEAL PC Breakfast PC Lunch PC Dinner  Glucose range:     Averages:  136  145  161      Self-care: The diet that the patient has been following is: tries to limit sugar, may occasionally have fried food.      Usually eating breakfast at 5 AM and dinner at 7-8 PM                Dietician visit, most recent: Never  Exercise:  Trying to walk a little otherwise has sedentary job  Weight history:  Wt Readings from Last 3 Encounters:  04/27/19 236 lb 6.4 oz (107.2 kg)  11/30/18 247 lb (112 kg)  08/10/18 247 lb (112 kg)    Glycemic control:   Lab Results  Component Value Date   HGBA1C 6.9 (H) 04/25/2019   HGBA1C 6.9 (H) 12/20/2018   HGBA1C 7.0 (H) 09/14/2018   Lab Results  Component Value Date   MICROALBUR 1.5 09/28/2017   LDLCALC 129 (H) 04/20/2019   CREATININE 0.95 04/25/2019   Lab Results  Component Value Date   MICRALBCREAT 1.4 09/28/2017    Lab Results  Component Value Date   FRUCTOSAMINE 436 (H) 09/28/2017      Allergies as of 04/27/2019      Reactions   Metformin And Related    GI upset   Protonix [pantoprazole Sodium] Hives      Medication List       Accurate as of April 27, 2019  8:54 AM. If you have any questions, ask your nurse or doctor.        STOP taking these medications   Tresiba FlexTouch 200 UNIT/ML Sopn  Generic drug: Insulin Degludec Stopped by: Brett Snare, MD     TAKE these medications   chlorhexidine 0.12 % solution Commonly known as: PERIDEX Use as directed 15 mLs in the mouth or throat 2 (two) times daily.   famotidine 20 MG tablet Commonly known as: PEPCID TAKE 1 TABLET(20 MG) BY MOUTH TWICE DAILY   FreeStyle Libre 14 Day Sensor Misc USE 1 SENSOR EVERY 14 DAYS   glucose blood test strip Commonly known as: ONE TOUCH ULTRA TEST Use one strip per test. Test blood sugars 1-4 times daily as instructed.   Insulin Pen Needle 31G X 8 MM Misc Commonly known as: B-D ULTRAFINE III SHORT PEN USE AS DIRECTED AS NEEDED   Invokana 300 MG Tabs tablet Generic drug: canagliflozin TAKE 1 TABLET(300 MG) BY MOUTH DAILY BEFORE BREAKFAST   loratadine 10 MG tablet Commonly known as: CLARITIN Take 1 tablet (10 mg total) by mouth daily as needed for allergies.   ONE TOUCH SURESOFT Misc Use 1 lancet per test. Test blood sugars 1-4 times per day as instructed.   ONE TOUCH ULTRA MINI w/Device Kit Use meter to check blood sugars 1-4 times daily as instructed.   repaglinide 1 MG tablet Commonly known as: Prandin Take 1 tablet (1 mg total) by mouth daily before supper. Started by: Brett Snare, MD   rosuvastatin 5 MG tablet Commonly known as: CRESTOR TAKE 1 TABLET(5 MG) BY MOUTH DAILY   triamcinolone 55 MCG/ACT Aero nasal inhaler Commonly known as: NASACORT Place 2 sprays into the nose daily.   Triumeq 600-50-300 MG tablet Generic drug: abacavir-dolutegravir-lamiVUDine TAKE 1 TABLET BY MOUTH DAILY       Allergies:  Allergies  Allergen Reactions  . Metformin And Related     GI upset  . Protonix [Pantoprazole Sodium] Hives    Past Medical History:  Diagnosis Date  . Cataract   . Deviated septum   . Diabetes mellitus without complication ([place])   . Gallstones 10/2018  . HIV disease [facility])     Past Surgical History:  Procedure Laterality Date  . NASAL SEPTUM SURGERY  1981,  [id number]    Family History  Problem Relation Age of Onset  . Diabetes Brother   . Diabetes Mother   . Diabetes Father   . Heart failure Father   .  Diabetes Sister   . Colon cancer Neg Hx   . Esophageal cancer Neg Hx   . Rectal cancer Neg Hx   . Stomach cancer Neg Hx     Social History:  reports that he has been smoking cigarettes and e-cigarettes. He has smoked for the past 36.00 years. He has quit using smokeless tobacco. He reports current alcohol use of about 1.0 standard drinks of alcohol per week. He reports that he does not use drugs.   Review of Systems  Lipid history: Has been on statin drugs with Crestor 5 mg daily since 2019 He ran out of Crestor because of the higher co-pay and has not gone back to it, LDL is much higher  Lab Results  Component Value Date   CHOL 195 04/20/2019   CHOL 109 09/14/2018   CHOL 118 07/21/2018   Lab Results  Component Value Date   HDL 37 (L) 04/20/2019   HDL 34.70 (L) 09/14/2018   HDL 38 (L) 07/21/2018   Lab Results  Component Value Date   LDLCALC 129 (H) 04/20/2019   LDLCALC 53 09/14/2018   LDLCALC 61 07/21/2018   Lab Results  Component Value Date   TRIG 171 (H) 04/20/2019   TRIG 108.0 09/14/2018   TRIG 107 07/21/2018   Lab Results  Component Value Date   CHOLHDL 5.3 (H) 04/20/2019   CHOLHDL 3 09/14/2018   CHOLHDL 3.1 07/21/2018   Lab Results  Component Value Date   LDLDIRECT 101.0 06/15/2018           He does have fatty liver on his imaging  Lab Results  Component Value Date   ALT 56 (H) 04/25/2019    Hypertension: No history of depression  BP Readings from Last 3 Encounters:  04/27/19 108/68  08/04/18 121/79  08/03/18 130/78    Most recent eye exam was in 10/18  Most recent foot exam: 08/2017    LABS:  Lab on 04/25/2019  Component Date Value Ref Range Status  . Sodium 04/25/2019 138  135 - 145 mEq/L Final  . Potassium 04/25/2019 4.0  3.5 - 5.1 mEq/L Final  . Chloride 04/25/2019 104  96 - 112  mEq/L Final  . CO2 04/25/2019 25  19 - 32 mEq/L Final  . Glucose, Bld 04/25/2019 163* 70 - 99 mg/dL Final  . BUN 04/25/2019 19  6 - 23 mg/dL Final  . Creatinine, Ser 04/25/2019 0.95  0.40 - 1.50 mg/dL Final  . Total Bilirubin 04/25/2019 0.4  0.2 - 1.2 mg/dL Final  . Alkaline Phosphatase 04/25/2019 74  39 - 117 U/L Final  . AST 04/25/2019 29  0 - 37 U/L Final  . ALT 04/25/2019 56* 0 - 53 U/L Final  . Total Protein 04/25/2019 7.8  6.0 - 8.3 g/dL Final  . Albumin 04/25/2019 3.9  3.5 - 5.2 g/dL Final  . GFR 04/25/2019 83.47  >60.00 mL/min Final  . Calcium 04/25/2019 9.0  8.4 - 10.5 mg/dL Final  . Hgb A1c MFr Bld 04/25/2019 6.9* 4.6 - 6.5 % Final   Glycemic Control Guidelines for People with Diabetes:Non Diabetic:  <6%Goal of Therapy: <7%Additional Action Suggested:  >8%     Physical Examination:  BP 108/68 (Patient Position: Standing)   Pulse 78   Ht 5' 6.5" (1.689 m)   Wt 236 lb 6.4 oz (107.2 kg)   SpO2 98%   BMI 37.58 kg/m          ASSESSMENT:  Diabetes type 2, with obesity  See  history of present illness for detailed discussion of current diabetes management, blood sugar patterns and problems identified  He has been insulin-dependent with marked increase in A1c in the past but now surprisingly his blood sugars are controlled even without any insulin  His blood sugars have been improved consistently now A1c is 6.9 again  He is doing well with Invokana Also losing weight with improved diet and increased activity Only when he is eating a large amount of carbohydrates or high fat foods he is getting blood sugars over 200 at times but not consistently He has difficulty affording some brand-name medications  LIPIDS: Previously good control with Crestor Discussed that he needs to stay on this since his LDL is much over 100 and he will check to see if he can afford the 90-day prescription  Follow blood pressure, initially high today likely to be from anxiety  Urine  microalbumin to be checked today  Abnormal ALT: This is not consistent and will need to periodically follow, likely still from  PLAN:     Continue Invokana alone for now for his basic management  He can use Prandin 1 mg on the days he is eating a larger meal or more carbohydrates especially during holiday dinners.  Discussed how this works and likely not needing it unless blood sugars are consistently higher postprandially  Also reconsider adding extended release Metformin if fasting readings are going higher  Encouraged him to stay active  Call if blood sugars are consistently above fasting or postprandial targets which were given to him  Restart Crestor  Influenza vaccine given, explained to him that he had a viral upper respiratory infection recently and still needs to be vaccinated  Overdue for eye exam and discussed need to have this done every 2 years at least, given him pamphlet on importance of eye exams and night diseases related to diabetes  Patient Instructions  Eye exam 2 yearly  Prandin 1-72m before any carby meals only  Call if sugar stays above range  Check blood sugars on waking up 7 days a week  Also check blood sugars about 2 hours after meals and do this after different meals by rotation  Recommended blood sugar levels on waking up are 90-130 and about 2 hours after meal is 130-160  Please bring your blood sugar monitor to each visit, thank you      Total visit time for evaluation and management of multiple problems and counseling =25 minutes    AElayne Snare11/25/2020, 8:54 AM   Note: This office note was prepared with Dragon voice recognition system technology. Any transcriptional errors that result from this process are unintentional.  This visit occurred during the SARS-CoV-2 public health emergency.  Safety protocols were in place, including screening questions prior to the visit, additional usage of staff PPE, and extensive cleaning of exam  room while observing appropriate contact time as indicated for disinfecting solutions.

## 2019-04-27 ENCOUNTER — Ambulatory Visit (INDEPENDENT_AMBULATORY_CARE_PROVIDER_SITE_OTHER): Payer: 59 | Admitting: Endocrinology

## 2019-04-27 ENCOUNTER — Encounter: Payer: Self-pay | Admitting: Endocrinology

## 2019-04-27 ENCOUNTER — Other Ambulatory Visit: Payer: Self-pay

## 2019-04-27 VITALS — BP 108/68 | HR 78 | Ht 66.5 in | Wt 236.4 lb

## 2019-04-27 DIAGNOSIS — E1169 Type 2 diabetes mellitus with other specified complication: Secondary | ICD-10-CM

## 2019-04-27 DIAGNOSIS — Z23 Encounter for immunization: Secondary | ICD-10-CM | POA: Diagnosis not present

## 2019-04-27 DIAGNOSIS — K760 Fatty (change of) liver, not elsewhere classified: Secondary | ICD-10-CM | POA: Diagnosis not present

## 2019-04-27 DIAGNOSIS — E782 Mixed hyperlipidemia: Secondary | ICD-10-CM | POA: Diagnosis not present

## 2019-04-27 DIAGNOSIS — E669 Obesity, unspecified: Secondary | ICD-10-CM

## 2019-04-27 LAB — COMPREHENSIVE METABOLIC PANEL
AG Ratio: 1.3 (calc) (ref 1.0–2.5)
ALT: 22 U/L (ref 9–46)
AST: 19 U/L (ref 10–35)
Albumin: 4.5 g/dL (ref 3.6–5.1)
Alkaline phosphatase (APISO): 70 U/L (ref 35–144)
BUN: 21 mg/dL (ref 7–25)
CO2: 23 mmol/L (ref 20–32)
Calcium: 9.2 mg/dL (ref 8.6–10.3)
Chloride: 107 mmol/L (ref 98–110)
Creat: 0.93 mg/dL (ref 0.70–1.33)
Globulin: 3.4 g/dL (calc) (ref 1.9–3.7)
Glucose, Bld: 149 mg/dL — ABNORMAL HIGH (ref 65–99)
Potassium: 4.7 mmol/L (ref 3.5–5.3)
Sodium: 141 mmol/L (ref 135–146)
Total Bilirubin: 0.4 mg/dL (ref 0.2–1.2)
Total Protein: 7.9 g/dL (ref 6.1–8.1)

## 2019-04-27 LAB — CBC
HCT: 43.2 % (ref 38.5–50.0)
Hemoglobin: 14.8 g/dL (ref 13.2–17.1)
MCH: 31.9 pg (ref 27.0–33.0)
MCHC: 34.3 g/dL (ref 32.0–36.0)
MCV: 93.1 fL (ref 80.0–100.0)
MPV: 11 fL (ref 7.5–12.5)
Platelets: 311 10*3/uL (ref 140–400)
RBC: 4.64 10*6/uL (ref 4.20–5.80)
RDW: 13.5 % (ref 11.0–15.0)
WBC: 14.4 10*3/uL — ABNORMAL HIGH (ref 3.8–10.8)

## 2019-04-27 LAB — HIV-1 RNA QUANT-NO REFLEX-BLD
HIV 1 RNA Quant: 23 copies/mL — ABNORMAL HIGH
HIV-1 RNA Quant, Log: 1.36 Log copies/mL — ABNORMAL HIGH

## 2019-04-27 LAB — LIPID PANEL
Cholesterol: 195 mg/dL (ref <200)
HDL: 37 mg/dL — ABNORMAL LOW (ref >=40)
LDL Cholesterol (Calc): 129 mg/dL (calc) — ABNORMAL HIGH
Non-HDL Cholesterol (Calc): 158 mg/dL (calc) — ABNORMAL HIGH (ref <130)
Total CHOL/HDL Ratio: 5.3 (calc) — ABNORMAL HIGH (ref <5.0)
Triglycerides: 171 mg/dL — ABNORMAL HIGH (ref <150)

## 2019-04-27 LAB — RPR TITER: RPR Titer: 1:1 {titer} — ABNORMAL HIGH

## 2019-04-27 LAB — FLUORESCENT TREPONEMAL AB(FTA)-IGG-BLD: Fluorescent Treponemal ABS: REACTIVE — AB

## 2019-04-27 LAB — MICROALBUMIN / CREATININE URINE RATIO
Creatinine,U: 22.3 mg/dL
Microalb Creat Ratio: 3.1 mg/g (ref 0.0–30.0)
Microalb, Ur: <0.7 mg/dL (ref 0.0–1.9)

## 2019-04-27 LAB — RPR: RPR Ser Ql: REACTIVE — AB

## 2019-04-27 MED ORDER — REPAGLINIDE 1 MG PO TABS: 1.0000 mg | ORAL_TABLET | Freq: Every day | ORAL | 1 refills | Status: DC

## 2019-04-27 NOTE — Patient Instructions (Addendum)
Eye exam 2 yearly  Prandin 1-2mg  before any carby meals only  Call if sugar stays above range  Check blood sugars on waking up 7 days a week  Also check blood sugars about 2 hours after meals and do this after different meals by rotation  Recommended blood sugar levels on waking up are 90-130 and about 2 hours after meal is 130-160  Please bring your blood sugar monitor to each visit, thank you

## 2019-05-06 ENCOUNTER — Ambulatory Visit: Payer: 59 | Admitting: Infectious Diseases

## 2019-05-06 ENCOUNTER — Encounter: Payer: 59 | Admitting: Infectious Diseases

## 2019-05-19 ENCOUNTER — Ambulatory Visit: Payer: 59 | Admitting: Infectious Diseases

## 2019-05-19 ENCOUNTER — Encounter: Payer: Self-pay | Admitting: Infectious Diseases

## 2019-05-19 ENCOUNTER — Other Ambulatory Visit: Payer: Self-pay

## 2019-05-19 VITALS — BP 137/82 | HR 83 | Wt 231.8 lb

## 2019-05-19 DIAGNOSIS — E782 Mixed hyperlipidemia: Secondary | ICD-10-CM

## 2019-05-19 DIAGNOSIS — Z113 Encounter for screening for infections with a predominantly sexual mode of transmission: Secondary | ICD-10-CM

## 2019-05-19 DIAGNOSIS — B20 Human immunodeficiency virus [HIV] disease: Secondary | ICD-10-CM

## 2019-05-19 DIAGNOSIS — Z79899 Other long term (current) drug therapy: Secondary | ICD-10-CM

## 2019-05-19 DIAGNOSIS — E11649 Type 2 diabetes mellitus with hypoglycemia without coma: Secondary | ICD-10-CM | POA: Diagnosis not present

## 2019-05-19 DIAGNOSIS — K2100 Gastro-esophageal reflux disease with esophagitis, without bleeding: Secondary | ICD-10-CM | POA: Diagnosis not present

## 2019-05-19 NOTE — Assessment & Plan Note (Addendum)
He will f/uw ith endo.  His A1C is moderately controlled.  He is working on diet. Off insulin.  I greatly appreciate the excellent care of his PCP, Dr Jodi Mourning

## 2019-05-19 NOTE — Assessment & Plan Note (Signed)
Few complaints of this today.  Has improved with diet.

## 2019-05-19 NOTE — Progress Notes (Signed)
Subjective:    Patient ID: Brett Walsh, male    DOB: [birth year], 53 y.o.   MRN: [id number]  HPI 53yo M with hx of HIV+ since 2008 (when he was hospitalized with pneumonia), on tivicay-descovy.(was on atripla prior, developed resistance), also hx of DM2 (2012) on metformin.  He was seen in f/u in ID clinic on 1-19-16and started on triumeq. States his FSG have been undercontrol- last A1C 6.9% (04-25-19).  He quit insulin.  Has lost wt, his Glc has leveled out except around lunch.  He has resumed Crestor. He has a very high co-pay. He is now also [employer]-ing (if rider doesn't have a mask, they don't ride).  Has been taking his ART without issue.  Managing diet around reflux, gallstones. Is deffering gallbladder surgery.  Looking for a new appt.  Wants to contact [facility].   Has not had colon.  Has not had ophtho this year.   HIV 1 RNA Quant (copies/mL)  Date Value  04/20/2019 23 (H)  07/21/2018 <20 DETECTED (A)  01/27/2018 <20 DETECTED (A)   CD4 T Cell Abs (/uL)  Date Value  04/20/2019 346 (L)  07/21/2018 290 (L)  01/27/2018 360 (L)    Review of Systems  Constitutional: Negative for appetite change and unexpected weight change.  Respiratory: Negative for cough and shortness of breath.   Gastrointestinal: Negative for constipation and diarrhea.  Genitourinary: Negative for difficulty urinating.  Psychiatric/Behavioral: Negative for sleep disturbance.  Please see HPI. All other systems reviewed and negative.      Objective:   Physical Exam Constitutional:      Appearance: Normal appearance.  HENT:     Mouth/Throat:     Mouth: Mucous membranes are moist.     Pharynx: No oropharyngeal exudate.  Eyes:     Extraocular Movements: Extraocular movements intact.     Pupils: Pupils are equal, round, and reactive to light.  Cardiovascular:     Rate and Rhythm: Normal rate and regular rhythm.     Pulses:          Dorsalis pedis pulses are 3+ on the right side.   Posterior tibial pulses are 3+ on the left side.  Pulmonary:     Effort: Pulmonary effort is normal.     Breath sounds: Normal breath sounds.  Abdominal:     General: Bowel sounds are normal. There is no distension.     Palpations: Abdomen is soft.     Tenderness: There is no abdominal tenderness.  Musculoskeletal:     Cervical back: Normal range of motion and neck supple.     Right lower leg: No edema.     Left lower leg: No edema.     Right foot: Normal range of motion. No deformity or Charcot foot.     Left foot: Normal range of motion. No deformity or Charcot foot.  Feet:     Right foot:     Protective Sensation: 1 site tested. 1 site sensed.    Skin integrity: Skin integrity normal.     Toenail Condition: Right toenails are normal.     Left foot:     Protective Sensation: 1 site tested. 1 site sensed.    Skin integrity: Skin integrity normal.     Toenail Condition: Left toenails are normal.  Neurological:     General: No focal deficit present.     Mental Status: He is alert.  Psychiatric:        Mood and Affect: Mood normal.  Assessment & Plan:

## 2019-05-19 NOTE — Assessment & Plan Note (Signed)
He continues on crestor.  Will see if we can help him with drug assistance program (costing > $100/month currently).

## 2019-05-19 NOTE — Assessment & Plan Note (Signed)
He is doing well with triumeq He needs PNVx23 but we are currently out.  He got flu vax.  Offered/refused condoms.  rtc in 9 months with labs prior.  We reviewed his labs today.

## 2019-05-20 ENCOUNTER — Telehealth: Payer: Self-pay | Admitting: Pharmacy Technician

## 2019-05-20 ENCOUNTER — Other Ambulatory Visit: Payer: Self-pay | Admitting: Infectious Diseases

## 2019-05-20 DIAGNOSIS — B20 Human immunodeficiency virus [HIV] disease: Secondary | ICD-10-CM

## 2019-05-20 NOTE — Telephone Encounter (Signed)
[facility] Patient Advocate Encounter  Patient expressed concerns about a copay for his Crestor during his office visit with Dr. Bobby Rumpf.  I reached out to [employer] to run the medication using the generic and for 30 days instead of 90 days, and by running it through his insurance that way, there is zero copay.  I left him a HIPPA compliant message regarding the medication and zero copay.  Venida Jarvis. Nadara Mustard Ross Patient [facility] Phone: [phone] Fax:  [phone]

## 2019-06-15 ENCOUNTER — Encounter: Payer: 59 | Admitting: Nurse Practitioner

## 2019-06-27 ENCOUNTER — Other Ambulatory Visit: Payer: Self-pay | Admitting: Endocrinology

## 2019-07-29 ENCOUNTER — Other Ambulatory Visit: Payer: Self-pay | Admitting: Family

## 2019-07-29 ENCOUNTER — Encounter: Payer: Self-pay | Admitting: Family

## 2019-07-29 ENCOUNTER — Ambulatory Visit (INDEPENDENT_AMBULATORY_CARE_PROVIDER_SITE_OTHER): Payer: 59 | Admitting: Family

## 2019-07-29 DIAGNOSIS — J069 Acute upper respiratory infection, unspecified: Secondary | ICD-10-CM | POA: Diagnosis not present

## 2019-07-29 NOTE — Progress Notes (Signed)
Brett Walsh is a 54 y.o. male with the following history as recorded in Epic[employer]:  Patient Active Problem List   Diagnosis Date Noted  . Mixed hyperlipidemia 06/11/2018  . Viral gastroenteritis 06/01/2018  . Morbid obesity ([place]) 02/02/2018  . Hyperlipidemia associated with type 2 diabetes mellitus ([place]) 02/02/2018  . Acute non intractable tension-type headache 11/12/2017  . Contusion of face 02/06/2017  . GERD (gastroesophageal reflux disease) 12/02/2016  . Medication reaction 10/14/2016  . Depression (emotion) 07/23/2016  . Insomnia 07/23/2016  . Routine general medical examination at a health care facility 06/20/2016  . TMJ (temporomandibular joint syndrome) 01/21/2016  . Slow transit constipation 08/16/2014  . HIV disease ([place]) 06/20/2014  . Diabetes type 2, uncontrolled ([place]) 06/20/2014    Current Outpatient Medications  Medication Sig Dispense Refill  . Blood Glucose Monitoring Suppl (ONE TOUCH ULTRA MINI) w/Device KIT Use meter to check blood sugars 1-4 times daily as instructed. 1 each 0  . chlorhexidine (PERIDEX) 0.12 % solution Use as directed 15 mLs in the mouth or throat 2 (two) times daily.    . Continuous Blood Gluc Sensor (FREESTYLE LIBRE 14 DAY SENSOR) MISC USE 1 SENSOR EVERY 14 DAYS 2 each 2  . famotidine (PEPCID) 20 MG tablet TAKE 1 TABLET(20 MG) BY MOUTH TWICE DAILY 60 tablet 1  . glucose blood (ONE TOUCH ULTRA TEST) test strip Use one strip per test. Test blood sugars 1-4 times daily as instructed. 100 each 12  . Insulin Pen Needle (B-D ULTRAFINE III SHORT PEN) 31G X 8 MM MISC USE AS DIRECTED AS NEEDED 100 each 5  . INVOKANA 300 MG TABS tablet TAKE 1 TABLET(300 MG) BY MOUTH DAILY BEFORE BREAKFAST 30 tablet 3  . Lancets Misc. (ONE TOUCH SURESOFT) MISC Use 1 lancet per test. Test blood sugars 1-4 times per day as instructed. 1 each 1  . loratadine (CLARITIN) 10 MG tablet Take 1 tablet (10 mg total) by mouth daily as needed for allergies. (Patient not taking:  Reported on 05/19/2019) 30 tablet 11  . repaglinide (PRANDIN) 1 MG tablet TAKE 1 TABLET(1 MG) BY MOUTH DAILY BEFORE SUPPER 30 tablet 1  . rosuvastatin (CRESTOR) 5 MG tablet TAKE 1 TABLET(5 MG) BY MOUTH DAILY 90 tablet 3  . triamcinolone (NASACORT) 55 MCG/ACT AERO nasal inhaler Place 2 sprays into the nose daily. (Patient not taking: Reported on 05/19/2019) 1 Inhaler 12  . TRIUMEQ 600-50-300 MG tablet TAKE 1 TABLET BY MOUTH DAILY 30 tablet 8   No current facility-administered medications for this visit.    Allergies: Metformin and related and Protonix [pantoprazole sodium]  Past Medical History:  Diagnosis Date  . Cataract   . Deviated septum   . Diabetes mellitus without complication ([place])   . Gallstones 10/2018  . HIV disease [facility] [place])     Past Surgical History:  Procedure Laterality Date  . NASAL SEPTUM SURGERY  1981, [id number]    Family History  Problem Relation Age of Onset  . Diabetes Brother   . Diabetes Mother   . Diabetes Father   . Heart failure Father   . Diabetes Sister   . Colon cancer Neg Hx   . Esophageal cancer Neg Hx   . Rectal cancer Neg Hx   . Stomach cancer Neg Hx     Social History   Tobacco Use  . Smoking status: Light Tobacco Smoker    Years: 36.00    Types: Cigarettes, E-cigarettes  . Smokeless tobacco: Former Systems developer  . Tobacco comment: 3-4/day  Substance Use Topics  . Alcohol use: Yes    Alcohol/week: 1.0 standard drinks    Types: 1 Standard drinks or equivalent per week    Comment: socially    Subjective:    I connected with [name] on 07/29/19 at  8:40 AM EST by a telephone call  and verified that I am speaking with the correct person using two identifiers.   I discussed the limitations of evaluation and management by telemedicine and the availability of in person appointments. The patient expressed understanding and agreed to proceed. Provider in office/ patient is at home; provider and patient are only 2 people on telephone call.  3  day history of cough/ congestion; actually driving to COVID testing site while he is on the phone call; using Claritin and Nasacort; still having problems with congestion; requesting work note for last 3 days of work. No chest pain or shortness of breath or fever;    Objective:  There were no vitals filed for this visit.  Lungs: Respirations unlabored;  Neurologic: Alert and oriented; speech intact;   Assessment:  1. Viral URI     Plan:  Patient is scheduled for COVID per his workplace requirement; continue Claritin and Nasacort AQ; can use OTC Sudafed for 3-5 days to help with congestion; work note given as requested.   Time spent 7 minutes  No follow-ups on file.  No orders of the defined types were placed in this encounter.   Requested Prescriptions    No prescriptions requested or ordered in this encounter

## 2019-07-31 ENCOUNTER — Other Ambulatory Visit: Payer: Self-pay | Admitting: Endocrinology

## 2019-08-03 ENCOUNTER — Other Ambulatory Visit: Payer: 59

## 2019-08-10 ENCOUNTER — Ambulatory Visit: Payer: 59 | Admitting: Endocrinology

## 2019-08-19 ENCOUNTER — Other Ambulatory Visit: Payer: Self-pay | Admitting: Endocrinology

## 2019-08-19 NOTE — Telephone Encounter (Signed)
Is this ok to refill?   Patient keeps canceling appointment after getting refill.

## 2019-08-19 NOTE — Telephone Encounter (Signed)
Okay to refill? 

## 2019-08-24 ENCOUNTER — Other Ambulatory Visit (INDEPENDENT_AMBULATORY_CARE_PROVIDER_SITE_OTHER): Payer: 59

## 2019-08-24 ENCOUNTER — Other Ambulatory Visit: Payer: Self-pay

## 2019-08-24 DIAGNOSIS — E669 Obesity, unspecified: Secondary | ICD-10-CM | POA: Diagnosis not present

## 2019-08-24 DIAGNOSIS — E1169 Type 2 diabetes mellitus with other specified complication: Secondary | ICD-10-CM | POA: Diagnosis not present

## 2019-08-24 LAB — HEMOGLOBIN A1C: Hgb A1c MFr Bld: 7.1 % — ABNORMAL HIGH (ref 4.6–6.5)

## 2019-08-24 LAB — COMPREHENSIVE METABOLIC PANEL
ALT: 26 U/L (ref 0–53)
AST: 18 U/L (ref 0–37)
Albumin: 4.2 g/dL (ref 3.5–5.2)
Alkaline Phosphatase: 66 U/L (ref 39–117)
BUN: 21 mg/dL (ref 6–23)
CO2: 24 mEq/L (ref 19–32)
Calcium: 9 mg/dL (ref 8.4–10.5)
Chloride: 104 mEq/L (ref 96–112)
Creatinine, Ser: 0.88 mg/dL (ref 0.40–1.50)
GFR: 91.06 mL/min (ref >60.00)
Glucose, Bld: 123 mg/dL — ABNORMAL HIGH (ref 70–99)
Potassium: 3.8 mEq/L (ref 3.5–5.1)
Sodium: 137 mEq/L (ref 135–145)
Total Bilirubin: 0.3 mg/dL (ref 0.2–1.2)
Total Protein: 7.7 g/dL (ref 6.0–8.3)

## 2019-09-05 ENCOUNTER — Other Ambulatory Visit: Payer: Self-pay

## 2019-09-06 ENCOUNTER — Encounter: Payer: Self-pay | Admitting: Endocrinology

## 2019-09-06 ENCOUNTER — Ambulatory Visit: Payer: 59 | Admitting: Endocrinology

## 2019-09-06 NOTE — Progress Notes (Deleted)
Patient ID: Brett Walsh, male   DOB: 21-Oct-1965, 54 y.o.   MRN: [id number]           Reason for Appointment: Follow-up for Type 2 Diabetes   History of Present Illness:          Date of diagnosis of type 2 diabetes mellitus: 2012       Background history:   At the time of diagnosis he weighed about 268 pounds His blood sugars were markedly increased at that time when he was hospitalized for treatment and sent home on insulin, metformin and Avandia He says that however he was able to lose weight with improved diet and was able to get off medications and insulin  He has been on various treatment since a couple of years later but detailed records are not available, moved from [place] His A1c has been as high as 14.5 in 2017 when he was prescribed Basaglar and Humalog although he does not remember this Subsequently his blood sugar control has been quite variable He was first started on metformin in 06/2016 and the dose was progressively increased  Recent history:    INSULIN regimen is: 0 units TRESIBA daily.  Humalog none   Non-insulin hypoglycemic drugs the patient is taking are: Invokana 300 mg daily  Most recent A1c is 6.9, unchanged  Current history of diabetes, recent management, blood sugar patterns and problems identified:  After his last visit he continued to reduce his [name]  With continuing Invokana and overall trying to watch his diet as well as increased activity he has lost significant amount of weight  About a month ago he was down to 20 units [place] and then because of the higher co-pay he did not renew that  His blood sugars are still mostly well controlled  Only has some hyperglycemia at times after dinner or lunch depending on his diet but on an average blood sugars are still within target after meals  He said that with working a 2nd job in the evening with increased activity is able to get some exercise which he was not doing  His freestyle  [name] is about 10 mg lower than lab glucose Fasting readings are mostly good except slightly higher if higher at night also       Side effects from medications have been: Diarrhea from high-dose metformin  Compliance with the medical regimen: Improving   Glucose monitoring:  done several times a day         Glucometer:  On recently freestyle libre  CGM use % of time  90  2-week average/SD  141, CV 21  Time in range      91%  % Time Above 180  eight  % Time above 250  one  % Time Below 70  zero     PRE-MEAL Fasting Lunch Dinner Bedtime Overall  Glucose range:       Averages:  124  139  130   141   POST-MEAL PC Breakfast PC Lunch PC Dinner  Glucose range:     Averages:  136  145  161      Self-care: The diet that the patient has been following is: tries to limit sugar, may occasionally have fried food.      Usually eating breakfast at 5 AM and dinner at 7-8 PM                Dietician visit, most recent: Never  Exercise:  Trying to walk a little otherwise has sedentary job  Weight history:  Wt Readings from Last 3 Encounters:  05/19/19 231 lb 12.8 oz (105.1 kg)  04/27/19 236 lb 6.4 oz (107.2 kg)  11/30/18 247 lb (112 kg)    Glycemic control:   Lab Results  Component Value Date   HGBA1C 7.1 (H) 08/24/2019   HGBA1C 6.9 (H) 04/25/2019   HGBA1C 6.9 (H) 12/20/2018   Lab Results  Component Value Date   MICROALBUR <0.7 04/27/2019   LDLCALC 129 (H) 04/20/2019   CREATININE 0.88 08/24/2019   Lab Results  Component Value Date   MICRALBCREAT 3.1 04/27/2019    Lab Results  Component Value Date   FRUCTOSAMINE 436 (H) 09/28/2017      Allergies as of 09/06/2019      Reactions   Metformin And Related    GI upset   Protonix [pantoprazole Sodium] Hives      Medication List       Accurate as of September 06, 2019  8:46 AM. If you have any questions, ask your nurse or doctor.        chlorhexidine 0.12 % solution Commonly known as: PERIDEX Use as  directed 15 mLs in the mouth or throat 2 (two) times daily.   famotidine 20 MG tablet Commonly known as: PEPCID TAKE 1 TABLET(20 MG) BY MOUTH TWICE DAILY   FreeStyle Libre 14 Day Sensor Misc USE 1 SENSOR EVERY 14 DAYS   glucose blood test strip Commonly known as: ONE TOUCH ULTRA TEST Use one strip per test. Test blood sugars 1-4 times daily as instructed.   Insulin Pen Needle 31G X 8 MM Misc Commonly known as: B-D ULTRAFINE III SHORT PEN USE AS DIRECTED AS NEEDED   Invokana 300 MG Tabs tablet Generic drug: canagliflozin TAKE 1 TABLET(300 MG) BY MOUTH DAILY BEFORE BREAKFAST   loratadine 10 MG tablet Commonly known as: CLARITIN Take 1 tablet (10 mg total) by mouth daily as needed for allergies.   ONE TOUCH SURESOFT Misc Use 1 lancet per test. Test blood sugars 1-4 times per day as instructed.   ONE TOUCH ULTRA MINI w/Device Kit Use meter to check blood sugars 1-4 times daily as instructed.   repaglinide 1 MG tablet Commonly known as: PRANDIN TAKE 1 TABLET(1 MG) BY MOUTH DAILY BEFORE SUPPER   rosuvastatin 5 MG tablet Commonly known as: CRESTOR TAKE 1 TABLET(5 MG) BY MOUTH DAILY   triamcinolone 55 MCG/ACT Aero nasal inhaler Commonly known as: NASACORT Place 2 sprays into the nose daily.   Triumeq 600-50-300 MG tablet Generic drug: abacavir-dolutegravir-lamiVUDine TAKE 1 TABLET BY MOUTH DAILY       Allergies:  Allergies  Allergen Reactions  . Metformin And Related     GI upset  . Protonix [Pantoprazole Sodium] Hives    Past Medical History:  Diagnosis Date  . Cataract   . Deviated septum   . Diabetes mellitus without complication ([place])   . Gallstones 10/2018  . HIV disease [facility])     Past Surgical History:  Procedure Laterality Date  . NASAL SEPTUM SURGERY  1981, [id number]    Family History  Problem Relation Age of Onset  . Diabetes Brother   . Diabetes Mother   . Diabetes Father   . Heart failure Father   . Diabetes Sister   . Colon cancer Neg  Hx   . Esophageal cancer Neg Hx   . Rectal cancer Neg Hx   . Stomach cancer Neg Hx  Social History:  reports that he has been smoking cigarettes and e-cigarettes. He has smoked for the past 36.00 years. He has quit using smokeless tobacco. He reports current alcohol use of about 1.0 standard drinks of alcohol per week. He reports that he does not use drugs.   Review of Systems  Lipid history: Has been on statin drugs with Crestor 5 mg daily since 2019 He ran out of Crestor because of the higher co-pay and has not gone back to it, LDL is much higher  Lab Results  Component Value Date   CHOL 195 04/20/2019   CHOL 109 09/14/2018   CHOL 118 07/21/2018   Lab Results  Component Value Date   HDL 37 (L) 04/20/2019   HDL 34.70 (L) 09/14/2018   HDL 38 (L) 07/21/2018   Lab Results  Component Value Date   LDLCALC 129 (H) 04/20/2019   LDLCALC 53 09/14/2018   LDLCALC 61 07/21/2018   Lab Results  Component Value Date   TRIG 171 (H) 04/20/2019   TRIG 108.0 09/14/2018   TRIG 107 07/21/2018   Lab Results  Component Value Date   CHOLHDL 5.3 (H) 04/20/2019   CHOLHDL 3 09/14/2018   CHOLHDL 3.1 07/21/2018   Lab Results  Component Value Date   LDLDIRECT 101.0 06/15/2018           He does have fatty liver on his imaging  Lab Results  Component Value Date   ALT 26 08/24/2019    Hypertension: No history of depression  BP Readings from Last 3 Encounters:  05/19/19 137/82  04/27/19 108/68  08/04/18 121/79    Most recent eye exam was in 10/18  Most recent foot exam: 08/2017    LABS:  No visits with results within 1 Week(s) from this visit.  Latest known visit with results is:  Lab on 08/24/2019  Component Date Value Ref Range Status  . Sodium 08/24/2019 137  135 - 145 mEq/L Final  . Potassium 08/24/2019 3.8  3.5 - 5.1 mEq/L Final  . Chloride 08/24/2019 104  96 - 112 mEq/L Final  . CO2 08/24/2019 24  19 - 32 mEq/L Final  . Glucose, Bld 08/24/2019 123* 70 - 99 mg/dL  Final  . BUN 08/24/2019 21  6 - 23 mg/dL Final  . Creatinine, Ser 08/24/2019 0.88  0.40 - 1.50 mg/dL Final  . Total Bilirubin 08/24/2019 0.3  0.2 - 1.2 mg/dL Final  . Alkaline Phosphatase 08/24/2019 66  39 - 117 U/L Final  . AST 08/24/2019 18  0 - 37 U/L Final  . ALT 08/24/2019 26  0 - 53 U/L Final  . Total Protein 08/24/2019 7.7  6.0 - 8.3 g/dL Final  . Albumin 08/24/2019 4.2  3.5 - 5.2 g/dL Final  . GFR 08/24/2019 91.06  >60.00 mL/min Final  . Calcium 08/24/2019 9.0  8.4 - 10.5 mg/dL Final  . Hgb A1c MFr Bld 08/24/2019 7.1* 4.6 - 6.5 % Final   Glycemic Control Guidelines for People with Diabetes:Non Diabetic:  <6%Goal of Therapy: <7%Additional Action Suggested:  >8%     Physical Examination:  There were no vitals taken for this visit.         ASSESSMENT:  Diabetes type 2, with obesity  See history of present illness for detailed discussion of current diabetes management, blood sugar patterns and problems identified  He has been insulin-dependent with marked increase in A1c in the past but now surprisingly his blood sugars are controlled even without any insulin  His blood sugars have been improved consistently now A1c is 6.9 again  He is doing well with Invokana Also losing weight with improved diet and increased activity Only when he is eating a large amount of carbohydrates or high fat foods he is getting blood sugars over 200 at times but not consistently He has difficulty affording some brand-name medications  LIPIDS: Previously good control with Crestor Discussed that he needs to stay on this since his LDL is much over 100 and he will check to see if he can afford the 90-day prescription  Follow blood pressure, initially high today likely to be from anxiety  Urine microalbumin to be checked today  Abnormal ALT: This is not consistent and will need to periodically follow, likely still from  PLAN:     Continue Invokana alone for now for his basic management  He  can use Prandin 1 mg on the days he is eating a larger meal or more carbohydrates especially during holiday dinners.  Discussed how this works and likely not needing it unless blood sugars are consistently higher postprandially  Also reconsider adding extended release Metformin if fasting readings are going higher  Encouraged him to stay active  Call if blood sugars are consistently above fasting or postprandial targets which were given to him  Restart Crestor    Overdue for eye exam and discussed need to have this done every 2 years at least, given him pamphlet on importance of eye exams and night diseases related to diabetes  There are no Patient Instructions on file for this visit.       Elayne Snare 09/06/2019, 8:46 AM   Note: This office note was prepared with Dragon voice recognition system technology. Any transcriptional errors that result from this process are unintentional.  This visit occurred during the SARS-CoV-2 public health emergency.  Safety protocols were in place, including screening questions prior to the visit, additional usage of staff PPE, and extensive cleaning of exam room while observing appropriate contact time as indicated for disinfecting solutions.

## 2019-09-08 ENCOUNTER — Other Ambulatory Visit: Payer: Self-pay

## 2019-09-08 ENCOUNTER — Encounter: Payer: Self-pay | Admitting: Endocrinology

## 2019-09-08 ENCOUNTER — Ambulatory Visit (INDEPENDENT_AMBULATORY_CARE_PROVIDER_SITE_OTHER): Payer: 59 | Admitting: Endocrinology

## 2019-09-08 VITALS — BP 120/80 | HR 98 | Ht 66.5 in | Wt 229.0 lb

## 2019-09-08 DIAGNOSIS — E782 Mixed hyperlipidemia: Secondary | ICD-10-CM | POA: Diagnosis not present

## 2019-09-08 DIAGNOSIS — E1169 Type 2 diabetes mellitus with other specified complication: Secondary | ICD-10-CM | POA: Diagnosis not present

## 2019-09-08 DIAGNOSIS — K760 Fatty (change of) liver, not elsewhere classified: Secondary | ICD-10-CM

## 2019-09-08 DIAGNOSIS — E1165 Type 2 diabetes mellitus with hyperglycemia: Secondary | ICD-10-CM

## 2019-09-08 DIAGNOSIS — E669 Obesity, unspecified: Secondary | ICD-10-CM

## 2019-09-08 DIAGNOSIS — E119 Type 2 diabetes mellitus without complications: Secondary | ICD-10-CM

## 2019-09-08 LAB — GLUCOSE, POCT (MANUAL RESULT ENTRY): POC Glucose: 148 mg/dl — AB (ref 70–99)

## 2019-09-08 MED ORDER — METFORMIN HCL ER 500 MG PO TB24: 1000.0000 mg | ORAL_TABLET | Freq: Every day | ORAL | 3 refills | Status: DC

## 2019-09-08 NOTE — Patient Instructions (Signed)
Need eye exam report  Prandin at 5.30 am  Metformin at lunch

## 2019-09-08 NOTE — Progress Notes (Signed)
Patient ID: Brett Walsh, male   DOB: 1966-03-14, 54 y.o.   MRN: [id number]           Reason for Appointment: Follow-up for Type 2 Diabetes   History of Present Illness:          Date of diagnosis of type 2 diabetes mellitus: 2012       Background history:   At the time of diagnosis he weighed about 268 pounds His blood sugars were markedly increased at that time when he was hospitalized for treatment and sent home on insulin, metformin and Avandia He says that however he was able to lose weight with improved diet and was able to get off medications and insulin  He has been on various treatment since a couple of years later but detailed records are not available, moved from [place] His A1c has been as high as 14.5 in 2017 when he was prescribed Basaglar and Humalog although he does not remember this Subsequently his blood sugar control has been quite variable He was first started on metformin in 06/2016 and the dose was progressively increased  Recent history:   Non-insulin hypoglycemic drugs the patient is taking are: Invokana 300 mg daily, Prandin 1 mg in the morning  Most recent A1c is 7.1 compared to 6.9  Current history of diabetes, recent management, blood sugar patterns and problems identified:  After his last visit he has started using Prandin for postprandial hyperglycemia as directed  He now is using this only at breakfast instead of at dinnertime since he has a smaller meal in the evening  Also tends to have higher readings after breakfast and this is despite taking the Prandin  However he says that he takes his Prandin couple of hours before his breakfast with his Invokana  Breakfast usually is eggs and toast and usually not high carbohydrate  Recently only has started doing a little walking and activities outside  However cut back on his calorie intake he is still losing some weight compared to his last visit in 11/20  Blood sugars were reviewed from  freestyle libre last month as he did not bring his meter today  Lab glucose 123 fasting but at home mostly in the 140-160 range  His freestyle [name] is on previous comparisons 10 mg lower than lab glucose, not doing fingersticks       Side effects from medications have been: Diarrhea from high-dose metformin  Compliance with the medical regimen: Improving   Glucose monitoring:  done several times a day Using freestyle libre   CONTINUOUS GLUCOSE MONITORING RECORD INTERPRETATION    Dates of Recording: 3/11 through 3/24  Sensor description: [employer]  Results statistics:   CGM use % of time  92  Average and SD  162+/-21  Time in range        72%  % Time Above 180  27  % Time above 250  1  % Time Below target  0    PRE-MEAL Fasting Lunch Dinner Bedtime Overall  Glucose range:       Mean/median:  158  155  149   162   POST-MEAL PC Breakfast PC Lunch PC Dinner  Glucose range:     Mean/median:  162  162  185    Glycemic patterns summary: Blood sugars are very stable most of the day with inconsistent rise in blood sugars after breakfast and lunch but more often after dinner although absolute rise in average blood sugar is usually under 40  No hypoglycemia Overnight blood sugars are very stable but relatively high  Hyperglycemic episodes are occurring variably after meals with highest reading 250 after breakfast and periodic increase after lunch and dinner  Hypoglycemic episodes not present  Overnight periods: Blood sugars are very stable and steady but generally in the 155-165 range  Preprandial periods: Average blood sugars about 140-160 as above  Postprandial periods:   After breakfast: He has a rise in blood sugar after the meal about 3 to 4 days a week After lunch: Usually blood sugars are not high except on weekends on occasion After dinner: Although blood sugars are not peaking significantly the are generally overall higher than any other time   Previous  data:  CGM use % of time  90  2-week average/SD  141, CV 21  Time in range      91%  % Time Above 180  eight  % Time above 250  one  % Time Below 70  zero     PRE-MEAL Fasting Lunch Dinner Bedtime Overall  Glucose range:       Averages:  124  139  130   141   POST-MEAL PC Breakfast PC Lunch PC Dinner  Glucose range:     Averages:  136  145  161      Self-care: The diet that the patient has been following is: tries to limit sugar, may occasionally have fried food.      Usually eating breakfast at 5 AM and dinner at 7-8 PM                Dietician visit, most recent: Never               Exercise:  Trying to walk a little otherwise has sedentary job  Weight history:  Wt Readings from Last 3 Encounters:  09/08/19 229 lb (103.9 kg)  05/19/19 231 lb 12.8 oz (105.1 kg)  04/27/19 236 lb 6.4 oz (107.2 kg)    Glycemic control:   Lab Results  Component Value Date   HGBA1C 7.1 (H) 08/24/2019   HGBA1C 6.9 (H) 04/25/2019   HGBA1C 6.9 (H) 12/20/2018   Lab Results  Component Value Date   MICROALBUR <0.7 04/27/2019   LDLCALC 129 (H) 04/20/2019   CREATININE 0.88 08/24/2019   Lab Results  Component Value Date   MICRALBCREAT 3.1 04/27/2019    Lab Results  Component Value Date   FRUCTOSAMINE 436 (H) 09/28/2017      Allergies as of 09/08/2019      Reactions   Metformin And Related    GI upset   Protonix [pantoprazole Sodium] Hives      Medication List       Accurate as of September 08, 2019 10:30 AM. If you have any questions, ask your nurse or doctor.        chlorhexidine 0.12 % solution Commonly known as: PERIDEX Use as directed 15 mLs in the mouth or throat 2 (two) times daily.   famotidine 20 MG tablet Commonly known as: PEPCID TAKE 1 TABLET(20 MG) BY MOUTH TWICE DAILY   FreeStyle Libre 14 Day Sensor Misc USE 1 SENSOR EVERY 14 DAYS   glucose blood test strip Commonly known as: ONE TOUCH ULTRA TEST Use one strip per test. Test blood sugars 1-4 times  daily as instructed.   Insulin Pen Needle 31G X 8 MM Misc Commonly known as: B-D ULTRAFINE III SHORT PEN USE AS DIRECTED AS NEEDED   Invokana 300 MG Tabs  tablet Generic drug: canagliflozin TAKE 1 TABLET(300 MG) BY MOUTH DAILY BEFORE BREAKFAST   loratadine 10 MG tablet Commonly known as: CLARITIN Take 1 tablet (10 mg total) by mouth daily as needed for allergies.   ONE TOUCH SURESOFT Misc Use 1 lancet per test. Test blood sugars 1-4 times per day as instructed.   ONE TOUCH ULTRA MINI w/Device Kit Use meter to check blood sugars 1-4 times daily as instructed.   repaglinide 1 MG tablet Commonly known as: PRANDIN TAKE 1 TABLET(1 MG) BY MOUTH DAILY BEFORE SUPPER   rosuvastatin 5 MG tablet Commonly known as: CRESTOR TAKE 1 TABLET(5 MG) BY MOUTH DAILY   triamcinolone 55 MCG/ACT Aero nasal inhaler Commonly known as: NASACORT Place 2 sprays into the nose daily.   Triumeq 600-50-300 MG tablet Generic drug: abacavir-dolutegravir-lamiVUDine TAKE 1 TABLET BY MOUTH DAILY       Allergies:  Allergies  Allergen Reactions  . Metformin And Related     GI upset  . Protonix [Pantoprazole Sodium] Hives    Past Medical History:  Diagnosis Date  . Cataract   . Deviated septum   . Diabetes mellitus without complication ([place])   . Gallstones 10/2018  . HIV disease [facility])     Past Surgical History:  Procedure Laterality Date  . NASAL SEPTUM SURGERY  1981, [id number]    Family History  Problem Relation Age of Onset  . Diabetes Brother   . Diabetes Mother   . Diabetes Father   . Heart failure Father   . Diabetes Sister   . Colon cancer Neg Hx   . Esophageal cancer Neg Hx   . Rectal cancer Neg Hx   . Stomach cancer Neg Hx     Social History:  reports that he has been smoking cigarettes and e-cigarettes. He has smoked for the past 36.00 years. He has quit using smokeless tobacco. He reports current alcohol use of about 1.0 standard drinks of alcohol per week. He reports that  he does not use drugs.   Review of Systems  Lipid history: Has been on statin drugs with Crestor 5 mg daily since 2019 He ran out of Crestor because of the higher co-pay previously but now is able to take this regularly  Needs follow-up labs  Lab Results  Component Value Date   CHOL 195 04/20/2019   CHOL 109 09/14/2018   CHOL 118 07/21/2018   Lab Results  Component Value Date   HDL 37 (L) 04/20/2019   HDL 34.70 (L) 09/14/2018   HDL 38 (L) 07/21/2018   Lab Results  Component Value Date   LDLCALC 129 (H) 04/20/2019   LDLCALC 53 09/14/2018   LDLCALC 61 07/21/2018   Lab Results  Component Value Date   TRIG 171 (H) 04/20/2019   TRIG 108.0 09/14/2018   TRIG 107 07/21/2018   Lab Results  Component Value Date   CHOLHDL 5.3 (H) 04/20/2019   CHOLHDL 3 09/14/2018   CHOLHDL 3.1 07/21/2018   Lab Results  Component Value Date   LDLDIRECT 101.0 06/15/2018           He does have fatty liver on his imaging studies  Lab Results  Component Value Date   ALT 26 08/24/2019    Hypertension: Not on medications, only on Invokana  BP Readings from Last 3 Encounters:  09/08/19 120/80  05/19/19 137/82  04/27/19 108/68    Most recent eye exam was in 2020 but report not available  Most recent foot exam: 08/2017  LABS:  No visits with results within 1 Week(s) from this visit.  Latest known visit with results is:  Lab on 08/24/2019  Component Date Value Ref Range Status  . Sodium 08/24/2019 137  135 - 145 mEq/L Final  . Potassium 08/24/2019 3.8  3.5 - 5.1 mEq/L Final  . Chloride 08/24/2019 104  96 - 112 mEq/L Final  . CO2 08/24/2019 24  19 - 32 mEq/L Final  . Glucose, Bld 08/24/2019 123* 70 - 99 mg/dL Final  . BUN 08/24/2019 21  6 - 23 mg/dL Final  . Creatinine, Ser 08/24/2019 0.88  0.40 - 1.50 mg/dL Final  . Total Bilirubin 08/24/2019 0.3  0.2 - 1.2 mg/dL Final  . Alkaline Phosphatase 08/24/2019 66  39 - 117 U/L Final  . AST 08/24/2019 18  0 - 37 U/L Final  . ALT  08/24/2019 26  0 - 53 U/L Final  . Total Protein 08/24/2019 7.7  6.0 - 8.3 g/dL Final  . Albumin 08/24/2019 4.2  3.5 - 5.2 g/dL Final  . GFR 08/24/2019 91.06  >60.00 mL/min Final  . Calcium 08/24/2019 9.0  8.4 - 10.5 mg/dL Final  . Hgb A1c MFr Bld 08/24/2019 7.1* 4.6 - 6.5 % Final   Glycemic Control Guidelines for People with Diabetes:Non Diabetic:  <6%Goal of Therapy: <7%Additional Action Suggested:  >8%     Physical Examination:  BP 120/80 (BP Location: Left Arm, Patient Position: Sitting, Cuff Size: Normal)   Pulse 98   Ht 5' 6.5" (1.689 m)   Wt 229 lb (103.9 kg)   SpO2 98%   BMI 36.41 kg/m          ASSESSMENT:  Diabetes type 2, with obesity  See history of present illness for detailed discussion of current diabetes management, blood sugar patterns and problems identified  He has been insulin-dependent with marked increase in A1c in the past but now surprisingly his blood sugars are controlled even without any insulin  His blood sugars have been generally improved although higher than on his last visit A1c is only slightly higher at 7.1 Mostly having high readings overnight and after evening meal compared to his last visit and this may be related to increasing insulin deficiency and resistance Also timing of Prandin is too early for his breakfast Although he previously had side effects from Metformin he has not tried Metformin ER and may have tolerated extended release previously in combination with Januvia   LIPIDS: Usually good control with Crestor LDL was higher last time with: Being out of his medication and will need to have this rechecked on the next visit   Abnormal ALT: Improved and likely fatty liver is better controlled with weight loss  PLAN:     Trial of Metformin ER along with his Invokana  He can take this at lunch starting with 500 mg and then increase to 1000 mg if tolerated  He needs to take his Prandin within 30 minutes of his planned breakfast  meals in the morning  Also can take it additionally eating a larger carbohydrate lunch or dinner  Consistent exercise  Continue daily Crestor  He will have reports of his eye exam sent to [place]   There are no Patient Instructions on file for this visit.       Elayne Snare 09/08/2019, 10:30 AM   Note: This office note was prepared with Dragon voice recognition system technology. Any transcriptional errors that result from this process are unintentional.  This visit occurred during the  SARS-CoV-2 public health emergency.  Safety protocols were in place, including screening questions prior to the visit, additional usage of staff PPE, and extensive cleaning of exam room while observing appropriate contact time as indicated for disinfecting solutions.

## 2019-09-28 ENCOUNTER — Other Ambulatory Visit: Payer: Self-pay | Admitting: Endocrinology

## 2019-10-16 ENCOUNTER — Other Ambulatory Visit: Payer: Self-pay | Admitting: Endocrinology

## 2019-11-02 ENCOUNTER — Other Ambulatory Visit: Payer: Self-pay | Admitting: Family

## 2019-11-25 ENCOUNTER — Other Ambulatory Visit: Payer: Self-pay | Admitting: Endocrinology

## 2019-12-01 ENCOUNTER — Other Ambulatory Visit (INDEPENDENT_AMBULATORY_CARE_PROVIDER_SITE_OTHER): Payer: 59

## 2019-12-01 ENCOUNTER — Other Ambulatory Visit: Payer: Self-pay

## 2019-12-01 ENCOUNTER — Other Ambulatory Visit: Payer: Self-pay | Admitting: Endocrinology

## 2019-12-01 DIAGNOSIS — E782 Mixed hyperlipidemia: Secondary | ICD-10-CM

## 2019-12-01 DIAGNOSIS — E1165 Type 2 diabetes mellitus with hyperglycemia: Secondary | ICD-10-CM | POA: Diagnosis not present

## 2019-12-01 LAB — COMPREHENSIVE METABOLIC PANEL
ALT: 19 U/L (ref 0–53)
AST: 15 U/L (ref 0–37)
Albumin: 4.4 g/dL (ref 3.5–5.2)
Alkaline Phosphatase: 66 U/L (ref 39–117)
BUN: 23 mg/dL (ref 6–23)
CO2: 26 mEq/L (ref 19–32)
Calcium: 9.3 mg/dL (ref 8.4–10.5)
Chloride: 102 mEq/L (ref 96–112)
Creatinine, Ser: 1.04 mg/dL (ref 0.40–1.50)
GFR: 75.01 mL/min (ref >60.00)
Glucose, Bld: 193 mg/dL — ABNORMAL HIGH (ref 70–99)
Potassium: 4 mEq/L (ref 3.5–5.1)
Sodium: 135 mEq/L (ref 135–145)
Total Bilirubin: 0.3 mg/dL (ref 0.2–1.2)
Total Protein: 7.8 g/dL (ref 6.0–8.3)

## 2019-12-01 LAB — LIPID PANEL
Cholesterol: 118 mg/dL (ref 0–200)
HDL: 37.8 mg/dL — ABNORMAL LOW (ref >39.00)
LDL Cholesterol: 57 mg/dL (ref 0–99)
NonHDL: 80.47
Total CHOL/HDL Ratio: 3
Triglycerides: 116 mg/dL (ref 0.0–149.0)
VLDL: 23.2 mg/dL (ref 0.0–40.0)

## 2019-12-01 LAB — HEMOGLOBIN A1C: Hgb A1c MFr Bld: 7.9 % — ABNORMAL HIGH (ref 4.6–6.5)

## 2019-12-08 ENCOUNTER — Other Ambulatory Visit: Payer: Self-pay | Admitting: *Deleted

## 2019-12-08 ENCOUNTER — Other Ambulatory Visit: Payer: Self-pay

## 2019-12-08 ENCOUNTER — Ambulatory Visit: Payer: 59 | Admitting: Endocrinology

## 2019-12-08 ENCOUNTER — Encounter: Payer: Self-pay | Admitting: Endocrinology

## 2019-12-08 VITALS — BP 138/82 | HR 94 | Ht 66.5 in | Wt 230.2 lb

## 2019-12-08 DIAGNOSIS — E782 Mixed hyperlipidemia: Secondary | ICD-10-CM

## 2019-12-08 DIAGNOSIS — E1165 Type 2 diabetes mellitus with hyperglycemia: Secondary | ICD-10-CM | POA: Diagnosis not present

## 2019-12-08 MED ORDER — FREESTYLE PRECISION NEO TEST VI STRP: ORAL_STRIP | 3 refills | Status: DC

## 2019-12-08 MED ORDER — OZEMPIC (0.25 OR 0.5 MG/DOSE) 2 MG/1.5ML ~~LOC~~ SOPN: 0.5000 mg | PEN_INJECTOR | SUBCUTANEOUS | 2 refills | Status: DC

## 2019-12-08 NOTE — Progress Notes (Signed)
Patient ID: Brett Walsh, male   DOB: 10-03-1965, 54 y.o.   MRN: [id number]           Reason for Appointment: Follow-up for Type 2 Diabetes   History of Present Illness:          Date of diagnosis of type 2 diabetes mellitus: 2012       Background history:   At the time of diagnosis he weighed about 268 pounds His blood sugars were markedly increased at that time when he was hospitalized for treatment and sent home on insulin, metformin and Avandia He says that however he was able to lose weight with improved diet and was able to get off medications and insulin  He has been on various treatment since a couple of years later but detailed records are not available, moved from [place] His A1c has been as high as 14.5 in 2017 when he was prescribed Basaglar and Humalog although he does not remember this Subsequently his blood sugar control has been quite variable He was first started on metformin in 06/2016 and the dose was progressively increased  Recent history:   Non-insulin hypoglycemic drugs the patient is taking are: Invokana 300 mg daily, Prandin 1 mg before breakfast and dinner, Metformin ER 1000 mg daily  Most recent A1c is 7.9, has been as low as 5.8   Current history of diabetes, recent management, blood sugar patterns and problems identified:  Because of his inconsistent control he was given a trial of Metformin  However he says he gets constipation with 500 mg twice daily of the Metformin ER, was having diarrhea from the regular Metformin in the past  Blood sugars are overall worse with averaging around 40 mg more than before  Also now with increased sweating during summer he is not able to keep the freestyle libre sensor on consistently  POSTPRANDIAL readings are not consistently high  He is not doing any formal exercise because of hot weather  He does not think he has changed his diet much and try not to eat out       Side effects from medications have  been: Diarrhea from high-dose metformin, constipation from Metformin ER  CGM use % of time  58  2-week average/SD  203, GV 19  Time in range      28%  % Time Above 180  60  % Time above 250  12  % Time Below 70 0     PRE-MEAL Fasting Lunch Dinner Bedtime Overall  Glucose range:       Averages:  180  189  204     POST-MEAL PC Breakfast PC Lunch PC Dinner  Glucose range:     Averages:  209  213  220     Previous data    CGM use % of time  92  Average and SD  162+/-21  Time in range        72%  % Time Above 180  27  % Time above 250  1  % Time Below target  0    PRE-MEAL Fasting Lunch Dinner Bedtime Overall  Glucose range:       Mean/median:  158  155  149   162   POST-MEAL PC Breakfast PC Lunch PC Dinner  Glucose range:     Mean/median:  162  162  185     Self-care: The diet that the patient has been following is: tries to limit sugar, may occasionally have fried  food.      Usually eating breakfast at 5 AM and dinner at 7-8 PM                Dietician visit, most recent: Never  Weight history:  Wt Readings from Last 3 Encounters:  12/08/19 230 lb 3.2 oz (104.4 kg)  09/08/19 229 lb (103.9 kg)  05/19/19 231 lb 12.8 oz (105.1 kg)    Glycemic control:   Lab Results  Component Value Date   HGBA1C 7.9 (H) 12/01/2019   HGBA1C 7.1 (H) 08/24/2019   HGBA1C 6.9 (H) 04/25/2019   Lab Results  Component Value Date   MICROALBUR <0.7 04/27/2019   LDLCALC 57 12/01/2019   CREATININE 1.04 12/01/2019   Lab Results  Component Value Date   MICRALBCREAT 3.1 04/27/2019    Lab Results  Component Value Date   FRUCTOSAMINE 436 (H) 09/28/2017      Allergies as of 12/08/2019      Reactions   Metformin And Related    GI upset   Protonix [pantoprazole Sodium] Hives      Medication List       Accurate as of December 08, 2019 11:45 AM. If you have any questions, ask your nurse or doctor.        STOP taking these medications   metFORMIN 500 MG 24 hr  tablet Commonly known as: GLUCOPHAGE-XR Stopped by: Elayne Snare, MD     TAKE these medications   chlorhexidine 0.12 % solution Commonly known as: PERIDEX Use as directed 15 mLs in the mouth or throat 2 (two) times daily.   famotidine 20 MG tablet Commonly known as: PEPCID TAKE 1 TABLET(20 MG) BY MOUTH TWICE DAILY   FreeStyle Libre 14 Day Sensor Misc USE 1 SENSOR EVERY 14 DAYS   glucose blood test strip Commonly known as: ONE TOUCH ULTRA TEST Use one strip per test. Test blood sugars 1-4 times daily as instructed. What changed: Another medication with the same name was changed. Make sure you understand how and when to take each. Changed by: Glori Bickers, CMA   FreeStyle Precision Neo Test test strip Generic drug: glucose blood Use Freestyle Neo test strips to test blood sugars twice daily What changed:   how much to take  how to take this  when to take this Changed by: Glori Bickers, CMA   Insulin Pen Needle 31G X 8 MM Misc Commonly known as: B-D ULTRAFINE III SHORT PEN USE AS DIRECTED AS NEEDED   Invokana 300 MG Tabs tablet Generic drug: canagliflozin TAKE 1 TABLET(300 MG) BY MOUTH DAILY BEFORE BREAKFAST   loratadine 10 MG tablet Commonly known as: CLARITIN Take 1 tablet (10 mg total) by mouth daily as needed for allergies.   ONE TOUCH SURESOFT Misc Use 1 lancet per test. Test blood sugars 1-4 times per day as instructed.   ONE TOUCH ULTRA MINI w/Device Kit Use meter to check blood sugars 1-4 times daily as instructed.   Ozempic (0.25 or 0.5 MG/DOSE) 2 MG/1.5ML Sopn Generic drug: Semaglutide(0.25 or 0.5MG/DOS) Inject 0.375 mLs (0.5 mg total) into the skin once a week. Started by: Elayne Snare, MD   repaglinide 1 MG tablet Commonly known as: PRANDIN TAKE 1 TABLET(1 MG) BY MOUTH DAILY BEFORE SUPPER   rosuvastatin 5 MG tablet Commonly known as: CRESTOR TAKE 1 TABLET(5 MG) BY MOUTH DAILY   triamcinolone 55 MCG/ACT Aero nasal inhaler Commonly known  as: NASACORT Place 2 sprays into the nose daily.   Triumeq [id number]  MG tablet Generic drug: abacavir-dolutegravir-lamiVUDine TAKE 1 TABLET BY MOUTH DAILY       Allergies:  Allergies  Allergen Reactions  . Metformin And Related     GI upset  . Protonix [Pantoprazole Sodium] Hives    Past Medical History:  Diagnosis Date  . Cataract   . Deviated septum   . Diabetes mellitus without complication ([place])   . Gallstones 10/2018  . HIV disease [facility])     Past Surgical History:  Procedure Laterality Date  . NASAL SEPTUM SURGERY  1981, [id number]    Family History  Problem Relation Age of Onset  . Diabetes Brother   . Diabetes Mother   . Diabetes Father   . Heart failure Father   . Diabetes Sister   . Colon cancer Neg Hx   . Esophageal cancer Neg Hx   . Rectal cancer Neg Hx   . Stomach cancer Neg Hx     Social History:  reports that he has been smoking cigarettes and e-cigarettes. He has smoked for the past 36.00 years. He has quit using smokeless tobacco. He reports current alcohol use of about 1.0 standard drink of alcohol per week. He reports that he does not use drugs.   Review of Systems  Lipid history: Has been on statin drugs with Crestor 5 mg daily since 2019 Lipids are improved  Lab Results  Component Value Date   CHOL 118 12/01/2019   CHOL 195 04/20/2019   CHOL 109 09/14/2018   Lab Results  Component Value Date   HDL 37.80 (L) 12/01/2019   HDL 37 (L) 04/20/2019   HDL 34.70 (L) 09/14/2018   Lab Results  Component Value Date   LDLCALC 57 12/01/2019   LDLCALC 129 (H) 04/20/2019   LDLCALC 53 09/14/2018   Lab Results  Component Value Date   TRIG 116.0 12/01/2019   TRIG 171 (H) 04/20/2019   TRIG 108.0 09/14/2018   Lab Results  Component Value Date   CHOLHDL 3 12/01/2019   CHOLHDL 5.3 (H) 04/20/2019   CHOLHDL 3 09/14/2018   Lab Results  Component Value Date   LDLDIRECT 101.0 06/15/2018           He has history of fatty liver on his  imaging studies  Lab Results  Component Value Date   ALT 19 12/01/2019    No history of hypertension:  BP Readings from Last 3 Encounters:  12/08/19 138/82  09/08/19 120/80  05/19/19 137/82    Most recent eye exam was in 2020 but report not available  Most recent foot exam: 08/2017    LABS:  No visits with results within 1 Week(s) from this visit.  Latest known visit with results is:  Lab on 12/01/2019  Component Date Value Ref Range Status  . Cholesterol 12/01/2019 118  0 - 200 mg/dL Final   ATP III Classification       Desirable:  < 200 mg/dL               Borderline High:  200 - 239 mg/dL          High:  > = 240 mg/dL  . Triglycerides 12/01/2019 116.0  0 - 149 mg/dL Final   Normal:  <150 mg/dLBorderline High:  150 - 199 mg/dL  . HDL 12/01/2019 37.80* >39.00 mg/dL Final  . VLDL 12/01/2019 23.2  0.0 - 40.0 mg/dL Final  . LDL Cholesterol 12/01/2019 57  0 - 99 mg/dL Final  . Total CHOL/HDL Ratio 12/01/2019 3  Final                  Men          Women1/2 Average Risk     3.4          3.3Average Risk          5.0          4.42X Average Risk          9.6          7.13X Average Risk          15.0          11.0                      . NonHDL 12/01/2019 80.47   Final   NOTE:  Non-HDL goal should be 30 mg/dL higher than patient's LDL goal (i.e. LDL goal of < 70 mg/dL, would have non-HDL goal of < 100 mg/dL)  . Sodium 12/01/2019 135  135 - 145 mEq/L Final  . Potassium 12/01/2019 4.0  3.5 - 5.1 mEq/L Final  . Chloride 12/01/2019 102  96 - 112 mEq/L Final  . CO2 12/01/2019 26  19 - 32 mEq/L Final  . Glucose, Bld 12/01/2019 193* 70 - 99 mg/dL Final  . BUN 12/01/2019 23  6 - 23 mg/dL Final  . Creatinine, Ser 12/01/2019 1.04  0.40 - 1.50 mg/dL Final  . Total Bilirubin 12/01/2019 0.3  0.2 - 1.2 mg/dL Final  . Alkaline Phosphatase 12/01/2019 66  39 - 117 U/L Final  . AST 12/01/2019 15  0 - 37 U/L Final  . ALT 12/01/2019 19  0 - 53 U/L Final  . Total Protein 12/01/2019 7.8  6.0 - 8.3  g/dL Final  . Albumin 12/01/2019 4.4  3.5 - 5.2 g/dL Final  . GFR 12/01/2019 75.01  >60.00 mL/min Final  . Calcium 12/01/2019 9.3  8.4 - 10.5 mg/dL Final  . Hgb A1c MFr Bld 12/01/2019 7.9* 4.6 - 6.5 % Final   Glycemic Control Guidelines for People with Diabetes:Non Diabetic:  <6%Goal of Therapy: <7%Additional Action Suggested:  >8%     Physical Examination:  BP 138/82 (BP Location: Left Arm, Patient Position: Sitting, Cuff Size: Normal)   Pulse 94   Ht 5' 6.5" (1.689 m)   Wt 230 lb 3.2 oz (104.4 kg)   SpO2 100%   BMI 36.60 kg/m          ASSESSMENT:  Diabetes type 2, with obesity  See history of present illness for detailed discussion of current diabetes management, blood sugar patterns and problems identified  He has been insulin-dependent with marked increase in A1c in the past  Although he may be somewhat insulin-deficient his blood sugars are averaging about 200 including high readings overnight without much postprandial hyperglycemia unless he is eating more carbohydrate Also can do better with exercise regimen With his abdominal obesity would prefer not to start insulin and may benefit better from a GLP-1 drug and this was explained  LIPIDS: Now has good control with Crestor with taking it regularly and needs to continue indefinitely   PLAN:     Stop Metformin  Try Ozempic in addition to his Invokana and Prandin Explained to the patient what GLP-1 drugs are, the sites of actions and the body, reduction of hunger sensation and improved insulin secretion.  Discussed the benefit of weight loss with these medications. Explained possible side effects of OZEMPIC, most commonly nausea  that usually improves over time.  Also explained safety information associated with the medication Demonstrated the medication injection device and injection technique to the patient.  To start the injections with the 0.25 mg dosage weekly for the first 4 injections and then go up to 0.5 mg  weekly if no continued nausea He will call if he has any difficulties with the medication  Patient brochure on Ozempic with co-pay card given  We will need short-term follow-up  Consider consultation with dietitian  Start regular exercise program with brisk walking, he can do this at the cooler times of the day  Given him information on how to improve the addition of the freestyle libre sensor, preferably get armband from [facility]   Patient Instructions  Explained to the patient what GLP-1 drugs are, the sites of actions and the body, reduction of hunger sensation and improved insulin secretion.  Discussed the benefit of weight loss with these medications. Explained possible side effects of OZEMPIC, most commonly nausea that usually improves over time.  Also explained safety information associated with the medication Demonstrated the medication injection device and injection technique to the patient.  To start the injections with the 0.25 mg dosage weekly for the first 4 injections and then go up to 0.5 mg weekly if no continued nausea  Patient brochure on Ozempic with enclosed co-pay card given   [place] website: click on support and look at bottom  Walk daily  Stop Metformin        Elayne Snare 12/08/2019, 11:45 AM   Note: This office note was prepared with Dragon voice recognition system technology. Any transcriptional errors that result from this process are unintentional.  This visit occurred during the SARS-CoV-2 public health emergency.  Safety protocols were in place, including screening questions prior to the visit, additional usage of staff PPE, and extensive cleaning of exam room while observing appropriate contact time as indicated for disinfecting solutions.

## 2019-12-08 NOTE — Patient Instructions (Addendum)
Explained to the patient what GLP-1 drugs are, the sites of actions and the body, reduction of hunger sensation and improved insulin secretion.  Discussed the benefit of weight loss with these medications. Explained possible side effects of OZEMPIC, most commonly nausea that usually improves over time.  Also explained safety information associated with the medication Demonstrated the medication injection device and injection technique to the patient.  To start the injections with the 0.25 mg dosage weekly for the first 4 injections and then go up to 0.5 mg weekly if no continued nausea  Patient brochure on Ozempic with enclosed co-pay card given   [place] website: click on support and look at bottom  Walk daily  Stop Metformin

## 2019-12-12 ENCOUNTER — Other Ambulatory Visit: Payer: Self-pay | Admitting: Endocrinology

## 2019-12-29 ENCOUNTER — Other Ambulatory Visit: Payer: Self-pay | Admitting: Endocrinology

## 2019-12-29 ENCOUNTER — Encounter: Payer: Self-pay | Admitting: Internal Medicine

## 2019-12-29 ENCOUNTER — Telehealth (INDEPENDENT_AMBULATORY_CARE_PROVIDER_SITE_OTHER): Payer: 59 | Admitting: Internal Medicine

## 2019-12-29 DIAGNOSIS — A084 Viral intestinal infection, unspecified: Secondary | ICD-10-CM

## 2019-12-29 DIAGNOSIS — E11649 Type 2 diabetes mellitus with hypoglycemia without coma: Secondary | ICD-10-CM | POA: Diagnosis not present

## 2019-12-29 DIAGNOSIS — E1165 Type 2 diabetes mellitus with hyperglycemia: Secondary | ICD-10-CM

## 2019-12-29 MED ORDER — ONDANSETRON HCL 4 MG PO TABS: 4.0000 mg | ORAL_TABLET | Freq: Three times a day (TID) | ORAL | 1 refills | Status: DC | PRN

## 2019-12-29 NOTE — Progress Notes (Signed)
Patient ID: Brett Walsh, male   DOB: [birth year], 54 y.o.   MRN: [id number]  Virtual Visit via Video Note  I connected with Brett Walsh on 12/29/19 at  1:40 PM EDT by a video enabled telemedicine application and verified that I am speaking with the correct person using two identifiers.  Location of all participants today Patient: at home Provider: at office   I discussed the limitations of evaluation and management by telemedicine and the availability of in person appointments. The patient expressed understanding and agreed to proceed.  History of Present Illness: Here with c/o 3-4 daysonset intermittent nausea, HA and diarrhea now improved again today, but has all overbody aches and fatigue as well, no high fever, chills, and Denies worsening reflux, abd pain, dysphagia, or blood.  Pt denies chest pain, increased sob or doe, wheezing, orthopnea, PND, increased LE swelling, palpitations, dizziness or syncope.  No URI symptoms.  Pt denies new neurological symptoms such as new headache, or facial or extremity weakness or numbness   Pt denies polydipsia, polyuria,  Past Medical History:  Diagnosis Date  . Cataract   . Deviated septum   . Diabetes mellitus without complication ([place])   . Gallstones 10/2018  . HIV disease [facility])    Past Surgical History:  Procedure Laterality Date  . [place], [id number]    reports that he has been smoking cigarettes and e-cigarettes. He has smoked for the past 36.00 years. He has quit using smokeless tobacco. He reports current alcohol use of about 1.0 standard drink of alcohol per week. He reports that he does not use drugs. family history includes Diabetes in his brother, father, mother, and sister; Heart failure in his father. Allergies  Allergen Reactions  . Metformin And Related     GI upset  . Protonix [Pantoprazole Sodium] Hives   Current Outpatient Medications on File Prior to Visit  Medication Sig Dispense Refill  . Blood  Glucose Monitoring Suppl (ONE TOUCH ULTRA MINI) w/Device KIT Use meter to check blood sugars 1-4 times daily as instructed. 1 each 0  . chlorhexidine (PERIDEX) 0.12 % solution Use as directed 15 mLs in the mouth or throat 2 (two) times daily.    . Continuous Blood Gluc Sensor (FREESTYLE LIBRE 14 DAY SENSOR) MISC USE 1 SENSOR EVERY 14 DAYS 2 each 1  . famotidine (PEPCID) 20 MG tablet TAKE 1 TABLET(20 MG) BY MOUTH TWICE DAILY 60 tablet 1  . glucose blood (FREESTYLE PRECISION NEO TEST) test strip Use Freestyle Neo test strips to test blood sugars twice daily 100 each 3  . glucose blood (ONE TOUCH ULTRA TEST) test strip Use one strip per test. Test blood sugars 1-4 times daily as instructed. 100 each 12  . Insulin Pen Needle (B-D ULTRAFINE III SHORT PEN) 31G X 8 MM MISC USE AS DIRECTED AS NEEDED 100 each 5  . INVOKANA 300 MG TABS tablet TAKE 1 TABLET(300 MG) BY MOUTH DAILY BEFORE BREAKFAST 30 tablet 3  . Lancets Misc. (ONE TOUCH SURESOFT) MISC Use 1 lancet per test. Test blood sugars 1-4 times per day as instructed. 1 each 1  . loratadine (CLARITIN) 10 MG tablet Take 1 tablet (10 mg total) by mouth daily as needed for allergies. 30 tablet 11  . metFORMIN (GLUCOPHAGE-XR) 500 MG 24 hr tablet TAKE 2 TABLETS(1000 MG) BY MOUTH DAILY WITH LUNCH 60 tablet 3  . repaglinide (PRANDIN) 1 MG tablet TAKE 1 TABLET(1 MG) BY MOUTH DAILY BEFORE SUPPER 30 tablet 1  .  rosuvastatin (CRESTOR) 5 MG tablet TAKE 1 TABLET(5 MG) BY MOUTH DAILY 90 tablet 3  . Semaglutide,0.25 or 0.5MG/DOS, (OZEMPIC, 0.25 OR 0.5 MG/DOSE,) 2 MG/1.5ML SOPN Inject 0.375 mLs (0.5 mg total) into the skin once a week. 1 pen 2  . triamcinolone (NASACORT) 55 MCG/ACT AERO nasal inhaler Place 2 sprays into the nose daily. 1 Inhaler 12  . TRIUMEQ 600-50-300 MG tablet TAKE 1 TABLET BY MOUTH DAILY 30 tablet 8   No current facility-administered medications on file prior to visit.    Observations/Objective: Alert, NAD, appropriate mood and affect, resps  normal, cn 2-12 intact, moves all 4s, no visible rash or swelling Lab Results  Component Value Date   WBC 14.4 (H) 04/20/2019   HGB 14.8 04/20/2019   HCT 43.2 04/20/2019   PLT 311 04/20/2019   GLUCOSE 193 (H) 12/01/2019   CHOL 118 12/01/2019   TRIG 116.0 12/01/2019   HDL 37.80 (L) 12/01/2019   LDLDIRECT 101.0 06/15/2018   LDLCALC 57 12/01/2019   ALT 19 12/01/2019   AST 15 12/01/2019   NA 135 12/01/2019   K 4.0 12/01/2019   CL 102 12/01/2019   CREATININE 1.04 12/01/2019   BUN 23 12/01/2019   CO2 26 12/01/2019   TSH 2.25 06/12/2017   PSA 0.46 06/11/2018   HGBA1C 7.9 (H) 12/01/2019   MICROALBUR <0.7 04/27/2019   Assessment and Plan: See notes  Follow Up Instructions: See notes   I discussed the assessment and treatment plan with the patient. The patient was provided an opportunity to ask questions and all were answered. The patient agreed with the plan and demonstrated an understanding of the instructions.   The patient was advised to call back or seek an in-person evaluation if the symptoms worsen or if the condition fails to improve as anticipated.   Cathlean Cower, MD

## 2020-01-01 ENCOUNTER — Encounter: Payer: Self-pay | Admitting: Internal Medicine

## 2020-01-01 NOTE — Assessment & Plan Note (Signed)
stable overall by history and exam, recent data reviewed with pt, and pt to continue medical treatment as before,  to f/u any worsening symptoms or concerns  

## 2020-01-01 NOTE — Assessment & Plan Note (Addendum)
Suspect viral illness, improved today, cant r/o covid infection, for zofran prn, and covid testing  I spent 21 minutes in preparing to see the patient by review of recent labs, imaging and procedures, obtaining and reviewing separately obtained history, communicating with the patient and family or caregiver, ordering medications, tests or procedures, and documenting clinical information in the EHR including the differential Dx, treatment, and any further evaluation and other management of viral illness, dm

## 2020-01-01 NOTE — Patient Instructions (Signed)
Please take all new medication as prescribed  Please go for covid testing today

## 2020-01-09 ENCOUNTER — Ambulatory Visit: Payer: 59 | Admitting: Endocrinology

## 2020-01-13 ENCOUNTER — Other Ambulatory Visit: Payer: Self-pay | Admitting: Endocrinology

## 2020-01-30 ENCOUNTER — Other Ambulatory Visit: Payer: Self-pay | Admitting: Infectious Diseases

## 2020-01-30 ENCOUNTER — Other Ambulatory Visit: Payer: Self-pay | Admitting: Endocrinology

## 2020-01-30 DIAGNOSIS — B20 Human immunodeficiency virus [HIV] disease: Secondary | ICD-10-CM

## 2020-02-02 ENCOUNTER — Other Ambulatory Visit: Payer: Self-pay

## 2020-02-02 ENCOUNTER — Other Ambulatory Visit (INDEPENDENT_AMBULATORY_CARE_PROVIDER_SITE_OTHER): Payer: 59

## 2020-02-02 DIAGNOSIS — E1165 Type 2 diabetes mellitus with hyperglycemia: Secondary | ICD-10-CM | POA: Diagnosis not present

## 2020-02-02 LAB — GLUCOSE, RANDOM: Glucose, Bld: 107 mg/dL — ABNORMAL HIGH (ref 70–99)

## 2020-02-03 LAB — FRUCTOSAMINE: Fructosamine: 263 umol/L (ref 0–285)

## 2020-02-07 ENCOUNTER — Other Ambulatory Visit: Payer: Self-pay

## 2020-02-07 ENCOUNTER — Encounter: Payer: Self-pay | Admitting: Endocrinology

## 2020-02-07 ENCOUNTER — Encounter: Payer: Self-pay | Admitting: Physician Assistant

## 2020-02-07 ENCOUNTER — Telehealth: Payer: 59 | Admitting: Physician Assistant

## 2020-02-07 ENCOUNTER — Ambulatory Visit: Payer: 59 | Admitting: Endocrinology

## 2020-02-07 VITALS — BP 140/80 | HR 94 | Ht 66.5 in | Wt 221.8 lb

## 2020-02-07 DIAGNOSIS — E1169 Type 2 diabetes mellitus with other specified complication: Secondary | ICD-10-CM | POA: Diagnosis not present

## 2020-02-07 DIAGNOSIS — H1032 Unspecified acute conjunctivitis, left eye: Secondary | ICD-10-CM

## 2020-02-07 DIAGNOSIS — E1165 Type 2 diabetes mellitus with hyperglycemia: Secondary | ICD-10-CM | POA: Diagnosis not present

## 2020-02-07 DIAGNOSIS — E669 Obesity, unspecified: Secondary | ICD-10-CM

## 2020-02-07 DIAGNOSIS — Z794 Long term (current) use of insulin: Secondary | ICD-10-CM

## 2020-02-07 LAB — POCT GLUCOSE (DEVICE FOR HOME USE): Glucose Fasting, POC: 194 mg/dL — AB (ref 70–99)

## 2020-02-07 MED ORDER — POLYMYXIN B-TRIMETHOPRIM 10000-0.1 UNIT/ML-% OP SOLN: 1.0000 [drp] | OPHTHALMIC | 0 refills | Status: DC

## 2020-02-07 NOTE — Progress Notes (Signed)
Patient ID: Brett Walsh, male   DOB: 1966-03-26, 54 y.o.   MRN: [id number]           Reason for Appointment: Follow-up for Type 2 Diabetes   History of Present Illness:          Date of diagnosis of type 2 diabetes mellitus: 2012       Background history:   At the time of diagnosis he weighed about 268 pounds His blood sugars were markedly increased at that time when he was hospitalized for treatment and sent home on insulin, metformin and Avandia He says that however he was able to lose weight with improved diet and was able to get off medications and insulin  He has been on various treatment since a couple of years later but detailed records are not available, moved from [place] His A1c has been as high as 14.5 in 2017 when he was prescribed Basaglar and Humalog although he does not remember this Subsequently his blood sugar control has been quite variable He was first started on metformin in 06/2016 and the dose was progressively increased  Recent history:   Non-insulin hypoglycemic drugs: Invokana 300 mg daily, Prandin 1 mg before breakfast and dinner, Metformin 500 mg twice daily Ozempic 0.5 mg weekly  Most recent A1c is 7.9, has been as low as 5.8 Fructosamine is 263 now  Current history of diabetes, recent management, blood sugar patterns and problems identified:  In 7/21 his blood sugars were significantly higher and averaging about 200  He was started on Ozempic which he has taken every Saturday  Although he had significant nausea the first 2 weeks subsequently nausea has been much better except a little on the first and second days of the injection  He did move up the dose to 0.5 mg after third injection on his own  Overall blood sugars are significantly better but his monitoring has been limited  He has lost 9 pounds from increased satiety  Currently he is not able to use the freestyle libre consistently because of the sensor falling off including when  he took a shower and was not wearing his arm band; his sensor data is available for about 4 days last month  Today his blood sugar is significantly high but he thinks this is from eating high-fat meals like lasagna last night  He only gets much exercise when he is doing lawnmowing and some walking but no formal exercise  He has continued his Metformin even though he thinks it has caused some constipation        Side effects from medications have been: Diarrhea from high-dose metformin, constipation from Metformin ER  CGM use % of time  20  2-week average/SD  128  Time in range     95   %  % Time Above 180   % Time above 250   % Time Below 70      PRE-MEAL Fasting Lunch Dinner Bedtime Overall  Glucose range:       Averages:  147  133  134  123  128   Previous data:  CGM use % of time  58  2-week average/SD  203, GV 19  Time in range      28%  % Time Above 180  60  % Time above 250  12  % Time Below 70 0     PRE-MEAL Fasting Lunch Dinner Bedtime Overall  Glucose range:       Averages:  180  189  204     POST-MEAL PC Breakfast PC Lunch PC Dinner  Glucose range:     Averages:  209  213  220      Self-care: The diet that the patient has been following is: tries to limit sugar, may occasionally have fried food.      Usually eating breakfast at 5 AM and dinner at 7-8 PM                Dietician visit, most recent: Never  Weight history:  Wt Readings from Last 3 Encounters:  02/07/20 221 lb 12.8 oz (100.6 kg)  12/08/19 230 lb 3.2 oz (104.4 kg)  09/08/19 229 lb (103.9 kg)    Glycemic control:   Lab Results  Component Value Date   HGBA1C 7.9 (H) 12/01/2019   HGBA1C 7.1 (H) 08/24/2019   HGBA1C 6.9 (H) 04/25/2019   Lab Results  Component Value Date   MICROALBUR <0.7 04/27/2019   LDLCALC 57 12/01/2019   CREATININE 1.04 12/01/2019   Lab Results  Component Value Date   MICRALBCREAT 3.1 04/27/2019    Lab Results  Component Value Date   FRUCTOSAMINE 263  02/02/2020   FRUCTOSAMINE 436 (H) 09/28/2017      Allergies as of 02/07/2020      Reactions   Metformin And Related    GI upset   Protonix [pantoprazole Sodium] Hives      Medication List       Accurate as of February 07, 2020  8:51 AM. If you have any questions, ask your nurse or doctor.        chlorhexidine 0.12 % solution Commonly known as: PERIDEX Use as directed 15 mLs in the mouth or throat 2 (two) times daily.   famotidine 20 MG tablet Commonly known as: PEPCID TAKE 1 TABLET(20 MG) BY MOUTH TWICE DAILY   FreeStyle Libre 14 Day Sensor Misc USE 1 SENSOR EVERY 14 DAYS   glucose blood test strip Commonly known as: ONE TOUCH ULTRA TEST Use one strip per test. Test blood sugars 1-4 times daily as instructed.   FreeStyle Precision Neo Test test strip Generic drug: glucose blood Use Freestyle Neo test strips to test blood sugars twice daily   Insulin Pen Needle 31G X 8 MM Misc Commonly known as: B-D ULTRAFINE III SHORT PEN USE AS DIRECTED AS NEEDED   Invokana 300 MG Tabs tablet Generic drug: canagliflozin TAKE 1 TABLET(300 MG) BY MOUTH DAILY BEFORE BREAKFAST   loratadine 10 MG tablet Commonly known as: CLARITIN Take 1 tablet (10 mg total) by mouth daily as needed for allergies.   metFORMIN 500 MG 24 hr tablet Commonly known as: GLUCOPHAGE-XR TAKE 2 TABLETS(1000 MG) BY MOUTH DAILY WITH LUNCH   ondansetron 4 MG tablet Commonly known as: Zofran Take 1 tablet (4 mg total) by mouth every 8 (eight) hours as needed for nausea or vomiting.   ONE TOUCH SURESOFT Misc Use 1 lancet per test. Test blood sugars 1-4 times per day as instructed.   ONE TOUCH ULTRA MINI w/Device Kit Use meter to check blood sugars 1-4 times daily as instructed.   Ozempic (0.25 or 0.5 MG/DOSE) 2 MG/1.5ML Sopn Generic drug: Semaglutide(0.25 or 0.5MG/DOS) Inject 0.375 mLs (0.5 mg total) into the skin once a week.   repaglinide 1 MG tablet Commonly known as: PRANDIN TAKE 1 TABLET(1 MG)  BY MOUTH DAILY BEFORE SUPPER   rosuvastatin 5 MG tablet Commonly known as: CRESTOR TAKE 1 TABLET(5 MG) BY MOUTH DAILY  triamcinolone 55 MCG/ACT Aero nasal inhaler Commonly known as: NASACORT Place 2 sprays into the nose daily.   Triumeq 600-50-300 MG tablet Generic drug: abacavir-dolutegravir-lamiVUDine TAKE 1 TABLET BY MOUTH DAILY       Allergies:  Allergies  Allergen Reactions  . Metformin And Related     GI upset  . Protonix [Pantoprazole Sodium] Hives    Past Medical History:  Diagnosis Date  . Cataract   . Deviated septum   . Diabetes mellitus without complication ([place])   . Gallstones 10/2018  . HIV disease [facility])     Past Surgical History:  Procedure Laterality Date  . NASAL SEPTUM SURGERY  1981, [id number]    Family History  Problem Relation Age of Onset  . Diabetes Brother   . Diabetes Mother   . Diabetes Father   . Heart failure Father   . Diabetes Sister   . Colon cancer Neg Hx   . Esophageal cancer Neg Hx   . Rectal cancer Neg Hx   . Stomach cancer Neg Hx     Social History:  reports that he has been smoking cigarettes and e-cigarettes. He has smoked for the past 36.00 years. He has quit using smokeless tobacco. He reports current alcohol use of about 1.0 standard drink of alcohol per week. He reports that he does not use drugs.   Review of Systems  Lipid history: Has been on statin drugs with Crestor 5 mg daily since 2019 Lipids are improved  Lab Results  Component Value Date   CHOL 118 12/01/2019   CHOL 195 04/20/2019   CHOL 109 09/14/2018   Lab Results  Component Value Date   HDL 37.80 (L) 12/01/2019   HDL 37 (L) 04/20/2019   HDL 34.70 (L) 09/14/2018   Lab Results  Component Value Date   LDLCALC 57 12/01/2019   LDLCALC 129 (H) 04/20/2019   LDLCALC 53 09/14/2018   Lab Results  Component Value Date   TRIG 116.0 12/01/2019   TRIG 171 (H) 04/20/2019   TRIG 108.0 09/14/2018   Lab Results  Component Value Date   CHOLHDL 3  12/01/2019   CHOLHDL 5.3 (H) 04/20/2019   CHOLHDL 3 09/14/2018   Lab Results  Component Value Date   LDLDIRECT 101.0 06/15/2018           He has history of fatty liver on his imaging studies  Lab Results  Component Value Date   ALT 19 12/01/2019    No history of hypertension:  BP Readings from Last 3 Encounters:  02/07/20 140/80  12/08/19 138/82  09/08/19 120/80    Most recent eye exam was in 2020 but report not available  Most recent foot exam: 08/2017    LABS:  Office Visit on 02/07/2020  Component Date Value Ref Range Status  . Glucose Fasting, POC 02/07/2020 194* 70 - 99 mg/dL Final  Lab on 02/02/2020  Component Date Value Ref Range Status  . Fructosamine 02/02/2020 263  0 - 285 umol/L Final   Comment: Published reference interval for apparently healthy subjects between age 54 and 37 is 44 - 285 umol/L and in a poorly controlled diabetic population is 228 - 563 umol/L with a mean of 396 umol/L.   Marland Kitchen Glucose, Bld 02/02/2020 107* 70 - 99 mg/dL Final    Physical Examination:  BP 140/80 (BP Location: Left Arm, Patient Position: Sitting, Cuff Size: Normal)   Pulse 94   Ht 5' 6.5" (1.689 m)   Wt 221 lb 12.8 oz (100.6  kg)   BMI 35.26 kg/m          ASSESSMENT:  Diabetes type 2, with obesity  See history of present illness for detailed discussion of current diabetes management, blood sugar patterns and problems identified  Last A1c was 7.9  Current management is now with Metformin, Prandin, and Ozempic 0.5 Blood sugars are improving especially with adding Ozempic and is losing weight  PLAN:     He was again given information on use of skin tac or other adhesives for use of the freestyle libre  Reduce high fat foods  Increase Prandin when eating larger amounts of carbohydrate  Stay on same dose of Ozempic  Regular exercise  A1c in 2 months   There are no Patient Instructions on file for this visit.       Elayne Snare 02/07/2020, 8:51 AM    Note: This office note was prepared with Dragon voice recognition system technology. Any transcriptional errors that result from this process are unintentional.  This visit occurred during the SARS-CoV-2 public health emergency.  Safety protocols were in place, including screening questions prior to the visit, additional usage of staff PPE, and extensive cleaning of exam room while observing appropriate contact time as indicated for disinfecting solutions.

## 2020-02-07 NOTE — Progress Notes (Signed)
E-Visit for [employer]   We are sorry that you are not feeling well.  Here is how we plan to help!  Based on what you have shared with me it looks like you have conjunctivitis.  Conjunctivitis is a common inflammatory or infectious condition of the eye that is often referred to as "pink eye".  In most cases it is contagious (viral or bacterial). However, not all conjunctivitis requires antibiotics (ex. Allergic).  We have made appropriate suggestions for you based upon your presentation.  I have prescribed Polytrim Ophthalmic drops 1-2 drops 4 times a day times 5 days  Pink eye can be highly contagious.  It is typically spread through direct contact with secretions, or contaminated objects or surfaces that one may have touched.  Strict handwashing is suggested with soap and water is urged.  If not available, use alcohol based had sanitizer.  Avoid unnecessary touching of the eye.  If you wear contact lenses, you will need to refrain from wearing them until you see no white discharge from the eye for at least 24 hours after being on medication.  You should see symptom improvement in 1-2 days after starting the medication regimen.  Call us if symptoms are not improved in 1-2 days.  Home Care:  Wash your hands often!  Do not wear your contacts until you complete your treatment plan.  Avoid sharing towels, bed linen, personal items with a person who has pink eye.  See attention for anyone in your home with similar symptoms.  Get Help Right Away If:  Your symptoms do not improve.  You develop blurred or loss of vision.  Your symptoms worsen (increased discharge, pain or redness)  Your e-visit answers were reviewed by a board certified advanced clinical practitioner to complete your personal care plan.  Depending on the condition, your plan could have included both over the counter or prescription medications.  If there is a problem please reply  once you have received a response from your  provider.  Your safety is important to [place].  If you have drug allergies check your prescription carefully.    You can use MyChart to ask questions about today's visit, request a non-urgent call back, or ask for a work or school excuse for 24 hours related to this e-Visit. If it has been greater than 24 hours you will need to follow up with your provider, or enter a new e-Visit to address those concerns.   You will get an e-mail in the next two days asking about your experience.  I hope that your e-visit has been valuable and will speed your recovery. Thank you for using e-visits.    5 min on chart

## 2020-02-07 NOTE — Patient Instructions (Signed)
Walk more  Check blood sugars on waking up 7  days a week  Also check blood sugars about 2 hours after meals and do this after different meals by rotation  Recommended blood sugar levels on waking up are 90-130 and about 2 hours after meal is 130-160  Please bring your blood sugar monitor to each visit, thank you

## 2020-02-12 ENCOUNTER — Other Ambulatory Visit: Payer: Self-pay | Admitting: Endocrinology

## 2020-02-16 ENCOUNTER — Other Ambulatory Visit (HOSPITAL_COMMUNITY)
Admission: RE | Admit: 2020-02-16 | Discharge: 2020-02-16 | Disposition: A | Discharge status: Home / Self Care | Payer: 59 | Source: Ambulatory Visit | Attending: Infectious Diseases | Admitting: Infectious Diseases

## 2020-02-16 ENCOUNTER — Other Ambulatory Visit: Payer: 59

## 2020-02-16 ENCOUNTER — Other Ambulatory Visit: Payer: Self-pay

## 2020-02-16 DIAGNOSIS — Z113 Encounter for screening for infections with a predominantly sexual mode of transmission: Secondary | ICD-10-CM

## 2020-02-16 DIAGNOSIS — B20 Human immunodeficiency virus [HIV] disease: Secondary | ICD-10-CM

## 2020-02-16 DIAGNOSIS — E782 Mixed hyperlipidemia: Secondary | ICD-10-CM

## 2020-02-16 DIAGNOSIS — Z79899 Other long term (current) drug therapy: Secondary | ICD-10-CM

## 2020-02-17 LAB — URINE CYTOLOGY ANCILLARY ONLY
Chlamydia: NEGATIVE
Comment: NEGATIVE
Comment: NORMAL
Neisseria Gonorrhea: NEGATIVE

## 2020-02-17 LAB — T-HELPER CELL (CD4) - (RCID CLINIC ONLY)
CD4 % Helper T Cell: 12 % — ABNORMAL LOW (ref 33–65)
CD4 T Cell Abs: 389 /uL — ABNORMAL LOW (ref 400–1790)

## 2020-02-20 ENCOUNTER — Other Ambulatory Visit: Payer: Self-pay | Admitting: Endocrinology

## 2020-02-20 LAB — RPR: RPR Ser Ql: NONREACTIVE

## 2020-02-20 LAB — COMPREHENSIVE METABOLIC PANEL
AG Ratio: 1.5 (calc) (ref 1.0–2.5)
ALT: 13 U/L (ref 9–46)
AST: 12 U/L (ref 10–35)
Albumin: 4.4 g/dL (ref 3.6–5.1)
Alkaline phosphatase (APISO): 63 U/L (ref 35–144)
BUN: 20 mg/dL (ref 7–25)
CO2: 25 mmol/L (ref 20–32)
Calcium: 9.2 mg/dL (ref 8.6–10.3)
Chloride: 106 mmol/L (ref 98–110)
Creat: 0.98 mg/dL (ref 0.70–1.33)
Globulin: 2.9 g/dL (calc) (ref 1.9–3.7)
Glucose, Bld: 146 mg/dL — ABNORMAL HIGH (ref 65–99)
Potassium: 4.4 mmol/L (ref 3.5–5.3)
Sodium: 141 mmol/L (ref 135–146)
Total Bilirubin: 0.3 mg/dL (ref 0.2–1.2)
Total Protein: 7.3 g/dL (ref 6.1–8.1)

## 2020-02-20 LAB — CBC
HCT: 40.6 % (ref 38.5–50.0)
Hemoglobin: 13.9 g/dL (ref 13.2–17.1)
MCH: 32.9 pg (ref 27.0–33.0)
MCHC: 34.2 g/dL (ref 32.0–36.0)
MCV: 96.2 fL (ref 80.0–100.0)
MPV: 11 fL (ref 7.5–12.5)
Platelets: 283 10*3/uL (ref 140–400)
RBC: 4.22 10*6/uL (ref 4.20–5.80)
RDW: 13.1 % (ref 11.0–15.0)
WBC: 12.6 10*3/uL — ABNORMAL HIGH (ref 3.8–10.8)

## 2020-02-20 LAB — LIPID PANEL
Cholesterol: 102 mg/dL (ref <200)
HDL: 37 mg/dL — ABNORMAL LOW (ref >=40)
LDL Cholesterol (Calc): 45 mg/dL (calc)
Non-HDL Cholesterol (Calc): 65 mg/dL (calc) (ref <130)
Total CHOL/HDL Ratio: 2.8 (calc) (ref <5.0)
Triglycerides: 121 mg/dL (ref <150)

## 2020-02-20 LAB — HIV-1 RNA QUANT-NO REFLEX-BLD
HIV 1 RNA Quant: <20 Copies/mL
HIV-1 RNA Quant, Log: <1.3 Log cps/mL

## 2020-02-21 ENCOUNTER — Other Ambulatory Visit: Payer: Self-pay | Admitting: Endocrinology

## 2020-02-29 ENCOUNTER — Other Ambulatory Visit: Payer: Self-pay | Admitting: Infectious Diseases

## 2020-02-29 DIAGNOSIS — B20 Human immunodeficiency virus [HIV] disease: Secondary | ICD-10-CM

## 2020-03-04 IMAGING — US ULTRASOUND ABDOMEN COMPLETE
1 series · 13 of 25 positions shown · non-contrast
Comparison: None.

CLINICAL DATA: Nausea and vomiting

EXAM:
ABDOMEN ULTRASOUND COMPLETE

[Series 1: ultrasound abdomen complete · 0.19mm/px · 13 of 79 slices shown]
[im 1/79]
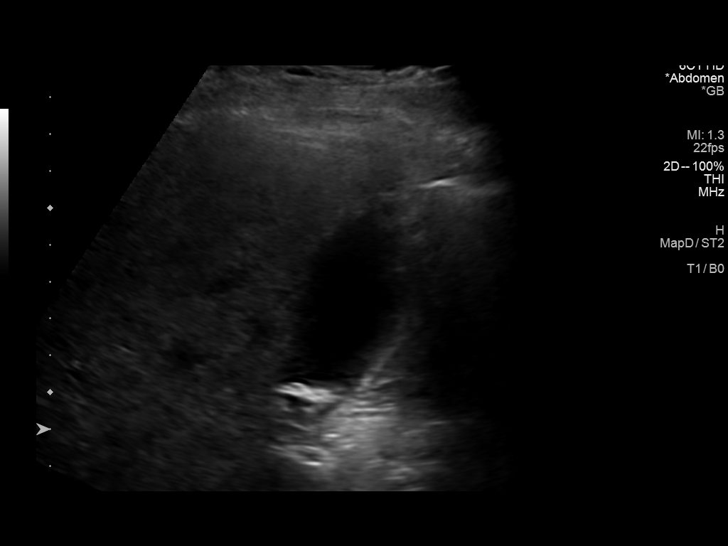
[im 7/79]
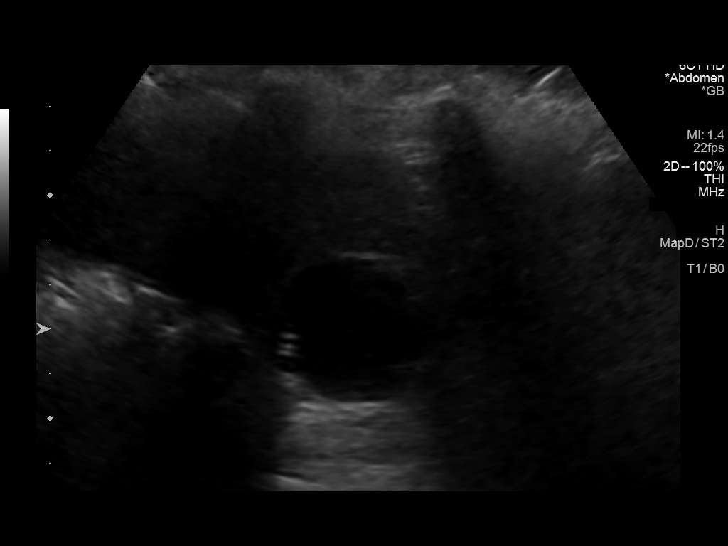
[im 14/79]
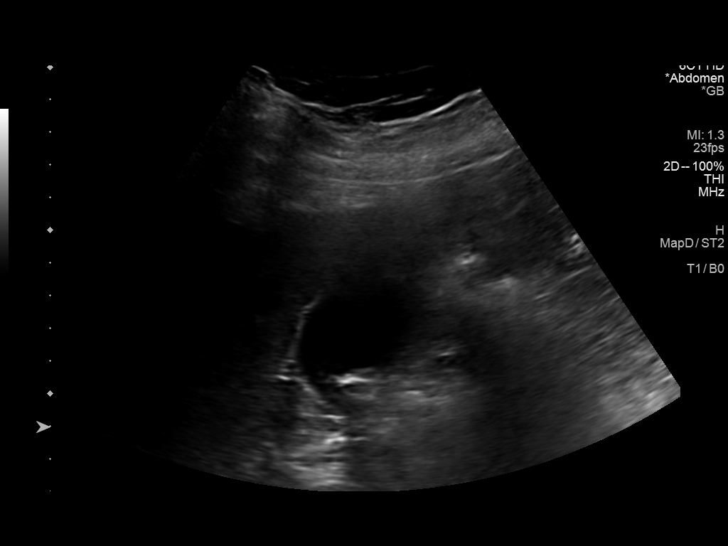
[im 20/79]
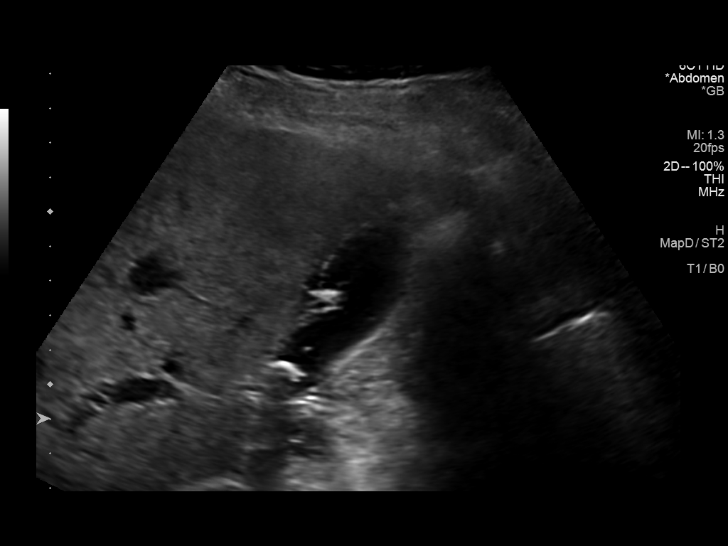
[im 27/79]
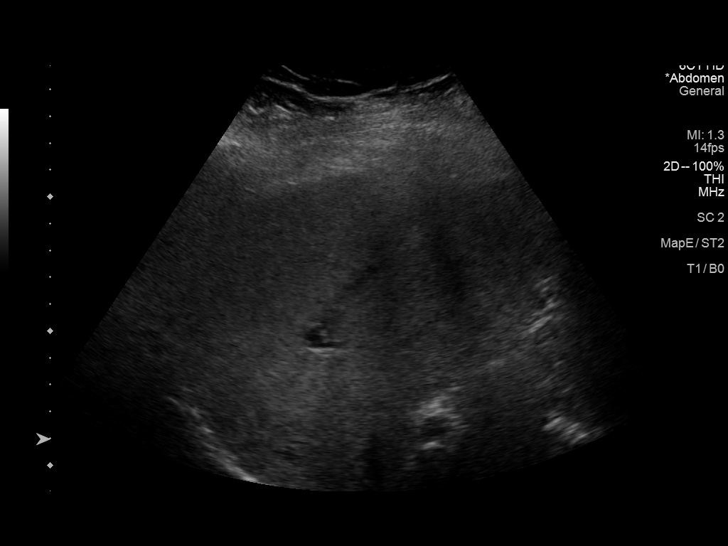
[im 33/79]
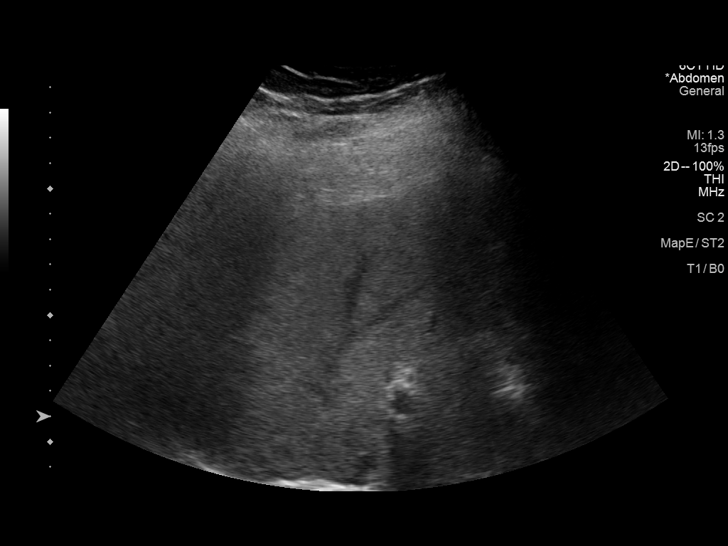
[im 40/79]
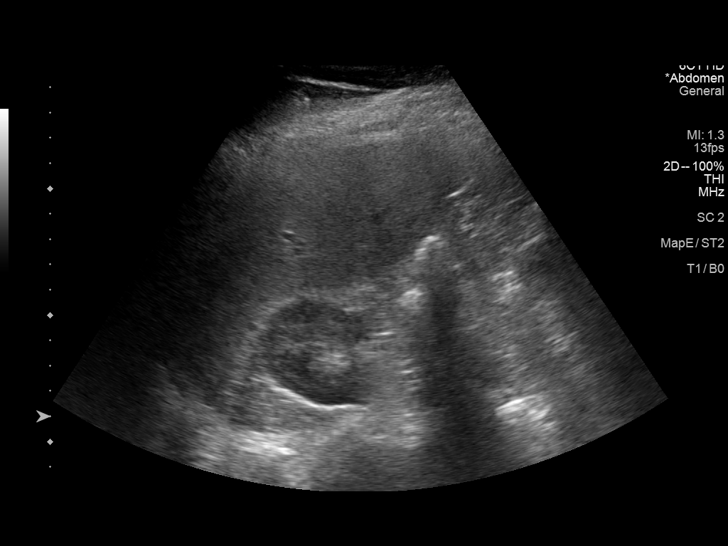
[im 46/79]
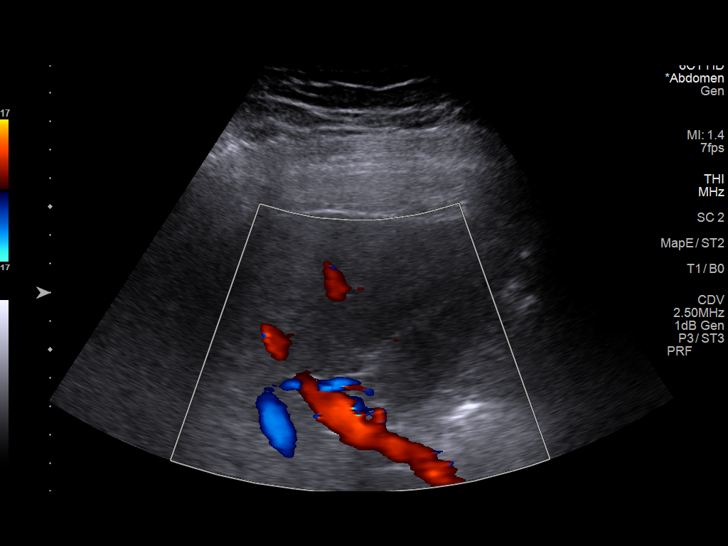
[im 53/79]
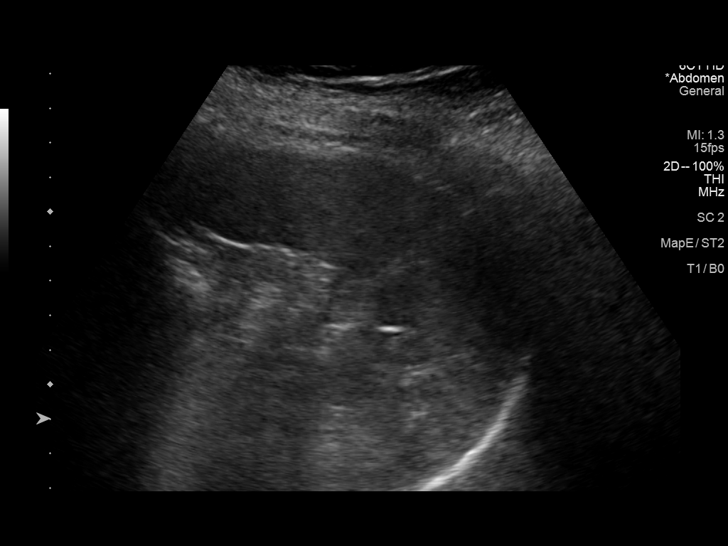
[im 59/79]
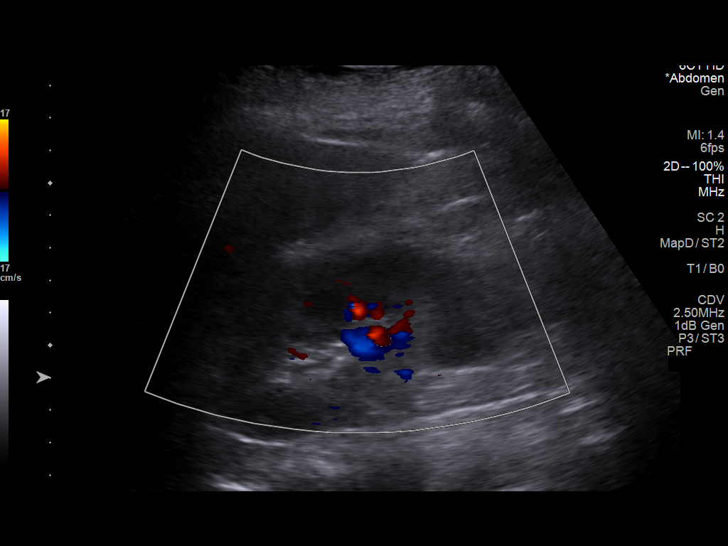
[im 66/79]
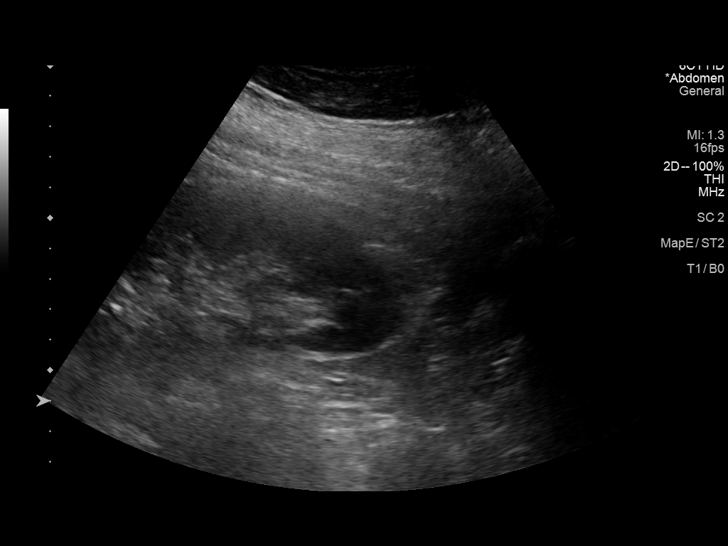
[im 72/79]
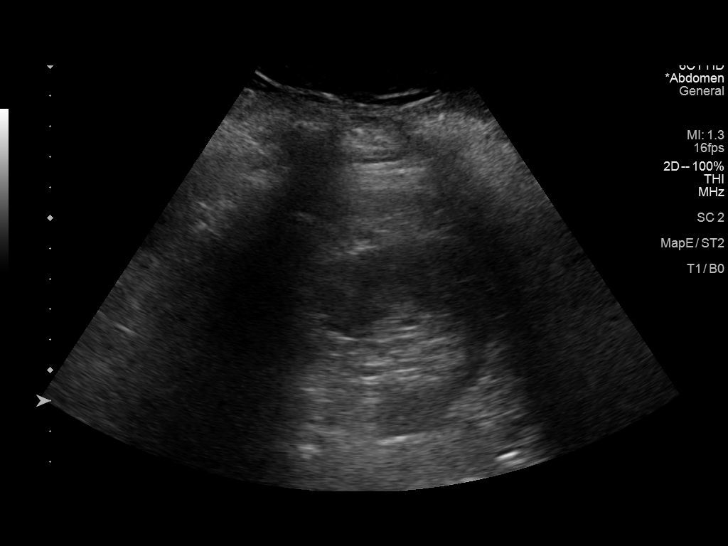
[im 79/79]
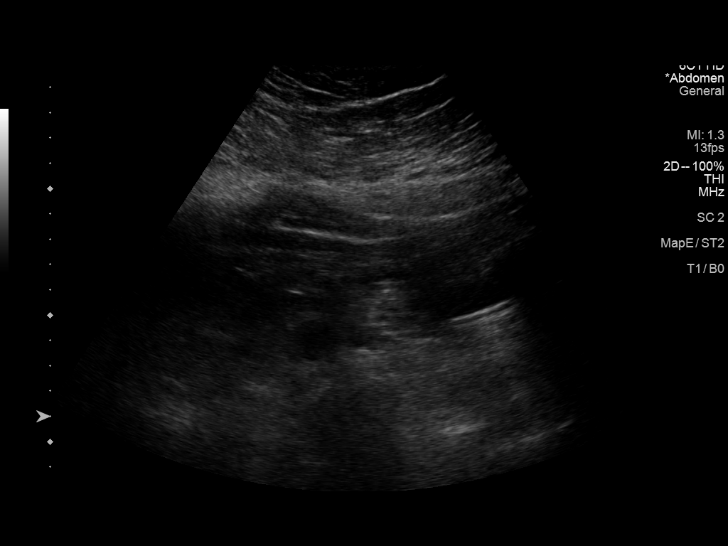

[13 of 25 positions shown; findings below may reference images not displayed]

FINDINGS: Gallbladder: Within the gallbladder, there are multiple echogenic
foci which move and shadow consistent with cholelithiasis. Largest
gallstone measures 1.5 cm in length. There is no appreciable
gallbladder wall thickening or pericholecystic fluid. No sonographic
Murphy sign noted by sonographer.

Common bile duct: Diameter: 4 mm. No intrahepatic, common hepatic,
or common bile duct dilatation.

Liver: No focal lesion identified. Liver echogenicity is increased
diffusely. Portal vein is patent on color Doppler imaging with
normal direction of blood flow towards the liver.

IVC: No abnormality visualized in regions that can be visualized.
Most of the infrahepatic inferior vena cava is obscured by gas.

Pancreas: Visualized portion unremarkable. Portions of pancreas
obscured by gas.

Spleen: Size and appearance within normal limits.

Right Kidney: Length: 11.7 cm. Echogenicity within normal limits. No
mass or hydronephrosis visualized.

Left Kidney: Length: 12.0 cm. Echogenicity within normal limits. No
mass or hydronephrosis visualized.

Abdominal aorta: No aneurysm visualized.

Other findings: No demonstrable ascites.
IMPRESSION: 1. Cholelithiasis. No gallbladder wall thickening or pericholecystic
fluid.

2. Diffuse increase in liver echogenicity, a finding indicative of
hepatic steatosis. While no focal liver lesions are evident on this
study, it must be cautioned that sensitivity of ultrasound for
detection of focal liver lesions is diminished in this circumstance.

3. Much of pancreas and inferior vena cava obscured by gas.
Visualized portions of the structures appear unremarkable.

4.  Study otherwise unremarkable.

## 2020-03-06 ENCOUNTER — Ambulatory Visit: Payer: 59

## 2020-03-06 ENCOUNTER — Other Ambulatory Visit: Payer: Self-pay

## 2020-03-06 ENCOUNTER — Encounter: Payer: Self-pay | Admitting: Infectious Diseases

## 2020-03-06 ENCOUNTER — Ambulatory Visit: Payer: 59 | Admitting: Infectious Diseases

## 2020-03-06 VITALS — BP 113/75 | HR 98 | Temp 97.9°F | Wt 222.0 lb

## 2020-03-06 DIAGNOSIS — Z113 Encounter for screening for infections with a predominantly sexual mode of transmission: Secondary | ICD-10-CM | POA: Diagnosis not present

## 2020-03-06 DIAGNOSIS — E1165 Type 2 diabetes mellitus with hyperglycemia: Secondary | ICD-10-CM

## 2020-03-06 DIAGNOSIS — Z23 Encounter for immunization: Secondary | ICD-10-CM | POA: Diagnosis not present

## 2020-03-06 DIAGNOSIS — Z79899 Other long term (current) drug therapy: Secondary | ICD-10-CM | POA: Diagnosis not present

## 2020-03-06 DIAGNOSIS — B20 Human immunodeficiency virus [HIV] disease: Secondary | ICD-10-CM | POA: Diagnosis not present

## 2020-03-06 DIAGNOSIS — E782 Mixed hyperlipidemia: Secondary | ICD-10-CM

## 2020-03-06 NOTE — Assessment & Plan Note (Signed)
He continues on resuvastatin.  His LFTs are normal.  Will recheck at f/u.

## 2020-03-06 NOTE — Assessment & Plan Note (Signed)
He needs to f/u with his PCP.  Some concern he is off his 2nd Dm med (wt loss due to GI distress?).  He also needs ophtho f/u (I am doubtful that he has 5 yr f/u).

## 2020-03-06 NOTE — Progress Notes (Signed)
   Subjective:    Patient ID: Brett Walsh, male    DOB: [birth year], 54 y.o.   MRN: [id number]  HPI 54yo M with hx of HIV+ since 2008 (when he was hospitalized with pneumonia), (prev on tivicay-descovy and was on atripla prior, developed resistance), also hx of DM2 (2012) on metformin and ozymbic (diarrhea and nausea- quit taking this weekend). I feel a lot better.   He was seen in f/u in ID clinic on 1-19-16and started on triumeq.  States his FSG have been good (91-180)- last A1C7.9%(12-2019).   Has been taking his ART without issue.   Managing diet around reflux, gallstones. Is deffering gallbladder surgery. No further issues recently.   Has gotten COVID vax x 2, wants booster.    HIV 1 RNA Quant  Date Value  02/16/2020 <20 Copies/mL  04/20/2019 23 copies/mL (H)  07/21/2018 <20 DETECTED copies/mL (A)   CD4 T Cell Abs (/uL)  Date Value  02/16/2020 389 (L)  04/20/2019 346 (L)  07/21/2018 290 (L)    Review of Systems  Constitutional: Negative for appetite change and unexpected weight change (down 10-15#).  Eyes: Negative for visual disturbance.  Respiratory: Negative for cough and shortness of breath.   Cardiovascular: Negative for leg swelling.  Gastrointestinal: Positive for diarrhea and nausea. Negative for constipation.  Genitourinary: Negative for difficulty urinating.  Neurological: Negative for numbness.  saw ophtho 2 yrs ago who told him to come back in 5 years. Please see HPI. All other systems reviewed and negative.      Objective:   Physical Exam Vitals reviewed.  Constitutional:      General: He is not in acute distress.    Appearance: Normal appearance. He is obese. He is not ill-appearing.  HENT:     Mouth/Throat:     Mouth: Mucous membranes are moist.     Pharynx: No oropharyngeal exudate.  Eyes:     Extraocular Movements: Extraocular movements intact.     Pupils: Pupils are equal, round, and reactive to light.  Cardiovascular:     Rate and  Rhythm: Normal rate and regular rhythm.     Pulses:          Dorsalis pedis pulses are 2+ on the right side and 2+ on the left side.  Pulmonary:     Effort: Pulmonary effort is normal.     Breath sounds: Normal breath sounds.  Abdominal:     General: Bowel sounds are normal. There is no distension.     Palpations: Abdomen is soft.     Tenderness: There is no abdominal tenderness.  Musculoskeletal:        General: No swelling. Normal range of motion.     Cervical back: Normal range of motion.     Right lower leg: No edema.     Left lower leg: No edema.     Right foot: Normal range of motion. No deformity, bunion or Charcot foot.  Feet:     Right foot:     Skin integrity: Skin integrity normal.  Neurological:     General: No focal deficit present.     Mental Status: He is alert.     Sensory: No sensory deficit.  Psychiatric:        Mood and Affect: Mood normal.           Assessment & Plan:

## 2020-03-06 NOTE — Assessment & Plan Note (Signed)
He is doing well Continue on triumeq Flu shot today COVID booster today Offered/refused condoms.  rtc in 9 months

## 2020-03-08 ENCOUNTER — Encounter: Payer: 59 | Admitting: Infectious Diseases

## 2020-03-12 ENCOUNTER — Encounter (HOSPITAL_COMMUNITY): Payer: Self-pay | Admitting: Emergency Medicine

## 2020-03-12 ENCOUNTER — Telehealth: Payer: 59 | Admitting: Emergency Medicine

## 2020-03-12 ENCOUNTER — Ambulatory Visit (HOSPITAL_COMMUNITY)
Admission: EM | Admit: 2020-03-12 | Discharge: 2020-03-12 | Disposition: A | Discharge status: Home / Self Care | Payer: 59 | Attending: Internal Medicine | Admitting: Internal Medicine

## 2020-03-12 ENCOUNTER — Other Ambulatory Visit: Payer: Self-pay

## 2020-03-12 DIAGNOSIS — H532 Diplopia: Secondary | ICD-10-CM

## 2020-03-12 DIAGNOSIS — H44009 Unspecified purulent endophthalmitis, unspecified eye: Secondary | ICD-10-CM

## 2020-03-12 DIAGNOSIS — H1013 Acute atopic conjunctivitis, bilateral: Secondary | ICD-10-CM

## 2020-03-12 MED ORDER — OLOPATADINE HCL 0.1 % OP SOLN: 1.0000 [drp] | Freq: Two times a day (BID) | OPHTHALMIC | 12 refills | Status: DC

## 2020-03-12 MED ORDER — OLOPATADINE HCL 0.1 % OP SOLN: 1.0000 [drp] | Freq: Two times a day (BID) | OPHTHALMIC | 1 refills | Status: DC

## 2020-03-12 NOTE — Progress Notes (Signed)
Based on what you shared with me, I feel your condition warrants further evaluation and I recommend that you be seen for a face to face office visit.   NOTE: If you entered your credit card information for this eVisit, you will not be charged. You may see a "hold" on your card for the $35 but that hold will drop off and you will not have a charge processed.   If you are having a true medical emergency please call 911.      For an urgent face to face visit, [place] has five urgent care centers for your convenience:      NEW:  [facility] at [facility] Directions [phone] [address] [place], [place] [address] . 10 am - 6pm Monday - Friday    [facility]) Get Driving Directions [phone] [address] [place], [place] [address] . 10 am to 8 pm Monday-Friday . 12 pm to 8 pm [facility] Urgent Care at [facility] Get Driving Directions [phone] [address], [address] [place], [place] [address] . 8 am to 8 pm Monday-Friday . 9 am to 6 pm Saturday . 11 am to 6 pm Sunday     [facility] at [facility] Get Driving Directions  [phone] [address] [place], [place] [address] . 8 am to 8 pm Monday-Friday . 8 am to 4 pm [facility] Urgent Care at [facility] Directions [phone] [address] [place], [place] [address] . 12 pm to 6 pm Monday-Friday      Your e-visit answers were reviewed by a board certified advanced clinical practitioner to complete your personal care plan.  Thank you for using e-Visits.    **Please do not respond to this message unless you have follow up questions.** Greater than 5 but less than 10 minutes spent researching, coordinating, and implementing care for this patient today

## 2020-03-12 NOTE — Discharge Instructions (Signed)
Please use medications as tolerated. If symptoms worsen please return to urgent care for re-evaluation.

## 2020-03-12 NOTE — ED Triage Notes (Signed)
Pt c/o conjunctivitis in the left eye last month. He states that the conjunctivitis spread to the the right eye last month so he began to use the antibiotic drops for both eyes. He states that he is still having itchy and watery, burning and pain in both eyes.

## 2020-03-14 NOTE — ED Provider Notes (Signed)
[place]    CSN: [id number] Arrival date & time: 03/12/20  1825      History   Chief Complaint Chief Complaint  Patient presents with  . Conjunctivitis    HPI Brett Walsh is a 54 y.o. male comes to the urgent care with complaints of redness and feeling of foreign body sensation in both eyes.  Patient was treated for a stye in the right eye about a month ago.  Symptoms resolved in the right eye and soon after that he started experiencing redness, foreign body sensation and increased tearing in both eyes.  Patient denies any history of seasonal allergies.  Patient has crusting in the morning.  No contact use.  No blurry vision.  HPI  Past Medical History:  Diagnosis Date  . Cataract   . Deviated septum   . Diabetes mellitus without complication ([place])   . Gallstones 10/2018  . HIV disease [facility])     Patient Active Problem List   Diagnosis Date Noted  . Mixed hyperlipidemia 06/11/2018  . Viral gastroenteritis 06/01/2018  . Morbid obesity ([place]) 02/02/2018  . Hyperlipidemia associated with type 2 diabetes mellitus ([place]) 02/02/2018  . Acute non intractable tension-type headache 11/12/2017  . Contusion of face 02/06/2017  . GERD (gastroesophageal reflux disease) 12/02/2016  . Medication reaction 10/14/2016  . Depression (emotion) 07/23/2016  . Insomnia 07/23/2016  . Routine general medical examination at a health care facility 06/20/2016  . TMJ (temporomandibular joint syndrome) 01/21/2016  . Slow transit constipation 08/16/2014  . HIV disease ([place]) 06/20/2014  . Diabetes type 2, uncontrolled ([place]) 06/20/2014    Past Surgical History:  Procedure Laterality Date  . [place], [id number]       Home Medications    Prior to Admission medications   Medication Sig Start Date End Date Taking? Authorizing Provider  Blood Glucose Monitoring Suppl (ONE TOUCH ULTRA MINI) w/Device KIT Use meter to check blood sugars 1-4 times daily as  instructed. 10/14/16  Yes Golden Circle, FNP  chlorhexidine (PERIDEX) 0.12 % solution Use as directed 15 mLs in the mouth or throat 2 (two) times daily.   Yes [provider]  Continuous Blood Gluc Sensor (FREESTYLE LIBRE 14 DAY SENSOR) MISC USE 1 SENSOR EVERY 14 DAYS 01/15/20  Yes Elayne Snare, MD  famotidine (PEPCID) 20 MG tablet TAKE 1 TABLET(20 MG) BY MOUTH TWICE DAILY 11/02/19  Yes Marrian Salvage, FNP  glucose blood (FREESTYLE PRECISION NEO TEST) test strip Use Freestyle Neo test strips to test blood sugars twice daily 12/08/19  Yes Elayne Snare, MD  glucose blood (ONE TOUCH ULTRA TEST) test strip Use one strip per test. Test blood sugars 1-4 times daily as instructed. 01/14/18  Yes Shambley, Delphia Grates, NP  Insulin Pen Needle (B-D ULTRAFINE III SHORT PEN) 31G X 8 MM MISC USE AS DIRECTED AS NEEDED 07/01/18  Yes Shambley, Ashleigh N, NP  INVOKANA 300 MG TABS tablet TAKE 1 TABLET(300 MG) BY MOUTH DAILY BEFORE BREAKFAST 02/21/20  Yes Elayne Snare, MD  Lancets Misc. (ONE TOUCH SURESOFT) MISC Use 1 lancet per test. Test blood sugars 1-4 times per day as instructed. 10/14/16  Yes Golden Circle, FNP  loratadine (CLARITIN) 10 MG tablet Take 1 tablet (10 mg total) by mouth daily as needed for allergies. 08/03/18  Yes Marrian Salvage, FNP  metFORMIN (GLUCOPHAGE-XR) 500 MG 24 hr tablet TAKE 2 TABLETS(1000 MG) BY MOUTH DAILY WITH LUNCH 12/29/19  Yes Elayne Snare, MD  [facility])  4 MG tablet Take 1 tablet (4 mg total) by mouth every 8 (eight) hours as needed for nausea or vomiting. 12/29/19  Yes Biagio Borg, MD  OZEMPIC, 0.25 OR 0.5 MG/DOSE, 2 MG/1.5ML SOPN ADMINISTER 0.5 MG UNDER THE SKIN 1 TIME A WEEK 02/20/20  Yes Elayne Snare, MD  repaglinide (PRANDIN) 1 MG tablet TAKE 1 TABLET(1 MG) BY MOUTH DAILY BEFORE SUPPER 02/13/20  Yes Elayne Snare, MD  rosuvastatin (CRESTOR) 5 MG tablet TAKE 1 TABLET(5 MG) BY MOUTH DAILY 01/30/20  Yes Elayne Snare, MD  triamcinolone (NASACORT) 55 MCG/ACT AERO  nasal inhaler Place 2 sprays into the nose daily. 08/03/18  Yes Marrian Salvage, FNP  trimethoprim-polymyxin b (POLYTRIM) ophthalmic solution Place 1 drop into the left eye every 4 (four) hours. 02/07/20  Yes Alveria Apley, PA-C  TRIUMEQ 600-50-300 MG tablet TAKE 1 TABLET BY MOUTH DAILY 02/29/20  Yes Campbell Riches, MD  olopatadine (PATANOL) 0.1 % ophthalmic solution Place 1 drop into both eyes 2 (two) times daily. 03/12/20   LampteyMyrene Galas, MD    Family History Family History  Problem Relation Age of Onset  . Diabetes Brother   . Diabetes Mother   . Diabetes Father   . Heart failure Father   . Diabetes Sister   . Colon cancer Neg Hx   . Esophageal cancer Neg Hx   . Rectal cancer Neg Hx   . Stomach cancer Neg Hx     Social History Social History   Tobacco Use  . Smoking status: Light Tobacco Smoker    Years: 36.00    Types: Cigarettes, E-cigarettes  . Smokeless tobacco: Former Systems developer  . Tobacco comment: 3-4/day  Vaping Use  . Vaping Use: Never used  Substance Use Topics  . Alcohol use: Yes    Alcohol/week: 1.0 standard drink    Types: 1 Standard drinks or equivalent per week    Comment: socially  . Drug use: No    Types: Hydromorphone     Allergies   Metformin and related and Protonix [pantoprazole sodium]   Review of Systems Review of Systems  Constitutional: Negative.   HENT: Negative.   Eyes: Positive for pain, discharge, redness and itching. Negative for photophobia and visual disturbance.  Respiratory: Negative.   Cardiovascular: Negative.   Gastrointestinal: Negative.      Physical Exam Triage Vital Signs ED Triage Vitals  Enc Vitals Group     BP 03/12/20 2014 137/89     Pulse Rate 03/12/20 2014 86     Resp --      Temp 03/12/20 2014 97.9 F (36.6 C)     Temp Source 03/12/20 2014 Oral     SpO2 03/12/20 2014 100 %     Weight --      Height --      Head Circumference --      Peak Flow --      Pain Score 03/12/20 2012 7     Pain Loc  --      Pain Edu? --      Excl. in [place]? --    No data found.  Updated Vital Signs BP 137/89 (BP Location: Right Arm)   Pulse 86   Temp 97.9 F (36.6 C) (Oral)   SpO2 100%   Visual Acuity Right Eye Distance:   Left Eye Distance:   Bilateral Distance:    Right Eye Near:   Left Eye Near:    Bilateral Near:     Physical Exam  Vitals and nursing note reviewed.  Constitutional:      General: He is not in acute distress.    Appearance: He is not ill-appearing or diaphoretic.  HENT:     Right Ear: Tympanic membrane normal.     Left Ear: Tympanic membrane normal.     Nose: Nose normal. No congestion or rhinorrhea.     Mouth/Throat:     Mouth: Mucous membranes are moist.     Pharynx: No posterior oropharyngeal erythema.  Eyes:     Extraocular Movements: Extraocular movements intact.     Pupils: Pupils are equal, round, and reactive to light.     Comments: Conjunctival erythema bilaterally.  Cardiovascular:     Rate and Rhythm: Normal rate and regular rhythm.  Neurological:     Mental Status: He is alert.      UC Treatments / Results  Labs (all labs ordered are listed, but only abnormal results are displayed) Labs Reviewed - No data to display  EKG   Radiology No results found.  Procedures Procedures (including critical care time)  Medications Ordered in UC Medications - No data to display  Initial Impression / Assessment and Plan / UC Course  I have reviewed the triage vital signs and the nursing notes.  Pertinent labs & imaging results that were available during my care of the patient were reviewed by me and considered in my medical decision making (see chart for details).     1.  Bilateral conjunctivitis likely allergic: Patanol eyedrops If patient symptoms worsen he is advised to return to the urgent care to be reevaluated If he experiences blurry vision, photophobia he is advised to return to urgent care immediately. Final Clinical Impressions(s) / UC  Diagnoses   Final diagnoses:  Allergic conjunctivitis of both eyes     Discharge Instructions     Please use medications as tolerated. If symptoms worsen please return to urgent care for re-evaluation.   ED Prescriptions    Medication Sig Dispense Auth. Provider   olopatadine (PATANOL) 0.1 % ophthalmic solution  (Status: Discontinued) Place 1 drop into both eyes 2 (two) times daily. 5 each Lamptey, Myrene Galas, MD   olopatadine (PATANOL) 0.1 % ophthalmic solution Place 1 drop into both eyes 2 (two) times daily. 5 mL Lamptey, Myrene Galas, MD     PDMP not reviewed this encounter.   Chase Picket, MD 03/14/20 [phone]

## 2020-03-16 ENCOUNTER — Ambulatory Visit (INDEPENDENT_AMBULATORY_CARE_PROVIDER_SITE_OTHER): Payer: 59

## 2020-03-16 ENCOUNTER — Other Ambulatory Visit: Payer: Self-pay

## 2020-03-16 DIAGNOSIS — Z23 Encounter for immunization: Secondary | ICD-10-CM

## 2020-03-20 ENCOUNTER — Other Ambulatory Visit: Payer: Self-pay | Admitting: Endocrinology

## 2020-03-22 ENCOUNTER — Other Ambulatory Visit: Payer: Self-pay | Admitting: Endocrinology

## 2020-03-28 ENCOUNTER — Other Ambulatory Visit: Payer: Self-pay | Admitting: Infectious Diseases

## 2020-03-28 DIAGNOSIS — B20 Human immunodeficiency virus [HIV] disease: Secondary | ICD-10-CM

## 2020-04-12 ENCOUNTER — Other Ambulatory Visit: Payer: Self-pay

## 2020-04-12 ENCOUNTER — Other Ambulatory Visit (INDEPENDENT_AMBULATORY_CARE_PROVIDER_SITE_OTHER): Payer: 59

## 2020-04-12 DIAGNOSIS — Z794 Long term (current) use of insulin: Secondary | ICD-10-CM | POA: Diagnosis not present

## 2020-04-12 DIAGNOSIS — E1165 Type 2 diabetes mellitus with hyperglycemia: Secondary | ICD-10-CM

## 2020-04-12 LAB — MICROALBUMIN / CREATININE URINE RATIO
Creatinine,U: 28.3 mg/dL
Microalb Creat Ratio: 2.5 mg/g (ref 0.0–30.0)
Microalb, Ur: <0.7 mg/dL (ref 0.0–1.9)

## 2020-04-12 LAB — BASIC METABOLIC PANEL
BUN: 18 mg/dL (ref 6–23)
CO2: 26 mEq/L (ref 19–32)
Calcium: 9 mg/dL (ref 8.4–10.5)
Chloride: 102 mEq/L (ref 96–112)
Creatinine, Ser: 1 mg/dL (ref 0.40–1.50)
GFR: 86.66 mL/min (ref >60.00)
Glucose, Bld: 132 mg/dL — ABNORMAL HIGH (ref 70–99)
Potassium: 4.1 mEq/L (ref 3.5–5.1)
Sodium: 135 mEq/L (ref 135–145)

## 2020-04-12 LAB — HEMOGLOBIN A1C: Hgb A1c MFr Bld: 7 % — ABNORMAL HIGH (ref 4.6–6.5)

## 2020-04-17 ENCOUNTER — Other Ambulatory Visit: Payer: Self-pay | Admitting: Endocrinology

## 2020-04-17 DIAGNOSIS — E1165 Type 2 diabetes mellitus with hyperglycemia: Secondary | ICD-10-CM

## 2020-04-18 ENCOUNTER — Other Ambulatory Visit: Payer: Self-pay | Admitting: Endocrinology

## 2020-04-19 ENCOUNTER — Other Ambulatory Visit: Payer: Self-pay

## 2020-04-19 ENCOUNTER — Ambulatory Visit (INDEPENDENT_AMBULATORY_CARE_PROVIDER_SITE_OTHER): Payer: 59 | Admitting: Endocrinology

## 2020-04-19 ENCOUNTER — Encounter: Payer: Self-pay | Admitting: Endocrinology

## 2020-04-19 VITALS — BP 128/84 | HR 91 | Ht 67.0 in | Wt 224.0 lb

## 2020-04-19 DIAGNOSIS — E1165 Type 2 diabetes mellitus with hyperglycemia: Secondary | ICD-10-CM

## 2020-04-19 LAB — GLUCOSE, POCT (MANUAL RESULT ENTRY): POC Glucose: 116 mg/dl — AB (ref 70–99)

## 2020-04-19 NOTE — Progress Notes (Signed)
Patient ID: Brett Walsh, male   DOB: 09/05/1965, 54 y.o.   MRN: [id number]           Reason for Appointment: Follow-up for Type 2 Diabetes   History of Present Illness:          Date of diagnosis of type 2 diabetes mellitus: 2012       Background history:   At the time of diagnosis he weighed about 268 pounds His blood sugars were markedly increased at that time when he was hospitalized for treatment and sent home on insulin, metformin and Avandia He says that however he was able to lose weight with improved diet and was able to get off medications and insulin  He has been on various treatment since a couple of years later but detailed records are not available, moved from [place] His A1c has been as high as 14.5 in 2017 when he was prescribed Basaglar and Humalog although he does not remember this Subsequently his blood sugar control has been quite variable He was first started on metformin in 06/2016 and the dose was progressively increased  Recent history:   Non-insulin hypoglycemic drugs: Invokana 300 mg daily, Prandin 1 mg before breakfast, Metformin 500 mg twice daily Ozempic 0.5 mg weekly  A1c is improved at 7% from 7.9, has been as low as 5.8 Fructosamine is 263 last  Current history of diabetes, recent management, blood sugar patterns and problems identified:  He did not bring his monitor for download  Also is having some issues with his freestyle libre not working consistently or losing the sensor  Previously had been given information on how to improve the adherence  Also his A1c is better his weight has gone up 2 pounds despite continuing Ozempic  He said that the nausea is finally wearing off  He was told to take Prandin before breakfast and dinner but appears to be doing it only in the morning  However he does not think his sugars are more than 160 after meals except when he is eating some sweets which is rare  Fasting blood sugars appear to be  relatively better, previously averaging 147; he thinks his sugars are mostly under 120 in the morning        Side effects from medications have been: Diarrhea from high-dose metformin, constipation from Metformin ER  Glucose data not available  Previous readings:  CGM use % of time  20  2-week average/SD  128  Time in range     95   %  % Time Above 180   % Time above 250   % Time Below 70      PRE-MEAL Fasting Lunch Dinner Bedtime Overall  Glucose range:       Averages:  147  133  134  123  128      Self-care: The diet that the patient has been following is: tries to limit sugar, may occasionally have fried food.      Usually eating breakfast at 5 AM and dinner at 7-8 PM                Dietician visit, most recent: Never  Weight history:  Wt Readings from Last 3 Encounters:  04/19/20 224 lb (101.6 kg)  03/06/20 222 lb (100.7 kg)  02/07/20 221 lb 12.8 oz (100.6 kg)    Glycemic control:   Lab Results  Component Value Date   HGBA1C 7.0 (H) 04/12/2020   HGBA1C 7.9 (H) 12/01/2019  HGBA1C 7.1 (H) 08/24/2019   Lab Results  Component Value Date   MICROALBUR <0.7 04/12/2020   LDLCALC 45 02/16/2020   CREATININE 1.00 04/12/2020   Lab Results  Component Value Date   MICRALBCREAT 2.5 04/12/2020    Lab Results  Component Value Date   FRUCTOSAMINE 263 02/02/2020   FRUCTOSAMINE 436 (H) 09/28/2017      Allergies as of 04/19/2020      Reactions   Metformin And Related    GI upset   Protonix [pantoprazole Sodium] Hives      Medication List       Accurate as of April 19, 2020  8:55 AM. If you have any questions, ask your nurse or doctor.        chlorhexidine 0.12 % solution Commonly known as: PERIDEX Use as directed 15 mLs in the mouth or throat 2 (two) times daily.   famotidine 20 MG tablet Commonly known as: PEPCID TAKE 1 TABLET(20 MG) BY MOUTH TWICE DAILY   FreeStyle Libre 14 Day Sensor Misc USE 1 SENSOR EVERY 14 DAYS   glucose blood test  strip Commonly known as: ONE TOUCH ULTRA TEST Use one strip per test. Test blood sugars 1-4 times daily as instructed.   FreeStyle Precision Neo Test test strip Generic drug: glucose blood Use Freestyle Neo test strips to test blood sugars twice daily   Insulin Pen Needle 31G X 8 MM Misc Commonly known as: B-D ULTRAFINE III SHORT PEN USE AS DIRECTED AS NEEDED   Invokana 300 MG Tabs tablet Generic drug: canagliflozin TAKE 1 TABLET(300 MG) BY MOUTH DAILY BEFORE BREAKFAST   loratadine 10 MG tablet Commonly known as: CLARITIN Take 1 tablet (10 mg total) by mouth daily as needed for allergies.   metFORMIN 500 MG 24 hr tablet Commonly known as: GLUCOPHAGE-XR TAKE 2 TABLETS(1000 MG) BY MOUTH DAILY WITH LUNCH   olopatadine 0.1 % ophthalmic solution Commonly known as: Patanol Place 1 drop into both eyes 2 (two) times daily.   ondansetron 4 MG tablet Commonly known as: Zofran Take 1 tablet (4 mg total) by mouth every 8 (eight) hours as needed for nausea or vomiting.   ONE TOUCH SURESOFT Misc Use 1 lancet per test. Test blood sugars 1-4 times per day as instructed.   ONE TOUCH ULTRA MINI w/Device Kit Use meter to check blood sugars 1-4 times daily as instructed.   Ozempic (0.25 or 0.5 MG/DOSE) 2 MG/1.5ML Sopn Generic drug: Semaglutide(0.25 or 0.5MG/DOS) ADMINISTER 0.5 MG UNDER THE SKIN 1 TIME A WEEK   repaglinide 1 MG tablet Commonly known as: PRANDIN TAKE 1 TABLET(1 MG) BY MOUTH DAILY BEFORE SUPPER   rosuvastatin 5 MG tablet Commonly known as: CRESTOR TAKE 1 TABLET(5 MG) BY MOUTH DAILY   triamcinolone 55 MCG/ACT Aero nasal inhaler Commonly known as: NASACORT Place 2 sprays into the nose daily.   trimethoprim-polymyxin b ophthalmic solution Commonly known as: Polytrim Place 1 drop into the left eye every 4 (four) hours.   Triumeq 600-50-300 MG tablet Generic drug: abacavir-dolutegravir-lamiVUDine TAKE 1 TABLET BY MOUTH DAILY       Allergies:  Allergies   Allergen Reactions   Metformin And Related     GI upset   Protonix [Pantoprazole Sodium] Hives    Past Medical History:  Diagnosis Date   Cataract    Deviated septum    Diabetes mellitus without complication ([place])    Gallstones 10/2018   HIV disease ([place])     Past Surgical History:  Procedure Laterality Date  [place], [phone]    Family History  Problem Relation Age of Onset   Diabetes Brother    Diabetes Mother    Diabetes Father    Heart failure Father    Diabetes Sister    Colon cancer Neg Hx    Esophageal cancer Neg Hx    Rectal cancer Neg Hx    Stomach cancer Neg Hx     Social History:  reports that he has been smoking cigarettes and e-cigarettes. He has smoked for the past 36.00 years. He has quit using smokeless tobacco. He reports current alcohol use of about 1.0 standard drink of alcohol per week. He reports that he does not use drugs.   Review of Systems  Lipid history: Has been on statin drugs with Crestor 5 mg daily since 2019 Lipids are well controlled  Lab Results  Component Value Date   CHOL 102 02/16/2020   CHOL 118 12/01/2019   CHOL 195 04/20/2019   Lab Results  Component Value Date   HDL 37 (L) 02/16/2020   HDL 37.80 (L) 12/01/2019   HDL 37 (L) 04/20/2019   Lab Results  Component Value Date   LDLCALC 45 02/16/2020   LDLCALC 57 12/01/2019   LDLCALC 129 (H) 04/20/2019   Lab Results  Component Value Date   TRIG 121 02/16/2020   TRIG 116.0 12/01/2019   TRIG 171 (H) 04/20/2019   Lab Results  Component Value Date   CHOLHDL 2.8 02/16/2020   CHOLHDL 3 12/01/2019   CHOLHDL 5.3 (H) 04/20/2019   Lab Results  Component Value Date   LDLDIRECT 101.0 06/15/2018           He has history of fatty liver on his imaging studies  Lab Results  Component Value Date   ALT 13 02/16/2020    No history of hypertension:  BP Readings from Last 3 Encounters:  04/19/20 128/84  03/12/20 137/89  03/06/20  113/75    Most recent eye exam was in 2020 but report not available  Most recent foot exam: 11/21    LABS:  Office Visit on 04/19/2020  Component Date Value Ref Range Status   POC Glucose 04/19/2020 116* 70 - 99 mg/dl Final    Physical Examination:  BP 128/84    Pulse 91    Ht 5' 7"  (1.702 m)    Wt 224 lb (101.6 kg)    SpO2 97%    BMI 35.08 kg/m       Diabetic Foot Exam - Simple   Simple Foot Form Diabetic Foot exam was performed with the following findings: Yes 04/19/2020  9:05 AM  Visual Inspection No deformities, no ulcerations, no other skin breakdown bilaterally: Yes Sensation Testing Intact to touch and monofilament testing bilaterally: Yes Pulse Check Posterior Tibialis and Dorsalis pulse intact bilaterally: Yes Comments        ASSESSMENT:  Diabetes type 2, with obesity  See history of present illness for detailed discussion of current diabetes management, blood sugar patterns and problems identified  Last A1c was 7.9 and is now down to 7%, has been better than this in the past  Current management is now with Metformin, Prandin, and Ozempic 0.5 He has improved his level of control likely to be from continued Ozempic which he is tolerating as well as consistently cutting back on portions Unable to check his blood sugars and he thinks his only high blood sugars are likely after eating sweets Fasting readings appear to be better than  before although 132 in the lab However is not exercising enough  Discussed long-term benefits of weight loss especially to avoid insulin treatment again He has morbid obesity with BMI over 35 and associated hyperlipidemia  Microalbumin level and foot exam normal today  PLAN:     Continue to make efforts to lose weight with diet and exercise  He can take Prandin in the evening when eating larger amounts of carbohydrate or any sweet  Stay on same dose 0.5 mg of Ozempic weekly  To try and check his sugars with the  freestyle libre if possible and especially after meals, and to call if they are persistently higher  He will exercise at the gym since he can do this now  Follow-up in 4 months   There are no Patient Instructions on file for this visit.       Elayne Snare 04/19/2020, 8:55 AM   Note: This office note was prepared with Dragon voice recognition system technology. Any transcriptional errors that result from this process are unintentional.  This visit occurred during the SARS-CoV-2 public health emergency.  Safety protocols were in place, including screening questions prior to the visit, additional usage of staff PPE, and extensive cleaning of exam room while observing appropriate contact time as indicated for disinfecting solutions.

## 2020-05-04 ENCOUNTER — Ambulatory Visit: Payer: 59 | Admitting: Family

## 2020-05-08 ENCOUNTER — Other Ambulatory Visit: Payer: Self-pay

## 2020-05-08 ENCOUNTER — Encounter: Payer: Self-pay | Admitting: Family

## 2020-05-08 ENCOUNTER — Ambulatory Visit (INDEPENDENT_AMBULATORY_CARE_PROVIDER_SITE_OTHER): Payer: 59 | Admitting: Family

## 2020-05-08 VITALS — BP 118/82 | HR 84 | Temp 98.0°F | Ht 67.0 in | Wt 221.0 lb

## 2020-05-08 DIAGNOSIS — Z0001 Encounter for general adult medical examination with abnormal findings: Secondary | ICD-10-CM

## 2020-05-08 DIAGNOSIS — Z125 Encounter for screening for malignant neoplasm of prostate: Secondary | ICD-10-CM

## 2020-05-08 DIAGNOSIS — B351 Tinea unguium: Secondary | ICD-10-CM | POA: Diagnosis not present

## 2020-05-08 DIAGNOSIS — Z1211 Encounter for screening for malignant neoplasm of colon: Secondary | ICD-10-CM

## 2020-05-08 DIAGNOSIS — Z0000 Encounter for general adult medical examination without abnormal findings: Secondary | ICD-10-CM

## 2020-05-08 LAB — PSA: PSA: 0.86 ng/mL (ref 0.10–4.00)

## 2020-05-08 MED ORDER — TRIAMCINOLONE ACETONIDE 55 MCG/ACT NA AERO: 2.0000 | INHALATION_SPRAY | Freq: Every day | NASAL | 12 refills | Status: DC

## 2020-05-08 NOTE — Progress Notes (Signed)
Brett Walsh is a 54 y.o. male with the following history as recorded in Epic[facility]:  Patient Active Problem List   Diagnosis Date Noted  . Mixed hyperlipidemia 06/11/2018  . Viral gastroenteritis 06/01/2018  . Morbid obesity ([place]) 02/02/2018  . Hyperlipidemia associated with type 2 diabetes mellitus ([place]) 02/02/2018  . Acute non intractable tension-type headache 11/12/2017  . Contusion of face 02/06/2017  . GERD (gastroesophageal reflux disease) 12/02/2016  . Medication reaction 10/14/2016  . Depression (emotion) 07/23/2016  . Insomnia 07/23/2016  . Routine general medical examination at a health care facility 06/20/2016  . TMJ (temporomandibular joint syndrome) 01/21/2016  . Slow transit constipation 08/16/2014  . HIV disease ([place]) 06/20/2014  . Diabetes type 2, uncontrolled ([place]) 06/20/2014    Current Outpatient Medications  Medication Sig Dispense Refill  . Blood Glucose Monitoring Suppl (ONE TOUCH ULTRA MINI) w/Device KIT Use meter to check blood sugars 1-4 times daily as instructed. 1 each 0  . Continuous Blood Gluc Sensor (FREESTYLE LIBRE 14 DAY SENSOR) MISC USE 1 SENSOR EVERY 14 DAYS 2 each 1  . glucose blood (FREESTYLE PRECISION NEO TEST) test strip Use Freestyle Neo test strips to test blood sugars twice daily 100 each 3  . glucose blood (ONE TOUCH ULTRA TEST) test strip Use one strip per test. Test blood sugars 1-4 times daily as instructed. 100 each 12  . Insulin Pen Needle (B-D ULTRAFINE III SHORT PEN) 31G X 8 MM MISC USE AS DIRECTED AS NEEDED 100 each 5  . INVOKANA 300 MG TABS tablet TAKE 1 TABLET(300 MG) BY MOUTH DAILY BEFORE BREAKFAST 30 tablet 3  . Lancets Misc. (ONE TOUCH SURESOFT) MISC Use 1 lancet per test. Test blood sugars 1-4 times per day as instructed. 1 each 1  . loratadine (CLARITIN) 10 MG tablet Take 1 tablet (10 mg total) by mouth daily as needed for allergies. 30 tablet 11  . metFORMIN (GLUCOPHAGE-XR) 500 MG 24 hr tablet TAKE 2 TABLETS(1000 MG) BY MOUTH  DAILY WITH LUNCH 60 tablet 3  . OZEMPIC, 0.25 OR 0.5 MG/DOSE, 2 MG/1.5ML SOPN ADMINISTER 0.5 MG UNDER THE SKIN 1 TIME A WEEK 1.5 mL 3  . repaglinide (PRANDIN) 1 MG tablet TAKE 1 TABLET(1 MG) BY MOUTH DAILY BEFORE SUPPER 30 tablet 1  . rosuvastatin (CRESTOR) 5 MG tablet TAKE 1 TABLET(5 MG) BY MOUTH DAILY 90 tablet 3  . TRIUMEQ 600-50-300 MG tablet TAKE 1 TABLET BY MOUTH DAILY 30 tablet 4  . triamcinolone (NASACORT) 55 MCG/ACT AERO nasal inhaler Place 2 sprays into the nose daily. 1 each 12   No current facility-administered medications for this visit.    Allergies: Metformin and related and Protonix [pantoprazole sodium]  Past Medical History:  Diagnosis Date  . Cataract   . Deviated septum   . Diabetes mellitus without complication ([place])   . Gallstones 10/2018  . HIV disease [facility])     Past Surgical History:  Procedure Laterality Date  . NASAL SEPTUM SURGERY  1981, [id number]    Family History  Problem Relation Age of Onset  . Diabetes Brother   . Diabetes Mother   . Diabetes Father   . Heart failure Father   . Diabetes Sister   . Colon cancer Neg Hx   . Esophageal cancer Neg Hx   . Rectal cancer Neg Hx   . Stomach cancer Neg Hx     Social History   Tobacco Use  . Smoking status: Light Tobacco Smoker    Years: 36.00  Types: Cigarettes, E-cigarettes  . Smokeless tobacco: Former Systems developer  . Tobacco comment: 3-4/day  Substance Use Topics  . Alcohol use: Yes    Alcohol/week: 1.0 standard drink    Types: 1 Standard drinks or equivalent per week    Comment: socially    Subjective:  Presents for yearly CPE; majority of healthcare concerns are managed by his endocrinologist and ID providers; is overdue for colonoscopy- does not feel he has time to do the procedure/ would be open to other options;  Has been having some "ringing in his ears" x 6 months; overdue to see his dentist and eye doctor;   Review of Systems  Constitutional: Negative.   HENT: Positive for tinnitus.    Eyes: Negative.   Respiratory: Negative.   Cardiovascular: Negative.   Gastrointestinal: Negative.   Genitourinary: Negative.   Musculoskeletal: Negative.   Skin: Negative.   Neurological: Negative.   Endo/Heme/Allergies: Negative.   Psychiatric/Behavioral: Negative for depression.     Objective:  Vitals:   05/08/20 0841  BP: 118/82  Pulse: 84  Temp: 98 F (36.7 C)  TempSrc: Oral  SpO2: 99%  Weight: 221 lb (100.2 kg)  Height: 5' 7"  (1.702 m)    General: Well developed, well nourished, in no acute distress  Skin : Warm and dry.  Head: Normocephalic and atraumatic  Eyes: Sclera and conjunctiva clear; pupils round and reactive to light; extraocular movements intact  Ears: External normal; canals clear; tympanic membranes normal  Oropharynx: Pink, supple. No suspicious lesions  Neck: Supple without thyromegaly, adenopathy  Lungs: Respirations unlabored; clear to auscultation bilaterally without wheeze, rales, rhonchi  CVS exam: normal rate and regular rhythm.  Abdomen: Soft; nontender; nondistended; normoactive bowel sounds; no masses or hepatosplenomegaly  Musculoskeletal: No deformities; no active joint inflammation  Extremities: No edema, cyanosis, clubbing  Vessels: Symmetric bilaterally  Neurologic: Alert and oriented; speech intact; face symmetrical; moves all extremities well; CNII-XII intact without focal deficit   Assessment:  1. PE (physical exam), annual   2. Toenail fungus   3. Prostate cancer screening   4. Colon cancer screening     Plan:  Age appropriate preventive healthcare needs addressed; encouraged regular eye doctor and dental exams; encouraged regular exercise; will update labs and refills as needed today; follow-up to be determined; Continue with endocrine and ID as scheduled; agrees to Cologuard as alternative to colonoscopy; check PSA; refer to podiatrist to discuss nail fungus- has already taken Lamisil and am concerned to re-try with concurrent  HIV medications;   This visit occurred during the SARS-CoV-2 public health emergency.  Safety protocols were in place, including screening questions prior to the visit, additional usage of staff PPE, and extensive cleaning of exam room while observing appropriate contact time as indicated for disinfecting solutions.      No follow-ups on file.  Orders Placed This Encounter  Procedures  . PSA    Standing Status:   Future    Number of Occurrences:   1    Standing Expiration Date:   05/08/2021  . Cologuard  . Ambulatory referral to Podiatry    Referral Priority:   Routine    Referral Type:   Consultation    Referral Reason:   Specialty Services Required    Requested Specialty:   Podiatry    Number of Visits Requested:   1    Requested Prescriptions   Signed Prescriptions Disp Refills  . triamcinolone (NASACORT) 55 MCG/ACT AERO nasal inhaler 1 each 12    Sig:  Place 2 sprays into the nose daily.

## 2020-06-07 ENCOUNTER — Telehealth: Payer: 59 | Admitting: Emergency Medicine

## 2020-06-07 DIAGNOSIS — Z20822 Contact with and (suspected) exposure to covid-19: Secondary | ICD-10-CM | POA: Diagnosis not present

## 2020-06-07 DIAGNOSIS — R059 Cough, unspecified: Secondary | ICD-10-CM

## 2020-06-07 MED ORDER — BENZONATATE 100 MG PO CAPS: 100.0000 mg | ORAL_CAPSULE | Freq: Two times a day (BID) | ORAL | 0 refills | Status: DC | PRN

## 2020-06-07 NOTE — Progress Notes (Signed)
E-Visit for Corona Virus Screening  Your current symptoms could be consistent with the coronavirus.    I will treat your cough with tessalon (sent to your pharmacy).  No antibiotics are indicated at this time.  Many health care providers can now test patients at their office but not all are.   has multiple testing sites. For information on our COVID testing locations and hours go to [email or web address]  We are enrolling you in our MyChart Home Monitoring for COVID19 . Daily you will receive a questionnaire within the MyChart website. Our COVID 19 response team will be monitoring your responses daily.  Testing Information: The COVID-19 Community Testing sites are testing BY APPOINTMENT ONLY.  You can schedule online at [email or web address]  If you do not have access to a smart phone or computer you may call [phone] for an appointment.   Additional testing sites in the Community:  . For [facility] Testing sites in [facility]  [email or web address]  . For Pop-up testing sites in [place]  [email or web address]  . For [facility] [email or web address]  . For [facility] testing in [place] and [employer] [email or web address]  . For [facility] testing in [facility]   [email or web address]  For  more information about community testing call [phone]   Please quarantine yourself while awaiting your test results. Please stay home for a minimum of 10 days from the first day of illness with improving symptoms and you have had 24 hours of no fever (without the use of Tylenol (Acetaminophen) Motrin (Ibuprofen) or any fever reducing medication).   Also - Do not get tested prior to returning to work because once you have had a positive test the test can stay positive for more than a month in some cases.   You should wear a mask or cloth face covering over your nose and mouth if you must be around other people or animals, including pets (even at home). Try to stay at least 6 feet away from other people. This will protect the people around you.  Please continue good preventive care measures, including:  frequent hand-washing, avoid touching your face, cover coughs/sneezes, stay out of crowds and keep a 6 foot distance from others.  COVID-19 is a respiratory illness with symptoms that are similar to the flu. Symptoms are typically mild to moderate, but there have been cases of severe illness and death due to the virus.   The following symptoms may appear 2-14 days after exposure: . Fever . Cough . Shortness of breath or difficulty breathing . Chills . Repeated shaking with chills . Muscle pain . Headache . Sore throat . New loss of taste or smell . Fatigue . Congestion or runny nose . Nausea or vomiting . Diarrhea  Go to the nearest hospital ED for assessment if fever/cough/breathlessness are severe or illness seems like a threat to life.  It is vitally important that if you feel that you have an infection such as this virus or any other virus that you stay home and away from places where you may spread it to others.  You should avoid contact with people age 81 and older.   You can use medication such as A prescription cough medication called Tessalon Perles 100 mg. You may take 1-2 capsules every 8 hours as needed for cough  You may also take acetaminophen (Tylenol) as needed for fever.  Reduce your risk of any infection by using the same precautions used for avoiding the common cold or flu:  .  Wash your hands often with soap and warm water for at least 20 seconds.  If soap and water are not readily available, use an alcohol-based hand  sanitizer with at least 60% alcohol.  . If coughing or sneezing, cover your mouth and nose by coughing or sneezing into the elbow areas of your shirt or coat, into a tissue or into your sleeve (not your hands). . Avoid shaking hands with others and consider head nods or verbal greetings only. . Avoid touching your eyes, nose, or mouth with unwashed hands.  . Avoid close contact with people who are sick. . Avoid places or events with large numbers of people in one location, like concerts or sporting events. . Carefully consider travel plans you have or are making. . If you are planning any travel outside or inside the [place], visit the CDC's Travelers' Health webpage for the latest health notices. . If you have some symptoms but not all symptoms, continue to monitor at home and seek medical attention if your symptoms worsen. . If you are having a medical emergency, call 911.  HOME CARE . Only take medications as instructed by your medical team. . Drink plenty of fluids and get plenty of rest. . A steam or ultrasonic humidifier can help if you have congestion.   GET HELP RIGHT AWAY IF YOU HAVE EMERGENCY WARNING SIGNS** FOR COVID-19. If you or someone is showing any of these signs seek emergency medical care immediately. Call 911 or proceed to your closest emergency facility if: . You develop worsening high fever. . Trouble breathing . Bluish lips or face . Persistent pain or pressure in the chest . New confusion . Inability to wake or stay awake . You cough up blood. . Your symptoms become more severe  **This list is not all possible symptoms. Contact your medical provider for any symptoms that are sever or concerning to you.  MAKE SURE YOU   Understand these instructions.  Will watch your condition.  Will get help right away if you are not doing well or get worse.  Your e-visit answers were reviewed by a board certified advanced clinical practitioner to complete your personal care  plan.  Depending on the condition, your plan could have included both over the counter or prescription medications.  If there is a problem please reply once you have received a response from your provider.  Your safety is important to [place].  If you have drug allergies check your prescription carefully.    You can use MyChart to ask questions about today's visit, request a non-urgent call back, or ask for a work or school excuse for 24 hours related to this e-Visit. If it has been greater than 24 hours you will need to follow up with your provider, or enter a new e-Visit to address those concerns. You will get an e-mail in the next two days asking about your experience.  I hope that your e-visit has been valuable and will speed your recovery. Thank you for using e-visits.   Approximately 5 minutes was used in reviewing the patient's chart, questionnaire, prescribing medications, and documentation.

## 2020-06-08 ENCOUNTER — Other Ambulatory Visit: Payer: Self-pay | Admitting: Endocrinology

## 2020-06-08 ENCOUNTER — Other Ambulatory Visit: Payer: 59

## 2020-06-08 ENCOUNTER — Other Ambulatory Visit: Payer: Self-pay

## 2020-06-08 DIAGNOSIS — Z20822 Contact with and (suspected) exposure to covid-19: Secondary | ICD-10-CM

## 2020-06-12 LAB — NOVEL CORONAVIRUS, NAA: SARS-CoV-2, NAA: NOT DETECTED

## 2020-06-16 ENCOUNTER — Other Ambulatory Visit: Payer: Self-pay | Admitting: Endocrinology

## 2020-06-28 ENCOUNTER — Ambulatory Visit: Payer: 59 | Admitting: Podiatry

## 2020-06-28 ENCOUNTER — Encounter: Payer: Self-pay | Admitting: Podiatry

## 2020-06-28 ENCOUNTER — Other Ambulatory Visit: Payer: Self-pay

## 2020-06-28 DIAGNOSIS — B351 Tinea unguium: Secondary | ICD-10-CM

## 2020-06-28 DIAGNOSIS — M2042 Other hammer toe(s) (acquired), left foot: Secondary | ICD-10-CM

## 2020-06-28 DIAGNOSIS — M2041 Other hammer toe(s) (acquired), right foot: Secondary | ICD-10-CM | POA: Diagnosis not present

## 2020-06-28 MED ORDER — TERBINAFINE HCL 250 MG PO TABS: ORAL_TABLET | ORAL | 0 refills | Status: DC

## 2020-06-28 NOTE — Progress Notes (Signed)
Subjective:   Patient ID: Brett Walsh, male   DOB: 55 y.o.   MRN: [id number]   HPI Patient presents stating that he has had discoloration of his to hallux nails and that this is concerning for him and he has a history with his mother having had bad nail fungal disease and eventually lost nails.  Patient does not smoke currently and likes to be active   Review of Systems  All other systems reviewed and are negative.       Objective:  Physical Exam Vitals and nursing note reviewed.  Constitutional:      Appearance: He is well-developed and well-nourished.  Cardiovascular:     Pulses: Intact distal pulses.  Pulmonary:     Effort: Pulmonary effort is normal.  Musculoskeletal:        General: Normal range of motion.  Skin:    General: Skin is warm.  Neurological:     Mental Status: He is alert.     Neurovascular status intact muscle strength adequate range of motion adequate.  Patient is found to have discoloration of the hallux nails bilateral distal one third that is localized with slight dryness and redness of his plantar skin which could be related to fungal issues.  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  At this time we are going to do a pulse antifungal of 7 pills a month along with topical medicine and laser and I did explain there is no guarantee this will solve the problem.  I also reviewed the digital deformity of the fifth digit bilateral with rotational components

## 2020-07-02 ENCOUNTER — Other Ambulatory Visit: Payer: Self-pay | Admitting: Endocrinology

## 2020-07-20 ENCOUNTER — Ambulatory Visit (INDEPENDENT_AMBULATORY_CARE_PROVIDER_SITE_OTHER): Payer: 59 | Admitting: *Deleted

## 2020-07-20 ENCOUNTER — Other Ambulatory Visit: Payer: Self-pay

## 2020-07-20 DIAGNOSIS — B351 Tinea unguium: Secondary | ICD-10-CM

## 2020-07-20 NOTE — Progress Notes (Signed)
Patient presents today for the 1st laser treatment. Diagnosed with mycotic nail infection by Dr. Charlsie Merles.   Toenail most affected hallux nails bilateral.  All other systems are negative.  Nails were filed thin. Laser therapy was administered to 1st toenails bilateral and patient tolerated the treatment well. All safety precautions were in place.   Patient is also taking terbinafine pulse and using a topical.   Follow up in 6 weeks for laser # 2.  Picture of nails taken today to document visual progress   Dr. Charlsie Merles would like for him to have 3-4 treatments total so will space these out at 6 week intervals.

## 2020-07-20 NOTE — Patient Instructions (Signed)

## 2020-07-26 ENCOUNTER — Other Ambulatory Visit: Payer: Self-pay

## 2020-07-26 ENCOUNTER — Other Ambulatory Visit (INDEPENDENT_AMBULATORY_CARE_PROVIDER_SITE_OTHER): Payer: 59

## 2020-07-26 DIAGNOSIS — E1165 Type 2 diabetes mellitus with hyperglycemia: Secondary | ICD-10-CM | POA: Diagnosis not present

## 2020-07-26 LAB — HEMOGLOBIN A1C: Hgb A1c MFr Bld: 6.8 % — ABNORMAL HIGH (ref 4.6–6.5)

## 2020-07-26 LAB — COMPREHENSIVE METABOLIC PANEL
ALT: 23 U/L (ref 0–53)
AST: 16 U/L (ref 0–37)
Albumin: 4.4 g/dL (ref 3.5–5.2)
Alkaline Phosphatase: 58 U/L (ref 39–117)
BUN: 25 mg/dL — ABNORMAL HIGH (ref 6–23)
CO2: 26 mEq/L (ref 19–32)
Calcium: 9.6 mg/dL (ref 8.4–10.5)
Chloride: 105 mEq/L (ref 96–112)
Creatinine, Ser: 1.06 mg/dL (ref 0.40–1.50)
GFR: 80.64 mL/min (ref >60.00)
Glucose, Bld: 113 mg/dL — ABNORMAL HIGH (ref 70–99)
Potassium: 4.2 mEq/L (ref 3.5–5.1)
Sodium: 138 mEq/L (ref 135–145)
Total Bilirubin: 0.4 mg/dL (ref 0.2–1.2)
Total Protein: 7.9 g/dL (ref 6.0–8.3)

## 2020-08-02 ENCOUNTER — Other Ambulatory Visit: Payer: Self-pay

## 2020-08-02 ENCOUNTER — Ambulatory Visit: Payer: 59 | Admitting: Endocrinology

## 2020-08-02 ENCOUNTER — Encounter: Payer: Self-pay | Admitting: Endocrinology

## 2020-08-02 VITALS — BP 126/80 | HR 86 | Ht 67.0 in | Wt 221.0 lb

## 2020-08-02 DIAGNOSIS — E1169 Type 2 diabetes mellitus with other specified complication: Secondary | ICD-10-CM

## 2020-08-02 DIAGNOSIS — E669 Obesity, unspecified: Secondary | ICD-10-CM

## 2020-08-02 NOTE — Progress Notes (Signed)
Patient ID: Brett Walsh, male   DOB: Aug 08, 1965, 55 y.o.   MRN: [id number]           Reason for Appointment: Follow-up for Type 2 Diabetes   History of Present Illness:          Date of diagnosis of type 2 diabetes mellitus: 2012       Background history:   At the time of diagnosis he weighed about 268 pounds His blood sugars were markedly increased at that time when he was hospitalized for treatment and sent home on insulin, metformin and Avandia He says that however he was able to lose weight with improved diet and was able to get off medications and insulin  He has been on various treatment since a couple of years later but detailed records are not available, moved from [place] His A1c has been as high as 14.5 in 2017 when he was prescribed Basaglar and Humalog although he does not remember this Subsequently his blood sugar control has been quite variable He was first started on metformin in 06/2016 and the dose was progressively increased  Recent history:   Non-insulin hypoglycemic drugs:  Invokana 300 mg daily, Prandin 1 mg before breakfast, Metformin 500 er mg twice daily, Ozempic 0.5 mg weekly  A1c is improved at 6.8  Fructosamine is 263 last  Current history of diabetes, recent management, blood sugar patterns and problems identified:  He is now able to use the freestyle libre even though it is not covered by his insurance  Generally trying to cut back on carbohydrates and portions  With this his postprandial readings are very well controlled  HIGHEST postprandial reading likely about 200  He does try to take Prandin with breakfast which limits the higher sugars after morning meal  Average blood sugars are under 160 postprandially  He is avoiding high carbohydrate foods like potatoes and corn which tends to make his sugars go higher; usually trying to eat similar meals at lunch and dinner but generally less than dinnertime  Although his blood sugars are  on an average 135 they are somewhat variable and may be close to 100 also including 113 in the lab  He has not done much consistent exercise because of not feeling safe going to the gym  Weight has stayed down  He was switched from Metformin to Metformin ER because of diarrhea but now he thinks it is causing constipation        Side effects from medications have been: Diarrhea from high-dose metformin, constipation from Metformin ER  Glucose data:  CGM use % of time  92  2-week average/GV  137/19  Time in range      93%  % Time Above 180  7  % Time above 250   % Time Below 70      PRE-MEAL Fasting Lunch Dinner Bedtime Overall  Glucose range:       Averages:  135  119  122   137   POST-MEAL PC Breakfast PC Lunch PC Dinner  Glucose range:     Averages:  140  152  157     Self-care: The diet that the patient has been following is: tries to limit sugar, may occasionally have fried food.      Usually eating breakfast at 5 AM and dinner at 7-8 PM                Dietician visit, most recent: Never  Weight history:  Wt Readings  from Last 3 Encounters:  08/02/20 221 lb (100.2 kg)  05/08/20 221 lb (100.2 kg)  04/19/20 224 lb (101.6 kg)    Glycemic control:   Lab Results  Component Value Date   HGBA1C 6.8 (H) 07/26/2020   HGBA1C 7.0 (H) 04/12/2020   HGBA1C 7.9 (H) 12/01/2019   Lab Results  Component Value Date   MICROALBUR <0.7 04/12/2020   LDLCALC 45 02/16/2020   CREATININE 1.06 07/26/2020   Lab Results  Component Value Date   MICRALBCREAT 2.5 04/12/2020    Lab Results  Component Value Date   FRUCTOSAMINE 263 02/02/2020   FRUCTOSAMINE 436 (H) 09/28/2017      Allergies as of 08/02/2020      Reactions   Metformin And Related    GI upset   Protonix [pantoprazole Sodium] Hives      Medication List       Accurate as of August 02, 2020 10:34 AM. If you have any questions, ask your nurse or doctor.        benzonatate 100 MG capsule Commonly known as:  TESSALON Take 1 capsule (100 mg total) by mouth 2 (two) times daily as needed for cough.   FreeStyle Libre 14 Day Sensor Misc USE 1 SENSOR EVERY 14 DAYS   glucose blood test strip Commonly known as: ONE TOUCH ULTRA TEST Use one strip per test. Test blood sugars 1-4 times daily as instructed.   FreeStyle Precision Neo Test test strip Generic drug: glucose blood Use Freestyle Neo test strips to test blood sugars twice daily   Insulin Pen Needle 31G X 8 MM Misc Commonly known as: B-D ULTRAFINE III SHORT PEN USE AS DIRECTED AS NEEDED   Invokana 300 MG Tabs tablet Generic drug: canagliflozin TAKE 1 TABLET(300 MG) BY MOUTH DAILY BEFORE BREAKFAST   loratadine 10 MG tablet Commonly known as: CLARITIN Take 1 tablet (10 mg total) by mouth daily as needed for allergies.   metFORMIN 500 MG 24 hr tablet Commonly known as: GLUCOPHAGE-XR TAKE 2 TABLETS(1000 MG) BY MOUTH DAILY WITH LUNCH   ONE TOUCH SURESOFT Misc Use 1 lancet per test. Test blood sugars 1-4 times per day as instructed.   ONE TOUCH ULTRA MINI w/Device Kit Use meter to check blood sugars 1-4 times daily as instructed.   Ozempic (0.25 or 0.5 MG/DOSE) 2 MG/1.5ML Sopn Generic drug: Semaglutide(0.25 or 0.5MG/DOS) ADMINISTER 0.5 MG UNDER THE SKIN 1 TIME A WEEK   repaglinide 1 MG tablet Commonly known as: PRANDIN TAKE 1 TABLET(1 MG) BY MOUTH DAILY BEFORE SUPPER   rosuvastatin 5 MG tablet Commonly known as: CRESTOR TAKE 1 TABLET(5 MG) BY MOUTH DAILY   terbinafine 250 MG tablet Commonly known as: LamISIL Please take one a day x 7days, repeat every 4 weeks x 4 months   triamcinolone 55 MCG/ACT Aero nasal inhaler Commonly known as: NASACORT Place 2 sprays into the nose daily.   Triumeq 600-50-300 MG tablet Generic drug: abacavir-dolutegravir-lamiVUDine TAKE 1 TABLET BY MOUTH DAILY       Allergies:  Allergies  Allergen Reactions  . Metformin And Related     GI upset  . Protonix [Pantoprazole Sodium] Hives     Past Medical History:  Diagnosis Date  . Cataract   . Deviated septum   . Diabetes mellitus without complication ([place])   . Gallstones 10/2018  . HIV disease [facility])     Past Surgical History:  Procedure Laterality Date  . [place], [id number]    Family History  Problem Relation Age of Onset  . Diabetes Brother   . Diabetes Mother   . Diabetes Father   . Heart failure Father   . Diabetes Sister   . Colon cancer Neg Hx   . Esophageal cancer Neg Hx   . Rectal cancer Neg Hx   . Stomach cancer Neg Hx     Social History:  reports that he has been smoking cigarettes and e-cigarettes. He has smoked for the past 36.00 years. He has quit using smokeless tobacco. He reports current alcohol use of about 1.0 standard drink of alcohol per week. He reports that he does not use drugs.   Review of Systems  Lipid history: Has been on statin drugs with Crestor 5 mg daily since 2019 Lipids are well controlled as follows  Lab Results  Component Value Date   CHOL 102 02/16/2020   CHOL 118 12/01/2019   CHOL 195 04/20/2019   Lab Results  Component Value Date   HDL 37 (L) 02/16/2020   HDL 37.80 (L) 12/01/2019   HDL 37 (L) 04/20/2019   Lab Results  Component Value Date   LDLCALC 45 02/16/2020   LDLCALC 57 12/01/2019   LDLCALC 129 (H) 04/20/2019   Lab Results  Component Value Date   TRIG 121 02/16/2020   TRIG 116.0 12/01/2019   TRIG 171 (H) 04/20/2019   Lab Results  Component Value Date   CHOLHDL 2.8 02/16/2020   CHOLHDL 3 12/01/2019   CHOLHDL 5.3 (H) 04/20/2019   Lab Results  Component Value Date   LDLDIRECT 101.0 06/15/2018           He has history of fatty liver on his imaging studies  Lab Results  Component Value Date   ALT 23 07/26/2020    No history of hypertension:  BP Readings from Last 3 Encounters:  08/02/20 126/80  05/08/20 118/82  04/19/20 128/84    Most recent eye exam was in 2020 but report not available  Most recent foot  exam: 11/21   LABS:  No visits with results within 1 Week(s) from this visit.  Latest known visit with results is:  Lab on 07/26/2020  Component Date Value Ref Range Status  . Sodium 07/26/2020 138  135 - 145 mEq/L Final  . Potassium 07/26/2020 4.2  3.5 - 5.1 mEq/L Final  . Chloride 07/26/2020 105  96 - 112 mEq/L Final  . CO2 07/26/2020 26  19 - 32 mEq/L Final  . Glucose, Bld 07/26/2020 113* 70 - 99 mg/dL Final  . BUN 07/26/2020 25* 6 - 23 mg/dL Final  . Creatinine, Ser 07/26/2020 1.06  0.40 - 1.50 mg/dL Final  . Total Bilirubin 07/26/2020 0.4  0.2 - 1.2 mg/dL Final  . Alkaline Phosphatase 07/26/2020 58  39 - 117 U/L Final  . AST 07/26/2020 16  0 - 37 U/L Final  . ALT 07/26/2020 23  0 - 53 U/L Final  . Total Protein 07/26/2020 7.9  6.0 - 8.3 g/dL Final  . Albumin 07/26/2020 4.4  3.5 - 5.2 g/dL Final  . GFR 07/26/2020 80.64  >60.00 mL/min Final   Calculated using the CKD-EPI Creatinine Equation (2021)  . Calcium 07/26/2020 9.6  8.4 - 10.5 mg/dL Final  . Hgb A1c MFr Bld 07/26/2020 6.8* 4.6 - 6.5 % Final   Glycemic Control Guidelines for People with Diabetes:Non Diabetic:  <6%Goal of Therapy: <7%Additional Action Suggested:  >8%     Physical Examination:  BP 126/80   Pulse 86   Ht  5' 7"  (1.702 m)   Wt 221 lb (100.2 kg)   SpO2 96%   BMI 34.61 kg/m           ASSESSMENT:  Diabetes type 2, with obesity  See history of present illness for detailed discussion of current diabetes management, blood sugar patterns and problems identified  His A1c is 6.8 and progressively improving  Current management is with Metformin ER, Prandin, and Ozempic 0.5 Since he is tolerating Ozempic fairly well he has been able to control his blood sugars fairly well especially postprandial Fasting readings are variably high but not consistently He will likely can benefit from more Metformin but he tends to have GI side effects with higher doses  Weight has leveled off  PLAN:     He will  start walking regularly  May take Prandin at dinnertime if eating a larger carbohydrate meal  Given him a coupon/co-pay card for the freestyle libre which currently is not covered  Otherwise may periodically switch to fingersticks which he will need to do periodically also to compare with freestyle libre   There are no Patient Instructions on file for this visit.      Elayne Snare 08/02/2020, 10:34 AM   Note: This office note was prepared with Dragon voice recognition system technology. Any transcriptional errors that result from this process are unintentional.  This visit occurred during the SARS-CoV-2 public health emergency.  Safety protocols were in place, including screening questions prior to the visit, additional usage of staff PPE, and extensive cleaning of exam room while observing appropriate contact time as indicated for disinfecting solutions.

## 2020-08-16 ENCOUNTER — Other Ambulatory Visit: Payer: Self-pay | Admitting: Endocrinology

## 2020-08-17 ENCOUNTER — Other Ambulatory Visit: Payer: Self-pay | Admitting: Endocrinology

## 2020-08-17 DIAGNOSIS — E1165 Type 2 diabetes mellitus with hyperglycemia: Secondary | ICD-10-CM

## 2020-08-21 ENCOUNTER — Encounter (HOSPITAL_COMMUNITY): Payer: Self-pay

## 2020-08-21 ENCOUNTER — Ambulatory Visit (HOSPITAL_COMMUNITY)
Admission: EM | Admit: 2020-08-21 | Discharge: 2020-08-21 | Disposition: A | Discharge status: Home / Self Care | Payer: 59 | Attending: Physician Assistant | Admitting: Physician Assistant

## 2020-08-21 ENCOUNTER — Ambulatory Visit (INDEPENDENT_AMBULATORY_CARE_PROVIDER_SITE_OTHER): Payer: 59

## 2020-08-21 ENCOUNTER — Other Ambulatory Visit: Payer: Self-pay

## 2020-08-21 ENCOUNTER — Telehealth: Payer: 59 | Admitting: Physician Assistant

## 2020-08-21 DIAGNOSIS — E1136 Type 2 diabetes mellitus with diabetic cataract: Secondary | ICD-10-CM | POA: Diagnosis not present

## 2020-08-21 DIAGNOSIS — Z20822 Contact with and (suspected) exposure to covid-19: Secondary | ICD-10-CM | POA: Insufficient documentation

## 2020-08-21 DIAGNOSIS — R0602 Shortness of breath: Secondary | ICD-10-CM | POA: Diagnosis not present

## 2020-08-21 DIAGNOSIS — F17210 Nicotine dependence, cigarettes, uncomplicated: Secondary | ICD-10-CM | POA: Insufficient documentation

## 2020-08-21 DIAGNOSIS — Z833 Family history of diabetes mellitus: Secondary | ICD-10-CM | POA: Insufficient documentation

## 2020-08-21 DIAGNOSIS — R059 Cough, unspecified: Secondary | ICD-10-CM

## 2020-08-21 DIAGNOSIS — Z79899 Other long term (current) drug therapy: Secondary | ICD-10-CM | POA: Insufficient documentation

## 2020-08-21 DIAGNOSIS — Z794 Long term (current) use of insulin: Secondary | ICD-10-CM | POA: Diagnosis not present

## 2020-08-21 DIAGNOSIS — J189 Pneumonia, unspecified organism: Secondary | ICD-10-CM

## 2020-08-21 MED ORDER — BENZONATATE 100 MG PO CAPS: 100.0000 mg | ORAL_CAPSULE | Freq: Three times a day (TID) | ORAL | 0 refills | Status: AC | PRN

## 2020-08-21 MED ORDER — ALBUTEROL SULFATE HFA 108 (90 BASE) MCG/ACT IN AERS: 1.0000 | INHALATION_SPRAY | Freq: Four times a day (QID) | RESPIRATORY_TRACT | 0 refills | Status: DC | PRN

## 2020-08-21 NOTE — ED Provider Notes (Signed)
[place]    CSN: [id number] Arrival date & time: 08/21/20  1654      History   Chief Complaint Chief Complaint  Patient presents with  . Cough    HPI Brett Walsh is a 55 y.o. male.   Pt complains of productive cough that started 4 days ago.  Denies fever, chills, n/v/d.  He does endorse some intermittent shortness of breath and states he has had some night sweats.  Pt takes daily nasocort and allergy medication.  He denies sick contacts.  He has had his COVID vaccine and booster.  He did an evisit earlier today and was advised to be seen.      Past Medical History:  Diagnosis Date  . Cataract   . Deviated septum   . Diabetes mellitus without complication ([place])   . Gallstones 10/2018  . HIV disease [facility])     Patient Active Problem List   Diagnosis Date Noted  . Mixed hyperlipidemia 06/11/2018  . Viral gastroenteritis 06/01/2018  . Morbid obesity ([place]) 02/02/2018  . Hyperlipidemia associated with type 2 diabetes mellitus ([place]) 02/02/2018  . Acute non intractable tension-type headache 11/12/2017  . Contusion of face 02/06/2017  . GERD (gastroesophageal reflux disease) 12/02/2016  . Medication reaction 10/14/2016  . Depression (emotion) 07/23/2016  . Insomnia 07/23/2016  . Routine general medical examination at a health care facility 06/20/2016  . TMJ (temporomandibular joint syndrome) 01/21/2016  . Slow transit constipation 08/16/2014  . HIV disease ([place]) 06/20/2014  . Diabetes type 2, uncontrolled ([place]) 06/20/2014    Past Surgical History:  Procedure Laterality Date  . [place], [id number]       Home Medications    Prior to Admission medications   Medication Sig Start Date End Date Taking? Authorizing Provider  albuterol (VENTOLIN HFA) 108 (90 Base) MCG/ACT inhaler Inhale 1-2 puffs into the lungs every 6 (six) hours as needed for wheezing or shortness of breath. 08/21/20  Yes Adaleah Forget, PA-C  benzonatate  (TESSALON) 100 MG capsule Take 1 capsule (100 mg total) by mouth 3 (three) times daily as needed for up to 7 days for cough. 08/21/20 08/28/20  Konrad Felix, PA-C  Blood Glucose Monitoring Suppl (ONE TOUCH ULTRA MINI) w/Device KIT Use meter to check blood sugars 1-4 times daily as instructed. 10/14/16   Golden Circle, FNP  Continuous Blood Gluc Sensor (FREESTYLE LIBRE 14 DAY SENSOR) MISC USE 1 SENSOR EVERY 14 DAYS 07/02/20   Elayne Snare, MD  glucose blood (FREESTYLE PRECISION NEO TEST) test strip Use Freestyle Neo test strips to test blood sugars twice daily 12/08/19   Elayne Snare, MD  glucose blood (ONE TOUCH ULTRA TEST) test strip Use one strip per test. Test blood sugars 1-4 times daily as instructed. 01/14/18   Lance Sell, NP  Insulin Pen Needle (B-D ULTRAFINE III SHORT PEN) 31G X 8 MM MISC USE AS DIRECTED AS NEEDED 07/01/18   Shambley, Delphia Grates, NP  INVOKANA 300 MG TABS tablet TAKE 1 TABLET(300 MG) BY MOUTH DAILY BEFORE BREAKFAST 06/18/20   Elayne Snare, MD  Lancets Misc. (ONE TOUCH SURESOFT) MISC Use 1 lancet per test. Test blood sugars 1-4 times per day as instructed. 10/14/16   Golden Circle, FNP  loratadine (CLARITIN) 10 MG tablet Take 1 tablet (10 mg total) by mouth daily as needed for allergies. 08/03/18   Marrian Salvage, FNP  metFORMIN (GLUCOPHAGE-XR) 500 MG 24 hr tablet TAKE 2 TABLETS(1000 MG) BY MOUTH DAILY WITH  LUNCH 08/17/20   Elayne Snare, MD  OZEMPIC, 0.25 OR 0.5 MG/DOSE, 2 MG/1.5ML SOPN ADMINISTER 0.5 MG UNDER THE SKIN 1 TIME A WEEK 08/16/20   Elayne Snare, MD  repaglinide (PRANDIN) 1 MG tablet TAKE 1 TABLET(1 MG) BY MOUTH DAILY BEFORE SUPPER 06/08/20   Elayne Snare, MD  rosuvastatin (CRESTOR) 5 MG tablet TAKE 1 TABLET(5 MG) BY MOUTH DAILY 01/30/20   Elayne Snare, MD  terbinafine (LAMISIL) 250 MG tablet Please take one a day x 7days, repeat every 4 weeks x 4 months 06/28/20   Wallene Huh, DPM  triamcinolone (NASACORT) 55 MCG/ACT AERO nasal inhaler Place 2 sprays into the  nose daily. 05/08/20   Marrian Salvage, FNP  TRIUMEQ [phone] MG tablet TAKE 1 TABLET BY MOUTH DAILY 03/28/20   Campbell Riches, MD    Family History Family History  Problem Relation Age of Onset  . Diabetes Brother   . Diabetes Mother   . Diabetes Father   . Heart failure Father   . Diabetes Sister   . Colon cancer Neg Hx   . Esophageal cancer Neg Hx   . Rectal cancer Neg Hx   . Stomach cancer Neg Hx     Social History Social History   Tobacco Use  . Smoking status: Light Tobacco Smoker    Years: 36.00    Types: Cigarettes, E-cigarettes  . Smokeless tobacco: Former Systems developer  . Tobacco comment: 3-4/day  Vaping Use  . Vaping Use: Never used  Substance Use Topics  . Alcohol use: Yes    Alcohol/week: 1.0 standard drink    Types: 1 Standard drinks or equivalent per week    Comment: socially  . Drug use: No    Types: Hydromorphone     Allergies   Metformin and related and Protonix [pantoprazole sodium]   Review of Systems Review of Systems  Constitutional: Negative for chills and fever.  HENT: Positive for congestion. Negative for ear pain, sinus pressure, sinus pain and sore throat.   Eyes: Negative for pain and visual disturbance.  Respiratory: Positive for cough and shortness of breath. Negative for apnea and chest tightness.   Cardiovascular: Negative for chest pain and palpitations.  Gastrointestinal: Negative for abdominal pain, nausea and vomiting.  Genitourinary: Negative for dysuria and hematuria.  Musculoskeletal: Negative for arthralgias and back pain.  Skin: Negative for color change and rash.  Neurological: Negative for seizures and syncope.  All other systems reviewed and are negative.    Physical Exam Triage Vital Signs ED Triage Vitals  Enc Vitals Group     BP 08/21/20 1752 131/84     Pulse Rate 08/21/20 1752 90     Resp 08/21/20 1752 18     Temp 08/21/20 1752 97.6 F (36.4 C)     Temp Source 08/21/20 1752 Oral     SpO2 08/21/20  1752 99 %     Weight --      Height --      Head Circumference --      Peak Flow --      Pain Score 08/21/20 1751 0     Pain Loc --      Pain Edu? --      Excl. in [place]? --    No data found.  Updated Vital Signs BP 131/84 (BP Location: Right Arm)   Pulse 90   Temp 97.6 F (36.4 C) (Oral)   Resp 18   SpO2 99%   Visual Acuity Right Eye Distance:  Left Eye Distance:   Bilateral Distance:    Right Eye Near:   Left Eye Near:    Bilateral Near:     Physical Exam Vitals and nursing note reviewed.  Constitutional:      Appearance: He is well-developed.  HENT:     Head: Normocephalic and atraumatic.  Eyes:     Conjunctiva/sclera: Conjunctivae normal.  Cardiovascular:     Rate and Rhythm: Normal rate and regular rhythm.     Heart sounds: No murmur heard.   Pulmonary:     Effort: Pulmonary effort is normal. No respiratory distress.     Breath sounds: Normal breath sounds. No decreased breath sounds, wheezing, rhonchi or rales.  Abdominal:     Palpations: Abdomen is soft.     Tenderness: There is no abdominal tenderness.  Musculoskeletal:     Cervical back: Neck supple.  Skin:    General: Skin is warm and dry.  Neurological:     Mental Status: He is alert.      UC Treatments / Results  Labs (all labs ordered are listed, but only abnormal results are displayed) Labs Reviewed  SARS CORONAVIRUS 2 (TAT 6-24 HRS)    EKG   Radiology DG Chest 2 View  Result Date: 08/21/2020 CLINICAL DATA:  Cough, shortness of breath EXAM: CHEST - 2 VIEW COMPARISON:  06/12/2017 FINDINGS: The heart size and mediastinal contours are within normal limits. Both lungs are clear. The visualized skeletal structures are unremarkable. IMPRESSION: Negative. Electronically Signed   By: Rolm Baptise M.D.   On: 08/21/2020 18:24    Procedures Procedures (including critical care time)  Medications Ordered in UC Medications - No data to display  Initial Impression / Assessment and Plan / UC  Course  I have reviewed the triage vital signs and the nursing notes.  Pertinent labs & imaging results that were available during my care of the patient were reviewed by me and considered in my medical decision making (see chart for details).     COVID vs. URI. Chest xray normal, vitals WNL, pt well appearing.  Will prescribe tessalon and albuterol prn.  COVID test pending.  Self isolation instructions given.  Return precautions discussed.  Final Clinical Impressions(s) / UC Diagnoses   Final diagnoses:  Cough     Discharge Instructions     Take tessalon pearls as needed for cough.  May use albuterol inhaler as needed for shortness of breath.  COVID test pending.  If positive recommend self isolation for 5 days and then wear a mask around others for 5 days after that.    ED Prescriptions    Medication Sig Dispense Auth. Provider   benzonatate (TESSALON) 100 MG capsule Take 1 capsule (100 mg total) by mouth 3 (three) times daily as needed for up to 7 days for cough. 21 capsule Herschel Fleagle, PA-C   albuterol (VENTOLIN HFA) 108 (90 Base) MCG/ACT inhaler Inhale 1-2 puffs into the lungs every 6 (six) hours as needed for wheezing or shortness of breath. 1 each Konrad Felix, PA-C     PDMP not reviewed this encounter.   Konrad Felix, PA-C 08/21/20 1840

## 2020-08-21 NOTE — Discharge Instructions (Addendum)
Take tessalon pearls as needed for cough.  May use albuterol inhaler as needed for shortness of breath.  COVID test pending.  If positive recommend self isolation for 5 days and then wear a mask around others for 5 days after that.

## 2020-08-21 NOTE — Progress Notes (Signed)
Based on what you shared with me, I feel your condition warrants further evaluation and I recommend that you be seen for a face to face office visit. I am very concerned that you have a pneumonia.  A full assessment including vitals, physical exam and possibly x-ray and blood work is needed to determine the next step of treatment.  Your diabetes and HIV put you at high risk for severe disease.     NOTE: If you entered your credit card information for this eVisit, you will not be charged. You may see a "hold" on your card for the $35 but that hold will drop off and you will not have a charge processed.   If you are having a true medical emergency please call 911.      For an urgent face to face visit, [facility] has five urgent care centers for your convenience:     [facility] at [facility] Directions [phone] [address] [place], [place] [address] . 10 am - 6pm Monday - Friday    [facility] Health Urgent Care Center [facility]) Get Driving Directions [phone] [address] [place], [place] [address] . 10 am to 8 pm Monday-Friday . 12 pm to 8 pm [facility] Urgent Care at [facility] Get Driving Directions [phone] [address] [place], [place] [address] . 8 am to 8 pm Monday-Friday . 9 am to 6 pm Saturday . 11 am to 6 pm Sunday     [facility] Health Urgent Care at [facility] Get Driving Directions  [phone] [address] [place], [place] [address] . 8 am to 8 pm Monday-Friday . 8 am to 4 pm [facility] Urgent Care at [facility] Directions [phone] [address] [place], [place] [address] . 12 pm to 6 pm Monday-Friday      Your e-visit answers were reviewed by a board certified advanced clinical practitioner to complete your personal care plan.  Thank you for using e-Visits.   Greater than 5 minutes, yet less  than 10 minutes of time have been spent researching, coordinating, and implementing care for this patient today

## 2020-08-21 NOTE — ED Triage Notes (Signed)
Pt presents with persistent productive cough X 1 week .

## 2020-08-22 LAB — SARS CORONAVIRUS 2 (TAT 6-24 HRS): SARS Coronavirus 2: NEGATIVE

## 2020-08-29 ENCOUNTER — Other Ambulatory Visit: Payer: Self-pay | Admitting: Endocrinology

## 2020-08-31 ENCOUNTER — Ambulatory Visit (INDEPENDENT_AMBULATORY_CARE_PROVIDER_SITE_OTHER): Payer: 59

## 2020-08-31 ENCOUNTER — Other Ambulatory Visit: Payer: Self-pay

## 2020-08-31 DIAGNOSIS — B351 Tinea unguium: Secondary | ICD-10-CM

## 2020-08-31 NOTE — Progress Notes (Signed)
Patient presents today for the 2nd laser treatment. Diagnosed with mycotic nail infection by Dr. Charlsie Merles.   Toenail most affected hallux nails bilateral.  All other systems are negative.  Nails were filed thin. Laser therapy was administered to 1st toenails bilateral and patient tolerated the treatment well. All safety precautions were in place.   Patient is also taking terbinafine pulse and using a topical.   Follow up in 6 weeks for laser # 3.  Picture of nails taken today to document visual progress   Dr. Charlsie Merles would like for him to have 3-4 treatments total so will space these out at 6 week intervals.

## 2020-10-12 ENCOUNTER — Other Ambulatory Visit: Payer: 59

## 2020-10-19 ENCOUNTER — Other Ambulatory Visit: Payer: 59

## 2020-11-01 ENCOUNTER — Other Ambulatory Visit: Payer: Self-pay | Admitting: Endocrinology

## 2020-11-01 ENCOUNTER — Other Ambulatory Visit: Payer: Self-pay | Admitting: Infectious Diseases

## 2020-11-01 DIAGNOSIS — B20 Human immunodeficiency virus [HIV] disease: Secondary | ICD-10-CM

## 2020-11-09 ENCOUNTER — Other Ambulatory Visit: Payer: 59

## 2020-11-19 ENCOUNTER — Other Ambulatory Visit: Payer: Self-pay | Admitting: Endocrinology

## 2020-11-20 ENCOUNTER — Other Ambulatory Visit: Payer: 59

## 2020-11-20 ENCOUNTER — Other Ambulatory Visit: Payer: Self-pay

## 2020-11-20 ENCOUNTER — Other Ambulatory Visit (HOSPITAL_COMMUNITY)
Admission: RE | Admit: 2020-11-20 | Discharge: 2020-11-20 | Disposition: A | Discharge status: Home / Self Care | Payer: 59 | Source: Ambulatory Visit | Attending: Infectious Diseases | Admitting: Infectious Diseases

## 2020-11-20 DIAGNOSIS — Z79899 Other long term (current) drug therapy: Secondary | ICD-10-CM

## 2020-11-20 DIAGNOSIS — E1165 Type 2 diabetes mellitus with hyperglycemia: Secondary | ICD-10-CM

## 2020-11-20 DIAGNOSIS — Z113 Encounter for screening for infections with a predominantly sexual mode of transmission: Secondary | ICD-10-CM | POA: Insufficient documentation

## 2020-11-20 DIAGNOSIS — B20 Human immunodeficiency virus [HIV] disease: Secondary | ICD-10-CM

## 2020-11-21 LAB — URINE CYTOLOGY ANCILLARY ONLY
Chlamydia: NEGATIVE
Comment: NEGATIVE
Comment: NORMAL
Neisseria Gonorrhea: NEGATIVE

## 2020-11-21 LAB — T-HELPER CELL (CD4) - (RCID CLINIC ONLY)
CD4 % Helper T Cell: 12 % — ABNORMAL LOW (ref 33–65)
CD4 T Cell Abs: 396 /uL — ABNORMAL LOW (ref 400–1790)

## 2020-11-23 LAB — COMPREHENSIVE METABOLIC PANEL
AG Ratio: 1.5 (calc) (ref 1.0–2.5)
ALT: 23 U/L (ref 9–46)
AST: 18 U/L (ref 10–35)
Albumin: 4.7 g/dL (ref 3.6–5.1)
Alkaline phosphatase (APISO): 61 U/L (ref 35–144)
BUN: 20 mg/dL (ref 7–25)
CO2: 28 mmol/L (ref 20–32)
Calcium: 9.9 mg/dL (ref 8.6–10.3)
Chloride: 101 mmol/L (ref 98–110)
Creat: 0.95 mg/dL (ref 0.70–1.33)
Globulin: 3.2 g/dL (calc) (ref 1.9–3.7)
Glucose, Bld: 101 mg/dL — ABNORMAL HIGH (ref 65–99)
Potassium: 5.1 mmol/L (ref 3.5–5.3)
Sodium: 138 mmol/L (ref 135–146)
Total Bilirubin: 0.4 mg/dL (ref 0.2–1.2)
Total Protein: 7.9 g/dL (ref 6.1–8.1)

## 2020-11-23 LAB — CBC
HCT: 43.4 % (ref 38.5–50.0)
Hemoglobin: 14.9 g/dL (ref 13.2–17.1)
MCH: 32.8 pg (ref 27.0–33.0)
MCHC: 34.3 g/dL (ref 32.0–36.0)
MCV: 95.6 fL (ref 80.0–100.0)
MPV: 10.7 fL (ref 7.5–12.5)
Platelets: 307 10*3/uL (ref 140–400)
RBC: 4.54 10*6/uL (ref 4.20–5.80)
RDW: 12.6 % (ref 11.0–15.0)
WBC: 12 10*3/uL — ABNORMAL HIGH (ref 3.8–10.8)

## 2020-11-23 LAB — HIV-1 RNA QUANT-NO REFLEX-BLD
HIV 1 RNA Quant: NOT DETECTED Copies/mL
HIV-1 RNA Quant, Log: NOT DETECTED Log cps/mL

## 2020-11-23 LAB — LIPID PANEL
Cholesterol: 147 mg/dL (ref <200)
HDL: 50 mg/dL (ref >=40)
LDL Cholesterol (Calc): 72 mg/dL (calc)
Non-HDL Cholesterol (Calc): 97 mg/dL (calc) (ref <130)
Total CHOL/HDL Ratio: 2.9 (calc) (ref <5.0)
Triglycerides: 175 mg/dL — ABNORMAL HIGH (ref <150)

## 2020-11-23 LAB — RPR: RPR Ser Ql: NONREACTIVE

## 2020-12-01 ENCOUNTER — Other Ambulatory Visit: Payer: Self-pay | Admitting: Infectious Diseases

## 2020-12-01 DIAGNOSIS — B20 Human immunodeficiency virus [HIV] disease: Secondary | ICD-10-CM

## 2020-12-04 ENCOUNTER — Encounter: Payer: 59 | Admitting: Infectious Diseases

## 2020-12-06 ENCOUNTER — Ambulatory Visit: Payer: 59 | Admitting: Infectious Diseases

## 2020-12-06 ENCOUNTER — Other Ambulatory Visit (INDEPENDENT_AMBULATORY_CARE_PROVIDER_SITE_OTHER): Payer: 59

## 2020-12-06 ENCOUNTER — Encounter: Payer: Self-pay | Admitting: Infectious Diseases

## 2020-12-06 ENCOUNTER — Other Ambulatory Visit: Payer: Self-pay

## 2020-12-06 VITALS — BP 129/83 | HR 86 | Temp 98.0°F | Ht 67.0 in | Wt 243.0 lb

## 2020-12-06 DIAGNOSIS — E1169 Type 2 diabetes mellitus with other specified complication: Secondary | ICD-10-CM | POA: Diagnosis not present

## 2020-12-06 DIAGNOSIS — B20 Human immunodeficiency virus [HIV] disease: Secondary | ICD-10-CM | POA: Diagnosis not present

## 2020-12-06 DIAGNOSIS — E669 Obesity, unspecified: Secondary | ICD-10-CM | POA: Diagnosis not present

## 2020-12-06 DIAGNOSIS — E785 Hyperlipidemia, unspecified: Secondary | ICD-10-CM

## 2020-12-06 DIAGNOSIS — Z113 Encounter for screening for infections with a predominantly sexual mode of transmission: Secondary | ICD-10-CM | POA: Diagnosis not present

## 2020-12-06 DIAGNOSIS — E1165 Type 2 diabetes mellitus with hyperglycemia: Secondary | ICD-10-CM

## 2020-12-06 LAB — COMPREHENSIVE METABOLIC PANEL
ALT: 20 U/L (ref 0–53)
AST: 20 U/L (ref 0–37)
Albumin: 4.5 g/dL (ref 3.5–5.2)
Alkaline Phosphatase: 55 U/L (ref 39–117)
BUN: 25 mg/dL — ABNORMAL HIGH (ref 6–23)
CO2: 23 mEq/L (ref 19–32)
Calcium: 9.2 mg/dL (ref 8.4–10.5)
Chloride: 105 mEq/L (ref 96–112)
Creatinine, Ser: 1.07 mg/dL (ref 0.40–1.50)
GFR: 79.53 mL/min (ref >60.00)
Glucose, Bld: 102 mg/dL — ABNORMAL HIGH (ref 70–99)
Potassium: 3.8 mEq/L (ref 3.5–5.1)
Sodium: 139 mEq/L (ref 135–145)
Total Bilirubin: 0.4 mg/dL (ref 0.2–1.2)
Total Protein: 7.9 g/dL (ref 6.0–8.3)

## 2020-12-06 LAB — LIPID PANEL
Cholesterol: 129 mg/dL (ref 0–200)
HDL: 40.4 mg/dL (ref >39.00)
LDL Cholesterol: 57 mg/dL (ref 0–99)
NonHDL: 88.1
Total CHOL/HDL Ratio: 3
Triglycerides: 157 mg/dL — ABNORMAL HIGH (ref 0.0–149.0)
VLDL: 31.4 mg/dL (ref 0.0–40.0)

## 2020-12-06 LAB — HEMOGLOBIN A1C: Hgb A1c MFr Bld: 6.8 % — ABNORMAL HIGH (ref 4.6–6.5)

## 2020-12-06 NOTE — Progress Notes (Signed)
   Subjective:    Patient ID: Brett Walsh, male  DOB: [birth year], 55 y.o.        MRN: [id number]   HPI 55 yo M with hx of HIV+ since 2008 (when he was hospitalized with pneumonia), (prev on tivicay-descovy and was on atripla prior, developed resistance), also hx of DM2 (2012) on metformin and ozymbic. He was seen in f/u in ID clinic on 06-20-14 and started on triumeq. Has been doing well. FSG have been "very under controlled". A1C 6.8% Has not had ophtho yet this year.  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    Has been taking his ART without issue.    HIV 1 RNA Quant  Date Value  11/20/2020 Not Detected Copies/mL  02/16/2020 <20 Copies/mL  04/20/2019 23 copies/mL (H)   CD4 T Cell Abs (/uL)  Date Value  11/20/2020 396 (L)  02/16/2020 389 (L)  04/20/2019 346 (L)     Health Maintenance  Topic Date Due  . PNEUMOCOCCAL POLYSACCHARIDE VACCINE AGE 50-64 HIGH RISK  Never done  . Pneumococcal Vaccine 35-36 Years old (1 - PCV) Never done  . Zoster Vaccines- Shingrix (1 of 2) Never done  . OPHTHALMOLOGY EXAM  02/17/2018  . COVID-19 Vaccine (4 - Booster for Pfizer series) 06/16/2020  . COLONOSCOPY (Pts 45-12yrs Insurance coverage will need to be confirmed)  05/08/2021 (Originally 12/21/2012)  . INFLUENZA VACCINE  12/31/2020  . URINE MICROALBUMIN  04/12/2021  . FOOT EXAM  04/19/2021  . HEMOGLOBIN A1C  06/08/2021  . TETANUS/TDAP  10/01/2023  . Hepatitis C Screening  Completed  . HIV Screening  Completed  . HPV VACCINES  Aged Out      Review of Systems  Constitutional:  Negative for chills and fever.  Eyes:  Negative for blurred vision.  Respiratory:  Negative for cough and shortness of breath.   Gastrointestinal:  Negative for constipation and diarrhea.  Genitourinary:  Negative for dysuria.  Neurological:  Negative for tingling.   Please see HPI. All other systems reviewed and negative.     Objective:  Physical Exam Vitals reviewed.  Constitutional:      Appearance: Normal  appearance.  HENT:     Mouth/Throat:     Mouth: Mucous membranes are moist.     Pharynx: No oropharyngeal exudate.  Eyes:     Extraocular Movements: Extraocular movements intact.     Pupils: Pupils are equal, round, and reactive to light.  Cardiovascular:     Rate and Rhythm: Normal rate and regular rhythm.  Pulmonary:     Effort: Pulmonary effort is normal.     Breath sounds: Normal breath sounds.  Abdominal:     General: Bowel sounds are normal. There is no distension.     Palpations: Abdomen is soft.     Tenderness: There is no abdominal tenderness.  Musculoskeletal:     Cervical back: Normal range of motion and neck supple.     Right lower leg: No edema.     Left lower leg: No edema.  Neurological:     General: No focal deficit present.     Mental Status: He is alert.           Assessment & Plan:

## 2020-12-06 NOTE — Assessment & Plan Note (Addendum)
He is doing well Will continue his triumeq Labs reviewed with him rtc in 9 months, labs prior Offered/refused condoms.  Not sexually active Suggested his friend call clinic for prep, even for ACTG

## 2020-12-06 NOTE — Assessment & Plan Note (Signed)
Well controlled on statin, his current LFTs are normal.

## 2020-12-06 NOTE — Assessment & Plan Note (Signed)
He's doing much better Appreciate f/u of endo, PCP

## 2020-12-13 ENCOUNTER — Ambulatory Visit: Payer: 59 | Admitting: Endocrinology

## 2020-12-13 ENCOUNTER — Encounter: Payer: Self-pay | Admitting: Endocrinology

## 2020-12-13 ENCOUNTER — Other Ambulatory Visit: Payer: Self-pay

## 2020-12-13 VITALS — BP 118/72 | HR 92 | Ht 66.0 in | Wt 215.0 lb

## 2020-12-13 DIAGNOSIS — E1165 Type 2 diabetes mellitus with hyperglycemia: Secondary | ICD-10-CM

## 2020-12-13 DIAGNOSIS — K760 Fatty (change of) liver, not elsewhere classified: Secondary | ICD-10-CM | POA: Diagnosis not present

## 2020-12-13 DIAGNOSIS — E782 Mixed hyperlipidemia: Secondary | ICD-10-CM

## 2020-12-13 MED ORDER — FREESTYLE PRECISION NEO TEST VI STRP: ORAL_STRIP | 3 refills | Status: DC

## 2020-12-13 MED ORDER — FREESTYLE LIBRE 2 SENSOR MISC: 2.0000 | 3 refills | Status: DC

## 2020-12-13 NOTE — Progress Notes (Signed)
Patient ID: Brett Walsh, male   DOB: 06-Aug-1965, 55 y.o.   MRN: [id number]           Reason for Appointment: Follow-up for Type 2 Diabetes   History of Present Illness:          Date of diagnosis of type 2 diabetes mellitus: 2012       Background history:   At the time of diagnosis he weighed about 268 pounds His blood sugars were markedly increased at that time when he was hospitalized for treatment and sent home on insulin, metformin and Avandia He says that however he was able to lose weight with improved diet and was able to get off medications and insulin  He has been on various treatment since a couple of years later but detailed records are not available, moved from [place] His A1c has been as high as 14.5 in 2017 when he was prescribed Basaglar and Humalog although he does not remember this Subsequently his blood sugar control has been quite variable He was first started on metformin in 06/2016 and the dose was progressively increased  Recent history:   Non-insulin hypoglycemic drugs:  Invokana 300 mg daily, Prandin 1 mg before breakfast, Metformin 500 er mg twice daily, Ozempic 0.5 mg weekly  A1c is at 6.8  Fructosamine is 263 last  Current history of diabetes, recent management, blood sugar patterns and problems identified: He is not able to use the freestyle libre consistently because of technical issues and the sensor failing He has not had success with talking to the company also Although his A1c is about the same difficult to assess his blood sugar patterns He does try to take Prandin with breakfast but he still has an occasional reading over 180 in the morning Also may occasionally have high readings after lunch or dinner Some of the evening high reading may be from eating a cup of grapes 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 job he is not doing any exercise lately but is planning to do so now He is overall trying to manage his portions well Although his morning  sugars are trending higher possibly from dawn phenomenon with his lab glucose was only 102 Weight has come down from his last visit        Side effects from medications have been: Diarrhea from high-dose metformin, constipation from Metformin ER  Current sensor data is incomplete Blood sugars are averaging 121 overall  Previous sensor data:  CGM use % of time  92  2-week average/GV  137/19  Time in range      93%  % Time Above 180  7  % Time above 250   % Time Below 70      PRE-MEAL Fasting Lunch Dinner Bedtime Overall  Glucose range:       Averages:  135  119  122   137   POST-MEAL PC Breakfast PC Lunch PC Dinner  Glucose range:     Averages:  140  152  157     Self-care: The diet that the patient has been following is: tries to limit sugar, may occasionally have fried food.      Usually eating breakfast at 5 AM and dinner at 7-8 PM                Dietician visit, most recent: Never  Weight history:  Wt Readings from Last 3 Encounters:  12/13/20 215 lb (97.5 kg)  12/06/20 243 lb (110.2 kg)  08/02/20 221 lb (  100.2 kg)    Glycemic control:   Lab Results  Component Value Date   HGBA1C 6.8 (H) 12/06/2020   HGBA1C 6.8 (H) 07/26/2020   HGBA1C 7.0 (H) 04/12/2020   Lab Results  Component Value Date   MICROALBUR <0.7 04/12/2020   LDLCALC 57 12/06/2020   CREATININE 1.07 12/06/2020   Lab Results  Component Value Date   MICRALBCREAT 2.5 04/12/2020    Lab Results  Component Value Date   FRUCTOSAMINE 263 02/02/2020   FRUCTOSAMINE 436 (H) 09/28/2017      Allergies as of 12/13/2020       Reactions   Metformin And Related    GI upset   Protonix [pantoprazole Sodium] Hives        Medication List        Accurate as of December 13, 2020  9:45 AM. If you have any questions, ask your nurse or doctor.          albuterol 108 (90 Base) MCG/ACT inhaler Commonly known as: VENTOLIN HFA Inhale 1-2 puffs into the lungs every 6 (six) hours as needed for  wheezing or shortness of breath.   FreeStyle Libre 2 Sensor Misc 2 Devices by Does not apply route every 14 (fourteen) days. What changed: See the new instructions. Changed by: Elayne Snare, MD   FreeStyle Precision Neo Test test strip Generic drug: glucose blood Use Freestyle Neo test strips to test blood sugars twice daily What changed: Another medication with the same name was removed. Continue taking this medication, and follow the directions you see here. Changed by: Elayne Snare, MD   Insulin Pen Needle 31G X 8 MM Misc Commonly known as: B-D ULTRAFINE III SHORT PEN USE AS DIRECTED AS NEEDED   Invokana 300 MG Tabs tablet Generic drug: canagliflozin TAKE 1 TABLET(300 MG) BY MOUTH DAILY BEFORE BREAKFAST   loratadine 10 MG tablet Commonly known as: CLARITIN Take 1 tablet (10 mg total) by mouth daily as needed for allergies.   metFORMIN 500 MG 24 hr tablet Commonly known as: GLUCOPHAGE-XR TAKE 2 TABLETS(1000 MG) BY MOUTH DAILY WITH LUNCH   ONE TOUCH SURESOFT Misc Use 1 lancet per test. Test blood sugars 1-4 times per day as instructed.   ONE TOUCH ULTRA MINI w/Device Kit Use meter to check blood sugars 1-4 times daily as instructed.   Ozempic (0.25 or 0.5 MG/DOSE) 2 MG/1.5ML Sopn Generic drug: Semaglutide(0.25 or 0.5MG/DOS) ADMINISTER 0.5 MG UNDER THE SKIN 1 TIME A WEEK   repaglinide 1 MG tablet Commonly known as: PRANDIN TAKE 1 TABLET(1 MG) BY MOUTH DAILY BEFORE SUPPER   rosuvastatin 5 MG tablet Commonly known as: CRESTOR TAKE 1 TABLET(5 MG) BY MOUTH DAILY   triamcinolone 55 MCG/ACT Aero nasal inhaler Commonly known as: NASACORT Place 2 sprays into the nose daily.   Triumeq 600-50-300 MG tablet Generic drug: abacavir-dolutegravir-lamiVUDine TAKE 1 TABLET BY MOUTH DAILY        Allergies:  Allergies  Allergen Reactions   Metformin And Related     GI upset   Protonix [Pantoprazole Sodium] Hives    Past Medical History:  Diagnosis Date   Cataract     Deviated septum    Diabetes mellitus without complication ([place])    Gallstones 10/2018   HIV disease [facility])     Past Surgical History:  Procedure Laterality Date   NASAL SEPTUM SURGERY  1981, [phone]    Family History  Problem Relation Age of Onset   Diabetes Brother    Diabetes Mother  Diabetes Father    Heart failure Father    Diabetes Sister    Colon cancer Neg Hx    Esophageal cancer Neg Hx    Rectal cancer Neg Hx    Stomach cancer Neg Hx     Social History:  reports that he has been smoking cigarettes and e-cigarettes. He has quit using smokeless tobacco. He reports current alcohol use of about 1.0 standard drink of alcohol per week. He reports that he does not use drugs.   Review of Systems  Lipid history: Has been on statin drugs with Crestor 5 mg daily since 2019 Lipids are well controlled as follows Appears to have mild increase in triglycerides now below normal HDL  Lab Results  Component Value Date   CHOL 129 12/06/2020   CHOL 147 11/20/2020   CHOL 102 02/16/2020   Lab Results  Component Value Date   HDL 40.40 12/06/2020   HDL 50 11/20/2020   HDL 37 (L) 02/16/2020   Lab Results  Component Value Date   LDLCALC 57 12/06/2020   LDLCALC 72 11/20/2020   LDLCALC 45 02/16/2020   Lab Results  Component Value Date   TRIG 157.0 (H) 12/06/2020   TRIG 175 (H) 11/20/2020   TRIG 121 02/16/2020   Lab Results  Component Value Date   CHOLHDL 3 12/06/2020   CHOLHDL 2.9 11/20/2020   CHOLHDL 2.8 02/16/2020   Lab Results  Component Value Date   LDLDIRECT 101.0 06/15/2018           He has history of fatty liver on his imaging studies  Lab Results  Component Value Date   ALT 20 12/06/2020    No history of hypertension:  BP Readings from Last 3 Encounters:  12/13/20 118/72  12/06/20 129/83  08/21/20 131/84     Most recent foot exam: 11/21   LABS:  No visits with results within 1 Week(s) from this visit.  Latest known visit with results  is:  Lab on 12/06/2020  Component Date Value Ref Range Status   Cholesterol 12/06/2020 129  0 - 200 mg/dL Final   ATP III Classification       Desirable:  < 200 mg/dL               Borderline High:  200 - 239 mg/dL          High:  > = 240 mg/dL   Triglycerides 12/06/2020 157.0 (A) 0.0 - 149.0 mg/dL Final   Normal:  <150 mg/dLBorderline High:  150 - 199 mg/dL   HDL 12/06/2020 40.40  >39.00 mg/dL Final   VLDL 12/06/2020 31.4  0.0 - 40.0 mg/dL Final   LDL Cholesterol 12/06/2020 57  0 - 99 mg/dL Final   Total CHOL/HDL Ratio 12/06/2020 3   Final                  Men          Women1/2 Average Risk     3.4          3.3Average Risk          5.0          4.42X Average Risk          9.6          7.13X Average Risk          15.0          11.0  NonHDL 12/06/2020 88.10   Final   NOTE:  Non-HDL goal should be 30 mg/dL higher than patient's LDL goal (i.e. LDL goal of < 70 mg/dL, would have non-HDL goal of < 100 mg/dL)   Sodium 12/06/2020 139  135 - 145 mEq/L Final   Potassium 12/06/2020 3.8  3.5 - 5.1 mEq/L Final   Chloride 12/06/2020 105  96 - 112 mEq/L Final   CO2 12/06/2020 23  19 - 32 mEq/L Final   Glucose, Bld 12/06/2020 102 (A) 70 - 99 mg/dL Final   BUN 12/06/2020 25 (A) 6 - 23 mg/dL Final   Creatinine, Ser 12/06/2020 1.07  0.40 - 1.50 mg/dL Final   Total Bilirubin 12/06/2020 0.4  0.2 - 1.2 mg/dL Final   Alkaline Phosphatase 12/06/2020 55  39 - 117 U/L Final   AST 12/06/2020 20  0 - 37 U/L Final   ALT 12/06/2020 20  0 - 53 U/L Final   Total Protein 12/06/2020 7.9  6.0 - 8.3 g/dL Final   Albumin 12/06/2020 4.5  3.5 - 5.2 g/dL Final   GFR 12/06/2020 79.53  >60.00 mL/min Final   Calculated using the CKD-EPI Creatinine Equation (2021)   Calcium 12/06/2020 9.2  8.4 - 10.5 mg/dL Final   Hgb A1c MFr Bld 12/06/2020 6.8 (A) 4.6 - 6.5 % Final   Glycemic Control Guidelines for People with Diabetes:Non Diabetic:  <6%Goal of Therapy: <7%Additional Action Suggested:  >8%     Physical  Examination:  BP 118/72   Pulse 92   Ht 5' 6"  (1.676 m)   Wt 215 lb (97.5 kg)   SpO2 96%   BMI 34.70 kg/m           ASSESSMENT:  Diabetes type 2, with obesity  See history of present illness for detailed discussion of current diabetes management, blood sugar patterns and problems identified  His A1c is 6.8 and now stable Is able to continue avoiding insulin  Current management is with Metformin ER, Prandin 1 mg once a day, and Ozempic 0.5 weekly  Blood sugar monitoring is somewhat inadequate because of sensor issues Although may have some higher postprandial readings this is not consistent and depends on his carbohydrate intake  Weight has improved from better diet overall  LIPIDS: Well-controlled but triglycerides are variably high, may be related to higher carbohydrate intake at times  Liver functions are consistently normal now and may be benefiting from weight loss  PLAN:    He will start walking or doing other exercise regularly, he thinks that his activity level will be increased with his new job  May take Prandin at dinnertime also if eating a larger carbohydrate meal or fruit Cut back on portions of grapes He will call if he is having consistently high blood sugars He can try the freestyle libre version 2 and also given a prescription for the freestyle fingerstick strips Consider increasing Ozempic if having more consistently high blood sugars Continue 5 mg Crestor  Patient Instructions  Prandin at dinner also for hi carb foods      Elayne Snare 12/13/2020, 9:45 AM   Note: This office note was prepared with Dragon voice recognition system technology. Any transcriptional errors that result from this process are unintentional.  This visit occurred during the SARS-CoV-2 public health emergency.  Safety protocols were in place, including screening questions prior to the visit, additional usage of staff PPE, and extensive cleaning of exam room while observing  appropriate contact time as indicated for disinfecting solutions.

## 2020-12-13 NOTE — Patient Instructions (Signed)
Prandin at dinner also for hi carb foods

## 2020-12-14 ENCOUNTER — Other Ambulatory Visit: Payer: Self-pay | Admitting: Endocrinology

## 2020-12-16 ENCOUNTER — Other Ambulatory Visit: Payer: Self-pay | Admitting: Endocrinology

## 2020-12-16 DIAGNOSIS — E1165 Type 2 diabetes mellitus with hyperglycemia: Secondary | ICD-10-CM

## 2020-12-31 ENCOUNTER — Other Ambulatory Visit: Payer: Self-pay | Admitting: Infectious Diseases

## 2020-12-31 DIAGNOSIS — B20 Human immunodeficiency virus [HIV] disease: Secondary | ICD-10-CM

## 2021-01-22 ENCOUNTER — Other Ambulatory Visit: Payer: Self-pay | Admitting: Endocrinology

## 2021-01-24 ENCOUNTER — Telehealth: Payer: 59 | Admitting: Physician Assistant

## 2021-01-24 DIAGNOSIS — A084 Viral intestinal infection, unspecified: Secondary | ICD-10-CM | POA: Diagnosis not present

## 2021-01-24 MED ORDER — ONDANSETRON 4 MG PO TBDP: 4.0000 mg | ORAL_TABLET | Freq: Three times a day (TID) | ORAL | 0 refills | Status: DC | PRN

## 2021-01-24 NOTE — Progress Notes (Signed)
I have spent 5 minutes in review of e-visit questionnaire, review and updating patient chart, medical decision making and response to patient.   Enora Trillo Cody Yuma Blucher, PA-C    

## 2021-01-24 NOTE — Progress Notes (Signed)
We are sorry that you are not feeling well.  Here is how we plan to help!  Based on what you have shared with me it looks like you have Acute Infectious Diarrhea.  Most cases of acute diarrhea are due to infections with virus and bacteria and are self-limited conditions lasting less than 14 days.  For your symptoms you may take Imodium 2 mg tablets that are over the counter at your local pharmacy. Take two tablet now and then one after each loose stool up to 6 a day.  Antibiotics are not needed for most people with diarrhea.  Optional: Zofran 4 mg 1 tablet every 8 hours as needed for nausea and vomiting  I will send a work note to your MyChart to cover days missed.  For future reference thought we are not supposed to cover days missed prior to visit so if you get sick in the future and you are not checking in with your primary care provider, you may want to submit an e-visit sooner to make sure we can cover you for work!!  HOME CARE We recommend changing your diet to help with your symptoms for the next few days. Drink plenty of fluids that contain water salt and sugar. Sports drinks such as Gatorade may help.  You may try broths, soups, bananas, applesauce, soft breads, mashed potatoes or crackers.  You are considered infectious for as long as the diarrhea continues. Hand washing or use of alcohol based hand sanitizers is recommend. It is best to stay out of work or school until your symptoms stop.   GET HELP RIGHT AWAY If you have dark yellow colored urine or do not pass urine frequently you should drink more fluids.   If your symptoms worsen  If you feel like you are going to pass out (faint) You have a new problem  MAKE SURE YOU  Understand these instructions. Will watch your condition. Will get help right away if you are not doing well or get worse.  Thank you for choosing an e-visit.  Your e-visit answers were reviewed by a board certified advanced clinical practitioner to  complete your personal care plan. Depending upon the condition, your plan could have included both over the counter or prescription medications.  Please review your pharmacy choice. Make sure the pharmacy is open so you can pick up prescription now. If there is a problem, you may contact your provider through [employer] and have the prescription routed to another pharmacy.  Your safety is important to [place]. If you have drug allergies check your prescription carefully.   For the next 24 hours you can use MyChart to ask questions about today's visit, request a non-urgent call back, or ask for a work or school excuse. You will get an email in the next two days asking about your experience. I hope that your e-visit has been valuable and will speed your recovery.

## 2021-01-26 ENCOUNTER — Other Ambulatory Visit: Payer: Self-pay | Admitting: Endocrinology

## 2021-02-02 ENCOUNTER — Other Ambulatory Visit: Payer: Self-pay | Admitting: Endocrinology

## 2021-03-15 ENCOUNTER — Other Ambulatory Visit: Payer: 59

## 2021-03-20 ENCOUNTER — Telehealth: Payer: 59 | Admitting: Nurse Practitioner

## 2021-03-20 DIAGNOSIS — R0989 Other specified symptoms and signs involving the circulatory and respiratory systems: Secondary | ICD-10-CM | POA: Diagnosis not present

## 2021-03-20 MED ORDER — FLUTICASONE PROPIONATE 50 MCG/ACT NA SUSP: 2.0000 | Freq: Every day | NASAL | 6 refills | Status: AC

## 2021-03-20 NOTE — Progress Notes (Signed)
E-Visit for Upper Respiratory Infection   We are sorry you are not feeling well.  Here is how we plan to help!  Work note Is in your my chart   Based on what you have shared with me, it looks like you may have a viral upper respiratory infection.  Upper respiratory infections are caused by a large number of viruses; however, rhinovirus is the most common cause.   Symptoms vary from person to person, with common symptoms including sore throat, cough, fatigue or lack of energy and feeling of general discomfort.  A low-grade fever of up to 100.4 may present, but is often uncommon.  Symptoms vary however, and are closely related to a person's age or underlying illnesses.  The most common symptoms associated with an upper respiratory infection are nasal discharge or congestion, cough, sneezing, headache and pressure in the ears and face.  These symptoms usually persist for about 3 to 10 days, but can last up to 2 weeks.  It is important to know that upper respiratory infections do not cause serious illness or complications in most cases.    Upper respiratory infections can be transmitted from person to person, with the most common method of transmission being a person's hands.  The virus is able to live on the skin and can infect other persons for up to 2 hours after direct contact.  Also, these can be transmitted when someone coughs or sneezes; thus, it is important to cover the mouth to reduce this risk.  To keep the spread of the illness at bay, good hand hygiene is very important.  This is an infection that is most likely caused by a virus. There are no specific treatments other than to help you with the symptoms until the infection runs its course.  We are sorry you are not feeling well.  Here is how we plan to help!   For nasal congestion, you may use an oral decongestants such as Mucinex D or if you have glaucoma or high blood pressure use plain Mucinex.  Saline nasal spray or nasal drops can help  and can safely be used as often as needed for congestion.  For your congestion, I have prescribed Fluticasone nasal spray one spray in each nostril twice a day  If you do not have a history of heart disease, hypertension, diabetes or thyroid disease, prostate/bladder issues or glaucoma, you may also use Sudafed to treat nasal congestion.  It is highly recommended that you consult with a pharmacist or your primary care physician to ensure this medication is safe for you to take.     If you have a cough, you may use cough suppressants such as Delsym and Robitussin.  If you have glaucoma or high blood pressure, you can also use Coricidin HBP.   For cough I have prescribed for you robitussin DM OTC   If you have a sore or scratchy throat, use a saltwater gargle-  to  teaspoon of salt dissolved in a 4-ounce to 8-ounce glass of warm water.  Gargle the solution for approximately 15-30 seconds and then spit.  It is important not to swallow the solution.  You can also use throat lozenges/cough drops and Chloraseptic spray to help with throat pain or discomfort.  Warm or cold liquids can also be helpful in relieving throat pain.  For headache, pain or general discomfort, you can use Ibuprofen or Tylenol as directed.   Some authorities believe that zinc sprays or the use of Echinacea  may shorten the course of your symptoms.   HOME CARE Only take medications as instructed by your medical team. Be sure to drink plenty of fluids. Water is fine as well as fruit juices, sodas and electrolyte beverages. You may want to stay away from caffeine or alcohol. If you are nauseated, try taking small sips of liquids. How do you know if you are getting enough fluid? Your urine should be a pale yellow or almost colorless. Get rest. Taking a steamy shower or using a humidifier may help nasal congestion and ease sore throat pain. You can place a towel over your head and breathe in the steam from hot water coming from a  faucet. Using a saline nasal spray works much the same way. Cough drops, hard candies and sore throat lozenges may ease your cough. Avoid close contacts especially the very young and the elderly Cover your mouth if you cough or sneeze Always remember to wash your hands.   GET HELP RIGHT AWAY IF: You develop worsening fever. If your symptoms do not improve within 10 days You develop yellow or green discharge from your nose over 3 days. You have coughing fits You develop a severe head ache or visual changes. You develop shortness of breath, difficulty breathing or start having chest pain Your symptoms persist after you have completed your treatment plan  MAKE SURE YOU  Understand these instructions. Will watch your condition. Will get help right away if you are not doing well or get worse.  Thank you for choosing an e-visit.  Your e-visit answers were reviewed by a board certified advanced clinical practitioner to complete your personal care plan. Depending upon the condition, your plan could have included both over the counter or prescription medications.  Please review your pharmacy choice. Make sure the pharmacy is open so you can pick up prescription now. If there is a problem, you may contact your provider through [employer] and have the prescription routed to another pharmacy.  Your safety is important to [place]. If you have drug allergies check your prescription carefully.   For the next 24 hours you can use MyChart to ask questions about today's visit, request a non-urgent call back, or ask for a work or school excuse. You will get an email in the next two days asking about your experience. I hope that your e-visit has been valuable and will speed your recovery.  5-10 minutes spent reviewing and documenting in chart.

## 2021-03-28 ENCOUNTER — Other Ambulatory Visit: Payer: Self-pay | Admitting: Endocrinology

## 2021-04-02 ENCOUNTER — Telehealth: Payer: 59 | Admitting: Physician Assistant

## 2021-04-02 DIAGNOSIS — J069 Acute upper respiratory infection, unspecified: Secondary | ICD-10-CM | POA: Diagnosis not present

## 2021-04-02 DIAGNOSIS — B9689 Other specified bacterial agents as the cause of diseases classified elsewhere: Secondary | ICD-10-CM

## 2021-04-02 MED ORDER — AMOXICILLIN-POT CLAVULANATE 875-125 MG PO TABS: 1.0000 | ORAL_TABLET | Freq: Two times a day (BID) | ORAL | 0 refills | Status: DC

## 2021-04-02 NOTE — Progress Notes (Signed)

## 2021-04-10 ENCOUNTER — Other Ambulatory Visit: Payer: Self-pay

## 2021-04-10 ENCOUNTER — Telehealth: Payer: Self-pay

## 2021-04-10 ENCOUNTER — Other Ambulatory Visit: Payer: 59

## 2021-04-10 DIAGNOSIS — E1165 Type 2 diabetes mellitus with hyperglycemia: Secondary | ICD-10-CM | POA: Diagnosis not present

## 2021-04-10 NOTE — Telephone Encounter (Signed)
FMLA forms dropped off by patient for provider to fill out. Placed in providers box.   Arianna Haydon Loyola Mast, RN

## 2021-04-11 LAB — MICROALBUMIN / CREATININE URINE RATIO
Creatinine,U: 19.4 mg/dL
Microalb Creat Ratio: 3.6 mg/g (ref 0.0–30.0)
Microalb, Ur: <0.7 mg/dL (ref 0.0–1.9)

## 2021-04-11 LAB — BASIC METABOLIC PANEL
BUN: 26 mg/dL — ABNORMAL HIGH (ref 6–23)
CO2: 26 mEq/L (ref 19–32)
Calcium: 9.1 mg/dL (ref 8.4–10.5)
Chloride: 100 mEq/L (ref 96–112)
Creatinine, Ser: 1.56 mg/dL — ABNORMAL HIGH (ref 0.40–1.50)
GFR: 50.47 mL/min — ABNORMAL LOW (ref >60.00)
Glucose, Bld: 134 mg/dL — ABNORMAL HIGH (ref 70–99)
Potassium: 4.1 mEq/L (ref 3.5–5.1)
Sodium: 135 mEq/L (ref 135–145)

## 2021-04-11 LAB — HEMOGLOBIN A1C: Hgb A1c MFr Bld: 7.5 % — ABNORMAL HIGH (ref 4.6–6.5)

## 2021-04-12 ENCOUNTER — Other Ambulatory Visit: Payer: 59

## 2021-04-18 ENCOUNTER — Ambulatory Visit: Payer: 59 | Admitting: Endocrinology

## 2021-04-18 NOTE — Progress Notes (Incomplete)
Patient ID: Brett Walsh, male   DOB: 05-Jan-1966, 55 y.o.   MRN: [id number]           Reason for Appointment: Follow-up for Type 2 Diabetes   History of Present Illness:          Date of diagnosis of type 2 diabetes mellitus: 2012       Background history:   At the time of diagnosis he weighed about 268 pounds His blood sugars were markedly increased at that time when he was hospitalized for treatment and sent home on insulin, metformin and Avandia He says that however he was able to lose weight with improved diet and was able to get off medications and insulin  He has been on various treatment since a couple of years later but detailed records are not available, moved from [place] His A1c has been as high as 14.5 in 2017 when he was prescribed Basaglar and Humalog although he does not remember this Subsequently his blood sugar control has been quite variable He was first started on metformin in 06/2016 and the dose was progressively increased  Recent history:   Non-insulin hypoglycemic drugs:  Invokana 300 mg daily, Prandin 1 mg before breakfast, Metformin 500 er mg twice daily, Ozempic 0.5 mg weekly  A1c is at 6.8  Fructosamine is 263 last  Current history of diabetes, recent management, blood sugar patterns and problems identified: He is not able to use the freestyle libre consistently because of technical issues and the sensor failing He has not had success with talking to the company also Although his A1c is about the same difficult to assess his blood sugar patterns He does try to take Prandin with breakfast but he still has an occasional reading over 180 in the morning Also may occasionally have high readings after lunch or dinner Some of the evening high reading may be from eating a cup of grapes 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 job he is not doing any exercise lately but is planning to do so now He is overall trying to manage his portions well Although his morning  sugars are trending higher possibly from dawn phenomenon with his lab glucose was only 102 Weight has come down from his last visit        Side effects from medications have been: Diarrhea from high-dose metformin, constipation from Metformin ER  Current sensor data is incomplete Blood sugars are averaging 121 overall  Previous sensor data:  CGM use % of time  92  2-week average/GV  137/19  Time in range      93%  % Time Above 180  7  % Time above 250   % Time Below 70      PRE-MEAL Fasting Lunch Dinner Bedtime Overall  Glucose range:       Averages:  135  119  122   137   POST-MEAL PC Breakfast PC Lunch PC Dinner  Glucose range:     Averages:  140  152  157     Self-care: The diet that the patient has been following is: tries to limit sugar, may occasionally have fried food.      Usually eating breakfast at 5 AM and dinner at 7-8 PM                Dietician visit, most recent: Never  Weight history:  Wt Readings from Last 3 Encounters:  12/13/20 215 lb (97.5 kg)  12/06/20 243 lb (110.2 kg)  08/02/20 221 lb (  100.2 kg)    Glycemic control:   Lab Results  Component Value Date   HGBA1C 7.5 (H) 04/10/2021   HGBA1C 6.8 (H) 12/06/2020   HGBA1C 6.8 (H) 07/26/2020   Lab Results  Component Value Date   MICROALBUR <0.7 04/10/2021   LDLCALC 57 12/06/2020   CREATININE 1.56 (H) 04/10/2021   Lab Results  Component Value Date   MICRALBCREAT 3.6 04/10/2021    Lab Results  Component Value Date   FRUCTOSAMINE 263 02/02/2020   FRUCTOSAMINE 436 (H) 09/28/2017      Allergies as of 04/18/2021       Reactions   Metformin And Related    GI upset   Protonix [pantoprazole Sodium] Hives        Medication List        Accurate as of April 18, 2021  3:28 PM. If you have any questions, ask your nurse or doctor.          albuterol 108 (90 Base) MCG/ACT inhaler Commonly known as: VENTOLIN HFA Inhale 1-2 puffs into the lungs every 6 (six) hours as needed  for wheezing or shortness of breath.   amoxicillin-clavulanate 875-125 MG tablet Commonly known as: AUGMENTIN Take 1 tablet by mouth 2 (two) times daily.   fluticasone 50 MCG/ACT nasal spray Commonly known as: FLONASE Place 2 sprays into both nostrils daily.   FreeStyle Libre 2 Sensor Misc 2 Devices by Does not apply route every 14 (fourteen) days.   FreeStyle Precision Neo Test test strip Generic drug: glucose blood TEST BLOOS SUGARS TWICE DAILY   Insulin Pen Needle 31G X 8 MM Misc Commonly known as: B-D ULTRAFINE III SHORT PEN USE AS DIRECTED AS NEEDED   Invokana 300 MG Tabs tablet Generic drug: canagliflozin TAKE 1 TABLET(300 MG) BY MOUTH DAILY BEFORE BREAKFAST   loratadine 10 MG tablet Commonly known as: CLARITIN Take 1 tablet (10 mg total) by mouth daily as needed for allergies.   metFORMIN 500 MG 24 hr tablet Commonly known as: GLUCOPHAGE-XR TAKE 2 TABLETS(1000 MG) BY MOUTH DAILY WITH LUNCH   ondansetron 4 MG disintegrating tablet Commonly known as: Zofran ODT Take 1 tablet (4 mg total) by mouth every 8 (eight) hours as needed for nausea or vomiting.   ONE TOUCH SURESOFT Misc Use 1 lancet per test. Test blood sugars 1-4 times per day as instructed.   ONE TOUCH ULTRA MINI w/Device Kit Use meter to check blood sugars 1-4 times daily as instructed.   Ozempic (0.25 or 0.5 MG/DOSE) 2 MG/1.5ML Sopn Generic drug: Semaglutide(0.25 or 0.5MG/DOS) ADMINISTER 0.5 MG UNDER THE SKIN ONCE A WEEK   repaglinide 1 MG tablet Commonly known as: PRANDIN TAKE 1 TABLET(1 MG) BY MOUTH DAILY BEFORE SUPPER   rosuvastatin 5 MG tablet Commonly known as: CRESTOR TAKE 1 TABLET(5 MG) BY MOUTH DAILY   triamcinolone 55 MCG/ACT Aero nasal inhaler Commonly known as: NASACORT Place 2 sprays into the nose daily.   Triumeq 600-50-300 MG tablet Generic drug: abacavir-dolutegravir-lamiVUDine TAKE 1 TABLET BY MOUTH DAILY        Allergies:  Allergies  Allergen Reactions   Metformin  And Related     GI upset   Protonix [Pantoprazole Sodium] Hives    Past Medical History:  Diagnosis Date   Cataract    Deviated septum    Diabetes mellitus without complication ([place])    Gallstones 10/2018   HIV disease ([place])     Past Surgical History:  Procedure Laterality Date   NASAL SEPTUM SURGERY  74, [id number]    Family History  Problem Relation Age of Onset   Diabetes Brother    Diabetes Mother    Diabetes Father    Heart failure Father    Diabetes Sister    Colon cancer Neg Hx    Esophageal cancer Neg Hx    Rectal cancer Neg Hx    Stomach cancer Neg Hx     Social History:  reports that he has been smoking cigarettes and e-cigarettes. He has quit using smokeless tobacco. He reports current alcohol use of about 1.0 standard drink per week. He reports that he does not use drugs.   Review of Systems  Lipid history: Has been on statin drugs with Crestor 5 mg daily since 2019 Lipids are well controlled as follows Appears to have mild increase in triglycerides now below normal HDL  Lab Results  Component Value Date   CHOL 129 12/06/2020   CHOL 147 11/20/2020   CHOL 102 02/16/2020   Lab Results  Component Value Date   HDL 40.40 12/06/2020   HDL 50 11/20/2020   HDL 37 (L) 02/16/2020   Lab Results  Component Value Date   LDLCALC 57 12/06/2020   LDLCALC 72 11/20/2020   LDLCALC 45 02/16/2020   Lab Results  Component Value Date   TRIG 157.0 (H) 12/06/2020   TRIG 175 (H) 11/20/2020   TRIG 121 02/16/2020   Lab Results  Component Value Date   CHOLHDL 3 12/06/2020   CHOLHDL 2.9 11/20/2020   CHOLHDL 2.8 02/16/2020   Lab Results  Component Value Date   LDLDIRECT 101.0 06/15/2018           He has history of fatty liver on his imaging studies  Lab Results  Component Value Date   ALT 20 12/06/2020    No history of hypertension:  BP Readings from Last 3 Encounters:  12/13/20 118/72  12/06/20 129/83  08/21/20 131/84   Lab  Results  Component Value Date   CREATININE 1.56 (H) 04/10/2021   CREATININE 1.07 12/06/2020   CREATININE 0.95 11/20/2020     Most recent foot exam: 11/21   LABS:  No visits with results within 1 Week(s) from this visit.  Latest known visit with results is:  Lab on 04/10/2021  Component Date Value Ref Range Status   Microalb, Ur 04/10/2021 <0.7  0.0 - 1.9 mg/dL Final   Creatinine,U 04/10/2021 19.4  mg/dL Final   Microalb Creat Ratio 04/10/2021 3.6  0.0 - 30.0 mg/g Final   Sodium 04/10/2021 135  135 - 145 mEq/L Final   Potassium 04/10/2021 4.1  3.5 - 5.1 mEq/L Final   Chloride 04/10/2021 100  96 - 112 mEq/L Final   CO2 04/10/2021 26  19 - 32 mEq/L Final   Glucose, Bld 04/10/2021 134 (H)  70 - 99 mg/dL Final   BUN 04/10/2021 26 (H)  6 - 23 mg/dL Final   Creatinine, Ser 04/10/2021 1.56 (H)  0.40 - 1.50 mg/dL Final   GFR 04/10/2021 50.47 (L)  >60.00 mL/min Final   Calculated using the CKD-EPI Creatinine Equation (2021)   Calcium 04/10/2021 9.1  8.4 - 10.5 mg/dL Final   Hgb A1c MFr Bld 04/10/2021 7.5 (H)  4.6 - 6.5 % Final   Glycemic Control Guidelines for People with Diabetes:Non Diabetic:  <6%Goal of Therapy: <7%Additional Action Suggested:  >8%     Physical Examination:  There were no vitals taken for this visit.          ASSESSMENT:  Diabetes type 2, with obesity  See history of present illness for detailed discussion of current diabetes management, blood sugar patterns and problems identified  His A1c is 6.8 and now stable Is able to continue avoiding insulin  Current management is with Metformin ER, Prandin 1 mg once a day, and Ozempic 0.5 weekly  Blood sugar monitoring is somewhat inadequate because of sensor issues Although may have some higher postprandial readings this is not consistent and depends on his carbohydrate intake  Weight has improved from better diet overall  LIPIDS: Well-controlled but triglycerides are variably high, may be  related to higher carbohydrate intake at times  Liver functions are consistently normal now and may be benefiting from weight loss  PLAN:    He will start walking or doing other exercise regularly, he thinks that his activity level will be increased with his new job  May take Prandin at dinnertime also if eating a larger carbohydrate meal or fruit Cut back on portions of grapes He will call if he is having consistently high blood sugars He can try the freestyle libre version 2 and also given a prescription for the freestyle fingerstick strips Consider increasing Ozempic if having more consistently high blood sugars Continue 5 mg Crestor  There are no Patient Instructions on file for this visit.      Elayne Snare 04/18/2021, 3:28 PM   Note: This office note was prepared with Dragon voice recognition system technology. Any transcriptional errors that result from this process are unintentional.  This visit occurred during the SARS-CoV-2 public health emergency.  Safety protocols were in place, including screening questions prior to the visit, additional usage of staff PPE, and extensive cleaning of exam room while observing appropriate contact time as indicated for disinfecting solutions.

## 2021-04-19 ENCOUNTER — Telehealth: Payer: Self-pay | Admitting: Infectious Diseases

## 2021-04-19 NOTE — Telephone Encounter (Signed)
Attempted to call pt to fill out his FMLA form No answer and no VM.  Will try again this pm.

## 2021-04-19 NOTE — Telephone Encounter (Signed)
Called pt re: FMLA, no answer ( no VM option)

## 2021-04-22 ENCOUNTER — Ambulatory Visit: Payer: 59 | Admitting: Emergency Medicine

## 2021-04-22 ENCOUNTER — Encounter: Payer: Self-pay | Admitting: Emergency Medicine

## 2021-04-22 ENCOUNTER — Other Ambulatory Visit: Payer: Self-pay

## 2021-04-22 VITALS — BP 136/68 | HR 93 | Temp 98.5°F | Ht 65.0 in | Wt 200.0 lb

## 2021-04-22 DIAGNOSIS — G8929 Other chronic pain: Secondary | ICD-10-CM

## 2021-04-22 DIAGNOSIS — Z23 Encounter for immunization: Secondary | ICD-10-CM

## 2021-04-22 DIAGNOSIS — E1169 Type 2 diabetes mellitus with other specified complication: Secondary | ICD-10-CM

## 2021-04-22 DIAGNOSIS — M25511 Pain in right shoulder: Secondary | ICD-10-CM

## 2021-04-22 DIAGNOSIS — Z7689 Persons encountering health services in other specified circumstances: Secondary | ICD-10-CM

## 2021-04-22 DIAGNOSIS — Z1211 Encounter for screening for malignant neoplasm of colon: Secondary | ICD-10-CM

## 2021-04-22 DIAGNOSIS — N1831 Chronic kidney disease, stage 3a: Secondary | ICD-10-CM | POA: Insufficient documentation

## 2021-04-22 DIAGNOSIS — E669 Obesity, unspecified: Secondary | ICD-10-CM

## 2021-04-22 DIAGNOSIS — E785 Hyperlipidemia, unspecified: Secondary | ICD-10-CM

## 2021-04-22 DIAGNOSIS — B20 Human immunodeficiency virus [HIV] disease: Secondary | ICD-10-CM

## 2021-04-22 NOTE — Assessment & Plan Note (Signed)
Advised to stay well-hydrated and avoid NSAIDs. ?

## 2021-04-22 NOTE — Assessment & Plan Note (Signed)
Diet and nutrition discussed. Continue Ozempic, metformin, and Invokana and follow-up with endocrinologist as scheduled. Continue rosuvastatin 5 mg daily.

## 2021-04-22 NOTE — Progress Notes (Signed)
Brett Walsh 55 y.o.   Chief Complaint  Patient presents with   New Patient (Initial Visit)    Pt would like to discuss arm numbness at night, right, x 2-3 months. Pt believes he has pinch nerve in shoulder    HISTORY OF PRESENT ILLNESS: This is a 55 y.o. male first visit to this office here to establish care with me. Has the following chronic medical problems: 1.  Diabetes: On metformin, Invokana, and Ozempic.  Sees Dr. Dwyane Dee, endocrinologist on a regular basis. 2.  HIV disease: Stable and well-controlled on Triumeq. Today complaining of intermittent right shoulder discomfort with occasional numbness and tingling of the right arm for the past 2 to 3 months.  Had injury last August.  Concerned about pinched nerve. No other complaints or medical concerns today. Needs referral for colonoscopy. Recent blood work reviewed with patient.  Shows increased creatinine and decreased GFR consistent with chronic kidney disease stage III a    HPI   Prior to Admission medications   Medication Sig Start Date End Date Taking? Authorizing Provider  Blood Glucose Monitoring Suppl (ONE TOUCH ULTRA MINI) w/Device KIT Use meter to check blood sugars 1-4 times daily as instructed. 10/14/16  Yes Golden Circle, FNP  Continuous Blood Gluc Sensor (FREESTYLE LIBRE 2 SENSOR) MISC 2 Devices by Does not apply route every 14 (fourteen) days. 12/13/20  Yes Elayne Snare, MD  fluticasone (FLONASE) 50 MCG/ACT nasal spray Place 2 sprays into both nostrils daily. 03/20/21  Yes Martin, Mary-Margaret, FNP  glucose blood (FREESTYLE PRECISION NEO TEST) test strip TEST BLOOS SUGARS TWICE DAILY 03/28/21  Yes Elayne Snare, MD  INVOKANA 300 MG TABS tablet TAKE 1 TABLET(300 MG) BY MOUTH DAILY BEFORE BREAKFAST 01/22/21  Yes Elayne Snare, MD  Lancets Misc. (ONE TOUCH SURESOFT) MISC Use 1 lancet per test. Test blood sugars 1-4 times per day as instructed. 10/14/16  Yes Golden Circle, FNP  loratadine (CLARITIN) 10 MG tablet Take  1 tablet (10 mg total) by mouth daily as needed for allergies. 08/03/18  Yes Marrian Salvage, FNP  metFORMIN (GLUCOPHAGE-XR) 500 MG 24 hr tablet TAKE 2 TABLETS(1000 MG) BY MOUTH DAILY WITH LUNCH 12/17/20  Yes Elayne Snare, MD  OZEMPIC, 0.25 OR 0.5 MG/DOSE, 2 MG/1.5ML SOPN ADMINISTER 0.5 MG UNDER THE SKIN ONCE A WEEK 12/14/20  Yes Elayne Snare, MD  repaglinide (PRANDIN) 1 MG tablet TAKE 1 TABLET(1 MG) BY MOUTH DAILY BEFORE SUPPER 02/05/21  Yes Elayne Snare, MD  rosuvastatin (CRESTOR) 5 MG tablet TAKE 1 TABLET(5 MG) BY MOUTH DAILY 01/27/21  Yes Elayne Snare, MD  TRIUMEQ 600-50-300 MG tablet TAKE 1 TABLET BY MOUTH DAILY 12/31/20  Yes Campbell Riches, MD  albuterol (VENTOLIN HFA) 108 (90 Base) MCG/ACT inhaler Inhale 1-2 puffs into the lungs every 6 (six) hours as needed for wheezing or shortness of breath. Patient not taking: Reported on 12/13/2020 08/21/20   Ward, Lenise Arena, PA-C  amoxicillin-clavulanate (AUGMENTIN) 875-125 MG tablet Take 1 tablet by mouth 2 (two) times daily. 04/02/21   Mar Daring, PA-C  Insulin Pen Needle (B-D ULTRAFINE III SHORT PEN) 31G X 8 MM MISC USE AS DIRECTED AS NEEDED 07/01/18   Lance Sell, NP  ondansetron (ZOFRAN ODT) 4 MG disintegrating tablet Take 1 tablet (4 mg total) by mouth every 8 (eight) hours as needed for nausea or vomiting. Patient not taking: Reported on 04/22/2021 01/24/21   Brunetta Jeans, PA-C  triamcinolone (NASACORT) 55 MCG/ACT AERO nasal inhaler Place 2 sprays into  the nose daily. Patient not taking: Reported on 12/13/2020 05/08/20   Marrian Salvage, FNP    Allergies  Allergen Reactions   Metformin And Related     GI upset   Protonix [Pantoprazole Sodium] Hives    Patient Active Problem List   Diagnosis Date Noted   Mixed hyperlipidemia 06/11/2018   Morbid obesity ([place]) 02/02/2018   Hyperlipidemia associated with type 2 diabetes mellitus ([place]) 02/02/2018   GERD (gastroesophageal reflux disease) 12/02/2016   Depression  (emotion) 07/23/2016   Insomnia 07/23/2016   TMJ (temporomandibular joint syndrome) 01/21/2016   Slow transit constipation 08/16/2014   HIV disease ([place]) 06/20/2014   Diabetes type 2, uncontrolled 06/20/2014    Past Medical History:  Diagnosis Date   Cataract    Deviated septum    Diabetes mellitus without complication ([place])    Gallstones 10/2018   HIV disease ([place])     Past Surgical History:  Procedure Laterality Date   NASAL SEPTUM SURGERY  1981, [phone]    Social History   Socioeconomic History   Marital status: Single    Spouse name: Not on file   Number of children: 0   Years of education: 14   Highest education level: Not on file  Occupational History   Occupation: Financial risk analyst  Tobacco Use   Smoking status: Light Smoker    Years: 36.00    Types: Cigarettes, E-cigarettes   Smokeless tobacco: Former   Tobacco comments:    3-4/day  Vaping Use   Vaping Use: Former  Substance and Sexual Activity   Alcohol use: Yes    Alcohol/week: 1.0 standard drink    Types: 1 Standard drinks or equivalent per week    Comment: socially   Drug use: No    Types: Hydromorphone   Sexual activity: Never    Partners: Male    Comment: pt. declined condoms  Other Topics Concern   Not on file  Social History Narrative   Fun: Watch TV, veg at home.    Social Determinants of Health   Financial Resource Strain: Not on file  Food Insecurity: Not on file  Transportation Needs: Not on file  Physical Activity: Not on file  Stress: Not on file  Social Connections: Not on file  Intimate Partner Violence: Not on file    Family History  Problem Relation Age of Onset   Diabetes Brother    Diabetes Mother    Diabetes Father    Heart failure Father    Diabetes Sister    Colon cancer Neg Hx    Esophageal cancer Neg Hx    Rectal cancer Neg Hx    Stomach cancer Neg Hx      Review of Systems  Constitutional: Negative.  Negative for chills and fever.  HENT:  Negative.  Negative for congestion and sore throat.   Respiratory: Negative.  Negative for cough and shortness of breath.   Cardiovascular: Negative.  Negative for chest pain and palpitations.  Gastrointestinal: Negative.  Negative for abdominal pain, diarrhea, nausea and vomiting.  Genitourinary: Negative.  Negative for dysuria and hematuria.  Musculoskeletal:  Positive for joint pain (Right shoulder).  Skin: Negative.  Negative for rash.  Neurological:  Negative for dizziness and headaches.  All other systems reviewed and are negative.  Today's Vitals   04/22/21 1524  BP: 136/68  Pulse: 93  Temp: 98.5 F (36.9 C)  TempSrc: Oral  SpO2: 94%  Weight: 200 lb (90.7 kg)  Height: 5' 5"  (1.651 m)  Body mass index is 33.28 kg/m.  Physical Exam Vitals reviewed.  Constitutional:      Appearance: Normal appearance.  HENT:     Head: Normocephalic.  Eyes:     Extraocular Movements: Extraocular movements intact.     Pupils: Pupils are equal, round, and reactive to light.  Cardiovascular:     Rate and Rhythm: Normal rate and regular rhythm.     Pulses: Normal pulses.     Heart sounds: Normal heart sounds.  Pulmonary:     Effort: Pulmonary effort is normal.     Breath sounds: Normal breath sounds.  Musculoskeletal:     Cervical back: Normal range of motion. No tenderness.     Comments: Right shoulder: No localized tenderness, erythema, or swelling but complaining of pain during range of motion  Lymphadenopathy:     Cervical: No cervical adenopathy.  Skin:    General: Skin is warm and dry.     Capillary Refill: Capillary refill takes less than 2 seconds.  Neurological:     General: No focal deficit present.     Mental Status: He is alert and oriented to person, place, and time.  Psychiatric:        Mood and Affect: Mood normal.        Behavior: Behavior normal.     ASSESSMENT & PLAN: A total of 47 minutes was spent with the patient and counseling/coordination of care  regarding preparing for this visit, establishing care with me, review of most recent office visit notes, review of most recent blood work results and interpretation, review of all medications, comprehensive history and physical examination, education and nutrition, health maintenance items including need for colon cancer screening with colonoscopy, referral to sports medicine for evaluation of chronic right shoulder pain, documentation, prognosis, and need for follow-up.  Problem List Items Addressed This Visit       Endocrine   Hyperlipidemia associated with type 2 diabetes mellitus ([place])    Diet and nutrition discussed. Continue Ozempic, metformin, and Invokana and follow-up with endocrinologist as scheduled. Continue rosuvastatin 5 mg daily.         Genitourinary   Stage 3a chronic kidney disease ([place])    Advised to stay well-hydrated and avoid NSAIDs.        Other   HIV disease ([place])    Stable.  No recent infections.  Continue Triumeq.      Other Visit Diagnoses     Type 2 diabetes mellitus with obesity ([place])    -  Primary   Need for influenza vaccination       Relevant Orders   Flu Vaccine QUAD 56moIM (Fluarix, Fluzone & Alfiuria Quad PF) (Completed)   Need for pneumococcal vaccination       Relevant Orders   Pneumococcal conjugate vaccine 20-valent (Prevnar 20) (Completed)   Colon cancer screening       Relevant Orders   Ambulatory referral to Gastroenterology   Chronic right shoulder pain       Relevant Orders   Ambulatory referral to Sports Medicine   Encounter to establish care          Patient Instructions  Health Maintenance, Male Adopting a healthy lifestyle and getting preventive care are important in promoting health and wellness. Ask your health care provider about: The right schedule for you to have regular tests and exams. Things you can do on your own to prevent diseases and keep yourself healthy. What should I know about diet, weight, and  exercise?  Eat a healthy diet  Eat a diet that includes plenty of vegetables, fruits, low-fat dairy products, and lean protein. Do not eat a lot of foods that are high in solid fats, added sugars, or sodium. Maintain a healthy weight Body mass index (BMI) is a measurement that can be used to identify possible weight problems. It estimates body fat based on height and weight. Your health care provider can help determine your BMI and help you achieve or maintain a healthy weight. Get regular exercise Get regular exercise. This is one of the most important things you can do for your health. Most adults should: Exercise for at least 150 minutes each week. The exercise should increase your heart rate and make you sweat (moderate-intensity exercise). Do strengthening exercises at least twice a week. This is in addition to the moderate-intensity exercise. Spend less time sitting. Even light physical activity can be beneficial. Watch cholesterol and blood lipids Have your blood tested for lipids and cholesterol at 53 years of age, then have this test every 5 years. You may need to have your cholesterol levels checked more often if: Your lipid or cholesterol levels are high. You are older than 53 years of age. You are at high risk for heart disease. What should I know about cancer screening? Many types of cancers can be detected early and may often be prevented. Depending on your health history and family history, you may need to have cancer screening at various ages. This may include screening for: Colorectal cancer. Prostate cancer. Skin cancer. Lung cancer. What should I know about heart disease, diabetes, and high blood pressure? Blood pressure and heart disease High blood pressure causes heart disease and increases the risk of stroke. This is more likely to develop in people who have high blood pressure readings or are overweight. Talk with your health care provider about your target blood  pressure readings. Have your blood pressure checked: Every 3-5 years if you are 53-68 years of age. Every year if you are 36 years old or older. If you are between the ages of 82 and 34 and are a current or former smoker, ask your health care provider if you should have a one-time screening for abdominal aortic aneurysm (AAA). Diabetes Have regular diabetes screenings. This checks your fasting blood sugar level. Have the screening done: Once every three years after age 66 if you are at a normal weight and have a low risk for diabetes. More often and at a younger age if you are overweight or have a high risk for diabetes. What should I know about preventing infection? Hepatitis B If you have a higher risk for hepatitis B, you should be screened for this virus. Talk with your health care provider to find out if you are at risk for hepatitis B infection. Hepatitis C Blood testing is recommended for: Everyone born from [birth year] through 1963. Anyone with known risk factors for hepatitis C. Sexually transmitted infections (STIs) You should be screened each year for STIs, including gonorrhea and chlamydia, if: You are sexually active and are younger than 53 years of age. You are older than 53 years of age and your health care provider tells you that you are at risk for this type of infection. Your sexual activity has changed since you were last screened, and you are at increased risk for chlamydia or gonorrhea. Ask your health care provider if you are at risk. Ask your health care provider about whether you are at high risk for  HIV. Your health care provider may recommend a prescription medicine to help prevent HIV infection. If you choose to take medicine to prevent HIV, you should first get tested for HIV. You should then be tested every 3 months for as long as you are taking the medicine. Follow these instructions at home: Alcohol use Do not drink alcohol if your health care provider tells you not to  drink. If you drink alcohol: Limit how much you have to 0-2 drinks a day. Know how much alcohol is in your drink. In the [place] one drink equals one 12 oz bottle of beer (355 mL), one 5 oz glass of wine (148 mL), or one 1 oz glass of hard liquor (44 mL). Lifestyle Do not use any products that contain nicotine or tobacco. These products include cigarettes, chewing tobacco, and vaping devices, such as e-cigarettes. If you need help quitting, ask your health care provider. Do not use street drugs. Do not share needles. Ask your health care provider for help if you need support or information about quitting drugs. General instructions Schedule regular health, dental, and eye exams. Stay current with your vaccines. Tell your health care provider if: You often feel depressed. You have ever been abused or do not feel safe at home. Summary Adopting a healthy lifestyle and getting preventive care are important in promoting health and wellness. Follow your health care provider's instructions about healthy diet, exercising, and getting tested or screened for diseases. Follow your health care provider's instructions on monitoring your cholesterol and blood pressure. This information is not intended to replace advice given to you by your health care provider. Make sure you discuss any questions you have with your health care provider. Document Revised: 10/08/2020 Document Reviewed: 10/08/2020 Elsevier Patient Education  2022 [place], MD [facility], [place]

## 2021-04-22 NOTE — Patient Instructions (Signed)

## 2021-04-22 NOTE — Assessment & Plan Note (Signed)
Stable.  No recent infections.  Continue Triumeq.

## 2021-04-23 ENCOUNTER — Telehealth: Payer: Self-pay | Admitting: Infectious Diseases

## 2021-04-23 NOTE — Telephone Encounter (Signed)
Pt did not answer phone. No VM Form completed, faxed.

## 2021-05-22 ENCOUNTER — Telehealth: Payer: 59 | Admitting: Physician Assistant

## 2021-05-22 ENCOUNTER — Ambulatory Visit (HOSPITAL_COMMUNITY)
Admission: EM | Admit: 2021-05-22 | Discharge: 2021-05-22 | Disposition: A | Discharge status: Home / Self Care | Payer: 59 | Attending: Family Medicine | Admitting: Family Medicine

## 2021-05-22 ENCOUNTER — Other Ambulatory Visit: Payer: Self-pay

## 2021-05-22 ENCOUNTER — Encounter (HOSPITAL_COMMUNITY): Payer: Self-pay | Admitting: Emergency Medicine

## 2021-05-22 DIAGNOSIS — J09X2 Influenza due to identified novel influenza A virus with other respiratory manifestations: Secondary | ICD-10-CM | POA: Diagnosis not present

## 2021-05-22 DIAGNOSIS — R079 Chest pain, unspecified: Secondary | ICD-10-CM

## 2021-05-22 DIAGNOSIS — R6889 Other general symptoms and signs: Secondary | ICD-10-CM

## 2021-05-22 LAB — POC INFLUENZA A AND B ANTIGEN (URGENT CARE ONLY)
INFLUENZA A ANTIGEN, POC: POSITIVE — AB
INFLUENZA B ANTIGEN, POC: NEGATIVE

## 2021-05-22 MED ORDER — OSELTAMIVIR PHOSPHATE 75 MG PO CAPS: 75.0000 mg | ORAL_CAPSULE | Freq: Two times a day (BID) | ORAL | 0 refills | Status: AC

## 2021-05-22 MED ORDER — PROMETHAZINE-DM 6.25-15 MG/5ML PO SYRP: 5.0000 mL | ORAL_SOLUTION | Freq: Four times a day (QID) | ORAL | 0 refills | Status: DC | PRN

## 2021-05-22 NOTE — ED Triage Notes (Signed)
Patient c/o fever, lymph node swelling, and productive cough x 1 day.   Patient endorses a temperature of 101 F yesterday at home.   Patient endorses generalized body aches.   Patient has taken Nyquil and Alka seltzer with no relief of symptoms.

## 2021-05-22 NOTE — ED Provider Notes (Signed)
[facility]   [id number] 05/22/21 Arrival Time: 0945  ASSESSMENT & PLAN:  1. Influenza due to identified novel influenza A virus with other respiratory manifestations    Discussed typical duration of viral illnesses. Rapid flu positive. Work note provided. OTC symptom care as needed.  Begin: New Prescriptions   OSELTAMIVIR (TAMIFLU) 75 MG CAPSULE    Take 1 capsule (75 mg total) by mouth 2 (two) times daily for 5 days.   PROMETHAZINE-DEXTROMETHORPHAN (PROMETHAZINE-DM) 6.25-15 MG/5ML SYRUP    Take 5 mLs by mouth 4 (four) times daily as needed for cough.     Follow-up Information     Sagardia, Eilleen Kempf, MD.   Specialty: Internal Medicine Why: As needed. Contact information: [address] [place] [address] [phone]                 Reviewed expectations re: course of current medical issues. Questions answered. Outlined signs and symptoms indicating need for more acute intervention. Understanding verbalized. After Visit Summary given.   SUBJECTIVE: History from: patient. Brett Walsh is a 55 y.o. male who reports: fever, prod cough; x 2 days; abrupt onset; body aches. Denies: difficulty breathing. Normal PO intake without n/v/d.  OBJECTIVE:  Vitals:   05/22/21 1029 05/22/21 1030  BP: 119/78   Pulse: 100   Resp:  15  Temp: 98.1 F (36.7 C)   TempSrc: Oral   SpO2: 100%     General appearance: alert; no distress Eyes: PERRLA; EOMI; conjunctiva normal HENT: [place]; AT; with nasal congestion Neck: supple  Lungs: speaks full sentences without difficulty; unlabored Extremities: no edema Skin: warm and dry Neurologic: normal gait Psychological: alert and cooperative; normal mood and affect  Labs: Results for orders placed or performed during the hospital encounter of 05/22/21  POC Influenza A & B Ag (Urgent Care)  Result Value Ref Range   INFLUENZA A ANTIGEN, POC POSITIVE (A) NEGATIVE   INFLUENZA B ANTIGEN, POC NEGATIVE  NEGATIVE   Labs Reviewed  POC INFLUENZA A AND B ANTIGEN (URGENT CARE ONLY) - Abnormal; Notable for the following components:      Result Value   INFLUENZA A ANTIGEN, POC POSITIVE (*)    All other components within normal limits    Imaging: No results found.  Allergies  Allergen Reactions   Metformin And Related     GI upset   Protonix [Pantoprazole Sodium] Hives    Past Medical History:  Diagnosis Date   Cataract    Deviated septum    Diabetes mellitus without complication ([facility])    Gallstones 10/2018   HIV disease ([facility])    Social History   Socioeconomic History   Marital status: Single    Spouse name: Not on file   Number of children: 0   Years of education: 14   Highest education level: Not on file  Occupational History   Occupation: Risk analyst  Tobacco Use   Smoking status: Light Smoker    Years: 36.00    Types: Cigarettes, E-cigarettes   Smokeless tobacco: Former   Tobacco comments:    3-4/day  Vaping Use   Vaping Use: Former  Substance and Sexual Activity   Alcohol use: Yes    Alcohol/week: 1.0 standard drink    Types: 1 Standard drinks or equivalent per week    Comment: socially   Drug use: No    Types: Hydromorphone   Sexual activity: Never    Partners: Male    Comment: pt. declined condoms  Other  Topics Concern   Not on file  Social History Narrative   Fun: Watch TV, veg at home.    Social Determinants of Health   Financial Resource Strain: Not on file  Food Insecurity: Not on file  Transportation Needs: Not on file  Physical Activity: Not on file  Stress: Not on file  Social Connections: Not on file  Intimate Partner Violence: Not on file   Family History  Problem Relation Age of Onset   Diabetes Brother    Diabetes Mother    Diabetes Father    Heart failure Father    Diabetes Sister    Colon cancer Neg Hx    Esophageal cancer Neg Hx    Rectal cancer Neg Hx    Stomach cancer Neg Hx    Past Surgical History:   Procedure Laterality Date   NASAL SEPTUM SURGERY  1981, [phone]     Mardella Layman, MD 05/22/21 1141

## 2021-05-22 NOTE — Progress Notes (Signed)
Based on what you shared with me, I feel your condition warrants further evaluation and I recommend that you be seen in a face to face visit.  You are mentioning shortness of breath and chest pain along with other symptoms. Due to this, you need to be evaluated in person ASAP to get a detailed examination of heart, lungs and chest wall, and to make sure proper treatment is given.    NOTE: There will be NO CHARGE for this eVisit   If you are having a true medical emergency please call 911.      For an urgent face to face visit, [place] has six urgent care centers for your convenience:     [facility] at [facility] Directions [phone] [address] [place], [place] [address]    [facility]gent [facility] Center [facility]) Get Driving Directions [phone] [address] [place], [place] [address]  [facility] Center [facility]) Get Driving Directions [phone] [address] [place],  [place]  [address]  [facility]gent Care at [facility] Get Driving Directions [phone] [address] [place], [place] [address]   [facility] at [facility] Get Driving Directions  [phone] [address] [place], [place] [address]   [facility] [place] [facility] Health Urgent Care at [facility] Directions [phone] [address] [place], [place] [address]  Your MyChart E-visit questionnaire answers were reviewed by a board certified advanced clinical practitioner to complete your personal care plan based on your specific symptoms.  Thank you for using e-Visits.

## 2021-05-30 ENCOUNTER — Other Ambulatory Visit: Payer: Self-pay | Admitting: Infectious Diseases

## 2021-05-30 DIAGNOSIS — B20 Human immunodeficiency virus [HIV] disease: Secondary | ICD-10-CM

## 2021-07-20 ENCOUNTER — Other Ambulatory Visit: Payer: Self-pay | Admitting: Endocrinology

## 2021-07-20 DIAGNOSIS — E1169 Type 2 diabetes mellitus with other specified complication: Secondary | ICD-10-CM

## 2021-07-20 DIAGNOSIS — E119 Type 2 diabetes mellitus without complications: Secondary | ICD-10-CM

## 2021-07-21 ENCOUNTER — Other Ambulatory Visit: Payer: Self-pay | Admitting: Endocrinology

## 2021-07-22 ENCOUNTER — Other Ambulatory Visit: Payer: 59

## 2021-07-22 ENCOUNTER — Other Ambulatory Visit (INDEPENDENT_AMBULATORY_CARE_PROVIDER_SITE_OTHER): Payer: 59

## 2021-07-22 DIAGNOSIS — E785 Hyperlipidemia, unspecified: Secondary | ICD-10-CM

## 2021-07-22 DIAGNOSIS — E669 Obesity, unspecified: Secondary | ICD-10-CM

## 2021-07-22 DIAGNOSIS — E1169 Type 2 diabetes mellitus with other specified complication: Secondary | ICD-10-CM

## 2021-07-22 LAB — COMPREHENSIVE METABOLIC PANEL
ALT: 18 U/L (ref 0–53)
AST: 15 U/L (ref 0–37)
Albumin: 4.3 g/dL (ref 3.5–5.2)
Alkaline Phosphatase: 56 U/L (ref 39–117)
BUN: 25 mg/dL — ABNORMAL HIGH (ref 6–23)
CO2: 30 mEq/L (ref 19–32)
Calcium: 9.2 mg/dL (ref 8.4–10.5)
Chloride: 98 mEq/L (ref 96–112)
Creatinine, Ser: 0.94 mg/dL (ref 0.40–1.50)
GFR: 92.5 mL/min (ref >60.00)
Glucose, Bld: 216 mg/dL — ABNORMAL HIGH (ref 70–99)
Potassium: 3.7 mEq/L (ref 3.5–5.1)
Sodium: 133 mEq/L — ABNORMAL LOW (ref 135–145)
Total Bilirubin: 0.3 mg/dL (ref 0.2–1.2)
Total Protein: 7.7 g/dL (ref 6.0–8.3)

## 2021-07-22 LAB — LDL CHOLESTEROL, DIRECT: Direct LDL: 96 mg/dL

## 2021-07-22 LAB — HEMOGLOBIN A1C: Hgb A1c MFr Bld: 7.4 % — ABNORMAL HIGH (ref 4.6–6.5)

## 2021-07-22 LAB — LIPID PANEL
Cholesterol: 193 mg/dL (ref 0–200)
HDL: 64 mg/dL (ref >39.00)
Total CHOL/HDL Ratio: 3
Triglycerides: 408 mg/dL — ABNORMAL HIGH (ref 0.0–149.0)

## 2021-07-29 ENCOUNTER — Other Ambulatory Visit: Payer: Self-pay

## 2021-07-29 ENCOUNTER — Encounter: Payer: Self-pay | Admitting: Endocrinology

## 2021-07-29 ENCOUNTER — Ambulatory Visit: Payer: 59 | Admitting: Endocrinology

## 2021-07-29 VITALS — BP 126/74 | HR 94 | Ht 66.0 in | Wt 210.0 lb

## 2021-07-29 DIAGNOSIS — E785 Hyperlipidemia, unspecified: Secondary | ICD-10-CM | POA: Diagnosis not present

## 2021-07-29 DIAGNOSIS — E1169 Type 2 diabetes mellitus with other specified complication: Secondary | ICD-10-CM

## 2021-07-29 DIAGNOSIS — E1165 Type 2 diabetes mellitus with hyperglycemia: Secondary | ICD-10-CM | POA: Diagnosis not present

## 2021-07-29 MED ORDER — OZEMPIC (1 MG/DOSE) 2 MG/1.5ML ~~LOC~~ SOPN: 1.0000 mg | PEN_INJECTOR | SUBCUTANEOUS | 2 refills | Status: DC

## 2021-07-29 NOTE — Progress Notes (Signed)
Patient ID: Brett Walsh, male   DOB: 04-07-1966, 56 y.o.   MRN: [id number]           Reason for Appointment: Follow-up for Type 2 Diabetes   History of Present Illness:          Date of diagnosis of type 2 diabetes mellitus: 2012       Background history:   At the time of diagnosis he weighed about 268 pounds His blood sugars were markedly increased at that time when he was hospitalized for treatment and sent home on insulin, metformin and Avandia He says that however he was able to lose weight with improved diet and was able to get off medications and insulin  He has been on various treatment since a couple of years later but detailed records are not available, moved from [place] His A1c has been as high as 14.5 in 2017 when he was prescribed Basaglar and Humalog although he does not remember this Subsequently his blood sugar control has been quite variable He was first started on metformin in 06/2016 and the dose was progressively increased  Recent history:   Non-insulin hypoglycemic drugs:   Invokana 300 mg daily, Prandin 1 mg before breakfast, Metformin none,  Ozempic 0.5 mg weekly  A1c is at 7.4, previously 6.8  He has not been seen in follow-up since 7/22  Current history of diabetes, recent management, blood sugar patterns and problems identified: He is not able to use the freestyle libre for the last 4 months because he misplaced his reader and also has does not have any strips for the regular meter  He did not call for refill  Also several weeks ago he stopped taking metformin because he thought it was making him feel bad and also constipated  He has not been watching his diet and eating foods like donuts  Lab glucose after breakfast was 216  Although his job is fairly physical he is not doing much exercise  His weight has fluctuated on the last year He thinks he has been taking his Ozempic regularly has also Invokana in the morning        Side effects  from medications have been: Diarrhea from high-dose metformin, constipation from Metformin ER   Previous sensor data:  CGM use % of time  92  2-week average/GV  137/19  Time in range      93%  % Time Above 180  7  % Time above 250   % Time Below 70      PRE-MEAL Fasting Lunch Dinner Bedtime Overall  Glucose range:       Averages:  135  119  122   137   POST-MEAL PC Breakfast PC Lunch PC Dinner  Glucose range:     Averages:  140  152  157     Self-care: The diet that the patient has been following is: tries to limit sugar, may occasionally have fried food.      Usually eating breakfast at 5 AM and dinner at 7-8 PM                Dietician visit, most recent: Never  Weight history:  Wt Readings from Last 3 Encounters:  07/29/21 210 lb (95.3 kg)  04/22/21 200 lb (90.7 kg)  12/13/20 215 lb (97.5 kg)    Glycemic control:   Lab Results  Component Value Date   HGBA1C 7.4 (H) 07/22/2021   HGBA1C 7.5 (H) 04/10/2021   HGBA1C 6.8 (  H) 12/06/2020   Lab Results  Component Value Date   MICROALBUR <0.7 04/10/2021   LDLCALC 57 12/06/2020   CREATININE 0.94 07/22/2021   Lab Results  Component Value Date   MICRALBCREAT 3.6 04/10/2021    Lab Results  Component Value Date   FRUCTOSAMINE 263 02/02/2020   FRUCTOSAMINE 436 (H) 09/28/2017      Allergies as of 07/29/2021       Reactions   Metformin And Related    GI upset   Protonix [pantoprazole Sodium] Hives        Medication List        Accurate as of July 29, 2021  4:11 PM. If you have any questions, ask your nurse or doctor.          STOP taking these medications    metFORMIN 500 MG 24 hr tablet Commonly known as: GLUCOPHAGE-XR Stopped by: Elayne Snare, MD       TAKE these medications    fluticasone 50 MCG/ACT nasal spray Commonly known as: FLONASE Place 2 sprays into both nostrils daily.   FreeStyle Libre 2 Sensor Misc 2 Devices by Does not apply route every 14 (fourteen) days.    FreeStyle Precision Neo Test test strip Generic drug: glucose blood TEST BLOOS SUGARS TWICE DAILY   Invokana 300 MG Tabs tablet Generic drug: canagliflozin TAKE 1 TABLET(300 MG) BY MOUTH DAILY BEFORE BREAKFAST   loratadine 10 MG tablet Commonly known as: CLARITIN Take 1 tablet (10 mg total) by mouth daily as needed for allergies.   ondansetron 4 MG disintegrating tablet Commonly known as: Zofran ODT Take 1 tablet (4 mg total) by mouth every 8 (eight) hours as needed for nausea or vomiting.   ONE TOUCH SURESOFT Misc Use 1 lancet per test. Test blood sugars 1-4 times per day as instructed.   ONE TOUCH ULTRA MINI w/Device Kit Use meter to check blood sugars 1-4 times daily as instructed.   Ozempic (0.25 or 0.5 MG/DOSE) 2 MG/1.5ML Sopn Generic drug: Semaglutide(0.25 or 0.5MG/DOS) ADMINISTER 0.5 MG UNDER THE SKIN ONCE A WEEK   promethazine-dextromethorphan 6.25-15 MG/5ML syrup Commonly known as: PROMETHAZINE-DM Take 5 mLs by mouth 4 (four) times daily as needed for cough.   repaglinide 1 MG tablet Commonly known as: PRANDIN TAKE 1 TABLET(1 MG) BY MOUTH DAILY BEFORE SUPPER   rosuvastatin 5 MG tablet Commonly known as: CRESTOR TAKE 1 TABLET(5 MG) BY MOUTH DAILY   Triumeq 600-50-300 MG tablet Generic drug: abacavir-dolutegravir-lamiVUDine TAKE 1 TABLET BY MOUTH DAILY        Allergies:  Allergies  Allergen Reactions   Metformin And Related     GI upset   Protonix [Pantoprazole Sodium] Hives    Past Medical History:  Diagnosis Date   Cataract    Deviated septum    Diabetes mellitus without complication ([place])    Gallstones 10/2018   HIV disease ([place])     Past Surgical History:  Procedure Laterality Date   NASAL SEPTUM SURGERY  1981, [id number]    Family History  Problem Relation Age of Onset   Diabetes Brother    Diabetes Mother    Diabetes Father    Heart failure Father    Diabetes Sister    Colon cancer Neg Hx    Esophageal cancer Neg Hx    Rectal  cancer Neg Hx    Stomach cancer Neg Hx     Social History:  reports that he has been smoking cigarettes and e-cigarettes. He has quit using smokeless tobacco.  He reports current alcohol use of about 1.0 standard drink per week. He reports that he does not use drugs.   Review of Systems  Lipid history: Has been on statin drugs with Crestor 5 mg daily since 2019 LDL is well controlled as follows Appears to have increase in triglycerides now below normal HDL  Lab Results  Component Value Date   CHOL 193 07/22/2021   CHOL 129 12/06/2020   CHOL 147 11/20/2020   Lab Results  Component Value Date   HDL 64.00 07/22/2021   HDL 40.40 12/06/2020   HDL 50 11/20/2020   Lab Results  Component Value Date   LDLCALC 57 12/06/2020   LDLCALC 72 11/20/2020   LDLCALC 45 02/16/2020   Lab Results  Component Value Date   TRIG (H) 07/22/2021    408.0 Triglyceride is over 400; calculations on Lipids are invalid.   TRIG 157.0 (H) 12/06/2020   TRIG 175 (H) 11/20/2020   Lab Results  Component Value Date   CHOLHDL 3 07/22/2021   CHOLHDL 3 12/06/2020   CHOLHDL 2.9 11/20/2020   Lab Results  Component Value Date   LDLDIRECT 96.0 07/22/2021   LDLDIRECT 101.0 06/15/2018           He has history of fatty liver on his imaging studies  Lab Results  Component Value Date   ALT 18 07/22/2021    No history of hypertension:  BP Readings from Last 3 Encounters:  07/29/21 126/74  05/22/21 119/78  04/22/21 136/68     Most recent foot exam: 11/21   LABS:  No visits with results within 1 Week(s) from this visit.  Latest known visit with results is:  Appointment on 07/22/2021  Component Date Value Ref Range Status   Cholesterol 07/22/2021 193  0 - 200 mg/dL Final   ATP III Classification       Desirable:  < 200 mg/dL               Borderline High:  200 - 239 mg/dL          High:  > = 240 mg/dL   Triglycerides 07/22/2021 408.0 Triglyceride is over 400; calculations on Lipids are invalid.  (H)  0.0 - 149.0 mg/dL Final   Normal:  <150 mg/dLBorderline High:  150 - 199 mg/dL   HDL 07/22/2021 64.00  >39.00 mg/dL Final   Total CHOL/HDL Ratio 07/22/2021 3   Final                  Men          Women1/2 Average Risk     3.4          3.3Average Risk          5.0          4.42X Average Risk          9.6          7.13X Average Risk          15.0          11.0                       Hgb A1c MFr Bld 07/22/2021 7.4 (H)  4.6 - 6.5 % Final   Glycemic Control Guidelines for People with Diabetes:Non Diabetic:  <6%Goal of Therapy: <7%Additional Action Suggested:  >8%    Sodium 07/22/2021 133 (L)  135 - 145 mEq/L Final   Potassium 07/22/2021 3.7  3.5 -  5.1 mEq/L Final   Chloride 07/22/2021 98  96 - 112 mEq/L Final   CO2 07/22/2021 30  19 - 32 mEq/L Final   Glucose, Bld 07/22/2021 216 (H)  70 - 99 mg/dL Final   BUN 07/22/2021 25 (H)  6 - 23 mg/dL Final   Creatinine, Ser 07/22/2021 0.94  0.40 - 1.50 mg/dL Final   Total Bilirubin 07/22/2021 0.3  0.2 - 1.2 mg/dL Final   Alkaline Phosphatase 07/22/2021 56  39 - 117 U/L Final   AST 07/22/2021 15  0 - 37 U/L Final   ALT 07/22/2021 18  0 - 53 U/L Final   Total Protein 07/22/2021 7.7  6.0 - 8.3 g/dL Final   Albumin 07/22/2021 4.3  3.5 - 5.2 g/dL Final   GFR 07/22/2021 92.50  >60.00 mL/min Final   Calculated using the CKD-EPI Creatinine Equation (2021)   Calcium 07/22/2021 9.2  8.4 - 10.5 mg/dL Final   Direct LDL 07/22/2021 96.0  mg/dL Final   Optimal:  <100 mg/dLNear or Above Optimal:  100-129 mg/dLBorderline High:  130-159 mg/dLHigh:  160-189 mg/dLVery High:  >190 mg/dL    Physical Examination:  BP 126/74    Pulse 94    Ht _0  (1.676 m)    Wt 210 lb (95.3 kg)    SpO2 97%    BMI 33.89 kg/m           ASSESSMENT:  Diabetes type 2, with obesity  See history of present illness for detailed discussion of current diabetes management, blood sugar patterns and problems identified  His A1c is 6.8 and now stable Is able to continue avoiding  insulin  Current management is with Metformin ER, Prandin 1 mg once a day, and Ozempic 0.5 weekly  Blood sugar monitoring is somewhat inadequate because of sensor issues Although may have some higher postprandial readings this is not consistent and depends on his carbohydrate intake  Weight has improved from better diet overall  LIPIDS: Well-controlled but triglycerides are high from poor diet, nonfasting labs and also because he was not fasting  Will need to recheck on the next visit  PLAN:    He will increase Ozempic to 1 mg and use up to 0.5 mg device with 2 shots at the same time  Leave off metformin for now  He will start using his smart phone with his [name]  When he finishes the current sensors we will call for the [place] 3 sensor  He will start walking or doing other exercise at least on his days off Discussed blood sugar targets both fasting and after meals May also need Prandin at dinnertime if his blood sugars after dinner are consistently over 160  More regular follow-up Consultation with nutritionist for meal planning and general information  Patient Instructions  Check blood sugars on waking up 3 days a week  Also check blood sugars about 2 hours after meals and do this after different meals by rotation  Recommended blood sugar levels on waking up are 90-130 and about 2 hours after meal is 130-160  Please bring your blood sugar monitor to each visit, thank you  Take 2 shots of 0.5 Ozempic weekly        Elayne Snare 07/29/2021, 4:11 PM   Note: This office note was prepared with Dragon voice recognition system technology. Any transcriptional errors that result from this process are unintentional.

## 2021-07-29 NOTE — Patient Instructions (Addendum)
Check blood sugars on waking up 3 days a week  Also check blood sugars about 2 hours after meals and do this after different meals by rotation  Recommended blood sugar levels on waking up are 90-130 and about 2 hours after meal is 130-160  Please bring your blood sugar monitor to each visit, thank you  Take 2 shots of 0.5 Ozempic weekly

## 2021-08-14 ENCOUNTER — Telehealth: Payer: 59 | Admitting: Nurse Practitioner

## 2021-08-14 DIAGNOSIS — M791 Myalgia, unspecified site: Secondary | ICD-10-CM

## 2021-08-14 NOTE — Progress Notes (Signed)
E visit for Flu like symptoms ?  ?We are sorry that you are not feeling well.  Here is how we plan to help! ?Based on what you have shared with me it looks like you may have flu-like symptoms that should be watched but do not seem to indicate anti-viral treatment.since you tested positive for the flu a couple of weeks ago, this is unlikely the flu and treatment should not be needed ? ?- I have left a work note in your chart. ? ?Influenza or ?the flu? is   an infection caused by a respiratory virus. The flu virus is highly contagious and persons who did not receive their yearly flu vaccination may ?catch? the flu from close contact. ? ?We have anti-viral medications to treat the viruses that cause this infection. They are not a ?cure? and only shorten the course of the infection. These prescriptions are most effective when they are given within the first 2 days of ?flu? symptoms. Antiviral medication are indicated if you have a high risk of complications from the flu. You should  also consider an antiviral medication if you are in close contact with someone who is at risk. These medications can help patients avoid complications from the flu  but have side effects that you should know. Possible side effects from Tamiflu or oseltamivir include nausea, vomiting, diarrhea, dizziness, headaches, eye redness, sleep problems or other respiratory symptoms. ?You should not take Tamiflu if you have an allergy to oseltamivir or any to the ingredients in Tamiflu. ? ?Based upon your symptoms and potential risk factors I recommend that you follow the flu symptoms recommendation that I have listed below. ? ?ANYONE WHO HAS FLU SYMPTOMS SHOULD: ?Stay home. The flu is highly contagious and going out or to work exposes others! ?Be sure to drink plenty of fluids. Water is fine as well as fruit juices, sodas and electrolyte beverages. You may want to stay away from caffeine or alcohol. If you are nauseated, try taking small sips of  liquids. How do you know if you are getting enough fluid? Your urine should be a pale yellow or almost colorless. ?Get rest. ?Taking a steamy shower or using a humidifier may help nasal congestion and ease sore throat pain. Using a saline nasal spray works much the same way. ?Cough drops, hard candies and sore throat lozenges may ease your cough. ?Line up a caregiver. Have someone check on you regularly. ? ? ?GET HELP RIGHT AWAY IF: ?You cannot keep down liquids or your medications. ?You become short of breath ?Your fell like you are going to pass out or loose consciousness. ?Your symptoms persist after you have completed your treatment plan ?MAKE SURE YOU  ?Understand these instructions. ?Will watch your condition. ?Will get help right away if you are not doing well or get worse. ? ?Your e-visit answers were reviewed by a board certified advanced clinical practitioner to complete your personal care plan.  Depending on the condition, your plan could have included both over the counter or prescription medications. ? ?If there is a problem please reply  once you have received a response from your provider. ? ?Your safety is important to [place].  If you have drug allergies check your prescription carefully.   ? ?You can use MyChart to ask questions about today?s visit, request a non-urgent call back, or ask for a work or school excuse for 24 hours related to this e-Visit. If it has been greater than 24 hours you will need to  follow up with your provider, or enter a new e-Visit to address those concerns. ? ?You will get an e-mail in the next two days asking about your experience.  I hope that your e-visit has been valuable and will speed your recovery. Thank you for using e-visits. ? ?5-10 minutes spent reviewing and documenting in chart. ? ? ?

## 2021-08-23 ENCOUNTER — Other Ambulatory Visit: Payer: Self-pay

## 2021-08-23 ENCOUNTER — Other Ambulatory Visit: Payer: Self-pay | Admitting: Nurse Practitioner

## 2021-08-23 DIAGNOSIS — S86811A Strain of other muscle(s) and tendon(s) at lower leg level, right leg, initial encounter: Secondary | ICD-10-CM

## 2021-08-23 DIAGNOSIS — E1165 Type 2 diabetes mellitus with hyperglycemia: Secondary | ICD-10-CM

## 2021-08-23 MED ORDER — REPAGLINIDE 1 MG PO TABS: ORAL_TABLET | ORAL | 3 refills | Status: DC

## 2021-09-05 ENCOUNTER — Other Ambulatory Visit: Payer: Self-pay

## 2021-09-05 ENCOUNTER — Other Ambulatory Visit: Payer: 59

## 2021-09-05 ENCOUNTER — Other Ambulatory Visit (HOSPITAL_COMMUNITY)
Admission: RE | Admit: 2021-09-05 | Discharge: 2021-09-05 | Disposition: A | Discharge status: Home / Self Care | Payer: 59 | Source: Ambulatory Visit | Attending: Infectious Diseases | Admitting: Infectious Diseases

## 2021-09-05 DIAGNOSIS — Z113 Encounter for screening for infections with a predominantly sexual mode of transmission: Secondary | ICD-10-CM

## 2021-09-05 DIAGNOSIS — B20 Human immunodeficiency virus [HIV] disease: Secondary | ICD-10-CM

## 2021-09-05 DIAGNOSIS — E785 Hyperlipidemia, unspecified: Secondary | ICD-10-CM

## 2021-09-06 ENCOUNTER — Other Ambulatory Visit: Payer: 59

## 2021-09-06 LAB — URINE CYTOLOGY ANCILLARY ONLY
Chlamydia: NEGATIVE
Comment: NEGATIVE
Comment: NORMAL
Neisseria Gonorrhea: NEGATIVE

## 2021-09-06 LAB — T-HELPER CELL (CD4) - (RCID CLINIC ONLY)
CD4 % Helper T Cell: 16 % — ABNORMAL LOW (ref 33–65)
CD4 T Cell Abs: 511 /uL (ref 400–1790)

## 2021-09-07 LAB — LIPID PANEL
Cholesterol: 103 mg/dL (ref <200)
HDL: 42 mg/dL (ref >=40)
LDL Cholesterol (Calc): 44 mg/dL (calc)
Non-HDL Cholesterol (Calc): 61 mg/dL (calc) (ref <130)
Total CHOL/HDL Ratio: 2.5 (calc) (ref <5.0)
Triglycerides: 88 mg/dL (ref <150)

## 2021-09-07 LAB — CBC
HCT: 38.4 % — ABNORMAL LOW (ref 38.5–50.0)
Hemoglobin: 13.1 g/dL — ABNORMAL LOW (ref 13.2–17.1)
MCH: 33.2 pg — ABNORMAL HIGH (ref 27.0–33.0)
MCHC: 34.1 g/dL (ref 32.0–36.0)
MCV: 97.2 fL (ref 80.0–100.0)
MPV: 10.7 fL (ref 7.5–12.5)
Platelets: 280 10*3/uL (ref 140–400)
RBC: 3.95 10*6/uL — ABNORMAL LOW (ref 4.20–5.80)
RDW: 13.4 % (ref 11.0–15.0)
WBC: 11.5 10*3/uL — ABNORMAL HIGH (ref 3.8–10.8)

## 2021-09-07 LAB — COMPREHENSIVE METABOLIC PANEL
AG Ratio: 1.6 (calc) (ref 1.0–2.5)
ALT: 14 U/L (ref 9–46)
AST: 14 U/L (ref 10–35)
Albumin: 4.4 g/dL (ref 3.6–5.1)
Alkaline phosphatase (APISO): 46 U/L (ref 35–144)
BUN: 25 mg/dL (ref 7–25)
CO2: 22 mmol/L (ref 20–32)
Calcium: 9.1 mg/dL (ref 8.6–10.3)
Chloride: 106 mmol/L (ref 98–110)
Creat: 0.9 mg/dL (ref 0.70–1.30)
Globulin: 2.7 g/dL (calc) (ref 1.9–3.7)
Glucose, Bld: 100 mg/dL — ABNORMAL HIGH (ref 65–99)
Potassium: 4 mmol/L (ref 3.5–5.3)
Sodium: 138 mmol/L (ref 135–146)
Total Bilirubin: 0.3 mg/dL (ref 0.2–1.2)
Total Protein: 7.1 g/dL (ref 6.1–8.1)

## 2021-09-07 LAB — RPR: RPR Ser Ql: NONREACTIVE

## 2021-09-07 LAB — HIV-1 RNA QUANT-NO REFLEX-BLD
HIV 1 RNA Quant: NOT DETECTED copies/mL
HIV-1 RNA Quant, Log: NOT DETECTED Log copies/mL

## 2021-09-19 ENCOUNTER — Other Ambulatory Visit: Payer: Self-pay

## 2021-09-19 ENCOUNTER — Ambulatory Visit: Payer: 59 | Admitting: Infectious Diseases

## 2021-09-19 ENCOUNTER — Encounter: Payer: Self-pay | Admitting: Infectious Diseases

## 2021-09-19 DIAGNOSIS — B20 Human immunodeficiency virus [HIV] disease: Secondary | ICD-10-CM

## 2021-09-19 DIAGNOSIS — N1831 Chronic kidney disease, stage 3a: Secondary | ICD-10-CM | POA: Diagnosis not present

## 2021-09-19 NOTE — Progress Notes (Signed)
? ?  Subjective:  ? ? Patient ID: Brett Walsh, male  DOB: July 03, 1965, 56 y.o.        MRN: [id number] ? ? 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 ?He was seen in f/u in ID clinic on 06-20-14 and started on triumeq. ?Has been doing well. FSG have been "very under controlled". A1C 7.4% (07-2021). Blames this on "holidays".  ?He has continuous Glc monitor. Was told by retinal specialist to come back in 5 yrs. Routine eye exam told him he has cataracts. No visual problems. Has numbness in hands when he sleeps on his side, none in feet.  ?  ?Has been taking his ART without issue. ?Doing well, excited his CD4 has come up so much.  ?Has lost 41#, more active job.  ? ?HIV 1 RNA Quant  ?Date Value  ?09/05/2021 NOT DETECTED copies/mL  ?11/20/2020 Not Detected Copies/mL  ?02/16/2020 <20 Copies/mL  ? ?CD4 T Cell Abs (/uL)  ?Date Value  ?09/05/2021 511  ?11/20/2020 396 (L)  ?02/16/2020 389 (L)  ? ? ? ?Health Maintenance  ?Topic Date Due  ?? Zoster Vaccines- Shingrix (1 of 2) Never done  ?? COLONOSCOPY (Pts 45-29yrs Insurance coverage will need to be confirmed)  Never done  ?? OPHTHALMOLOGY EXAM  02/17/2018  ?? COVID-19 Vaccine (4 - Booster for Pfizer series) 05/11/2020  ?? FOOT EXAM  04/19/2021  ?? INFLUENZA VACCINE  12/31/2021  ?? HEMOGLOBIN A1C  01/19/2022  ?? URINE MICROALBUMIN  04/10/2022  ?? TETANUS/TDAP  10/01/2023  ?? Hepatitis C Screening  Completed  ?? HIV Screening  Completed  ?? HPV VACCINES  Aged Out  ? ? ? ? ?Review of Systems  ?Constitutional:  Negative for chills, fever and weight loss.  ?Eyes:  Negative for blurred vision.  ?Respiratory:  Negative for cough and shortness of breath.   ?Gastrointestinal:  Negative for blood in stool, constipation and diarrhea.  ?Genitourinary:  Negative for dysuria.  ?Neurological:  Negative for sensory change.  ?Psychiatric/Behavioral:  The  patient does not have insomnia.   ? ?Please see HPI. All other systems reviewed and negative. ? ?   ?Objective:  ?Physical Exam ?Vitals reviewed.  ?Constitutional:   ?   Appearance: Normal appearance.  ?HENT:  ?   Mouth/Throat:  ?   Mouth: Mucous membranes are moist.  ?   Pharynx: No oropharyngeal exudate.  ?Eyes:  ?   Extraocular Movements: Extraocular movements intact.  ?   Pupils: Pupils are equal, round, and reactive to light.  ?Cardiovascular:  ?   Rate and Rhythm: Normal rate and regular rhythm.  ?Pulmonary:  ?   Effort: Pulmonary effort is normal.  ?   Breath sounds: Normal breath sounds.  ?Abdominal:  ?   General: Bowel sounds are normal. There is no distension.  ?   Palpations: Abdomen is soft.  ?Musculoskeletal:     ?   General: Normal range of motion.  ?   Cervical back: Normal range of motion and neck supple.  ?   Right lower leg: No edema.  ?   Left lower leg: No edema.  ?Neurological:  ?   General: No focal deficit present.  ?   Mental Status: He is alert.  ? ? ? ? ? ?   ?Assessment & Plan:  ? ?

## 2021-09-19 NOTE — Assessment & Plan Note (Signed)
He is doing well ?Has had eye f/u ?His A1C has increased and he is aware ?He has had sig wt loss.  ?

## 2021-09-19 NOTE — Assessment & Plan Note (Addendum)
He is doing well ?Some concern about triumeq and long term side effects, esp given he has DM. ?We reviewed his labs  ?His CD4 has improved markedly from his dx.  ?He is moving to [place] and will help him find a provider there.  ? ?

## 2021-09-19 NOTE — Assessment & Plan Note (Signed)
Wt improving.  ?Will continue to encourage and watch.  ?

## 2021-09-19 NOTE — Assessment & Plan Note (Signed)
Last Cr normal ?

## 2021-09-23 ENCOUNTER — Other Ambulatory Visit: Payer: Self-pay

## 2021-09-23 MED ORDER — CANAGLIFLOZIN 300 MG PO TABS
ORAL_TABLET | ORAL | 0 refills | Status: DC
Start: 2021-09-23 — End: 2021-12-30

## 2021-10-02 ENCOUNTER — Telehealth: Payer: 59 | Admitting: Physician Assistant

## 2021-10-02 DIAGNOSIS — R112 Nausea with vomiting, unspecified: Secondary | ICD-10-CM

## 2021-10-02 DIAGNOSIS — R197 Diarrhea, unspecified: Secondary | ICD-10-CM

## 2021-10-02 DIAGNOSIS — R531 Weakness: Secondary | ICD-10-CM

## 2021-10-02 NOTE — Progress Notes (Signed)
Based on what you shared with me, I feel your condition warrants further evaluation and I recommend that you be seen in a face to face visit. ? ?Giving recurrence of more significant symptoms and notation of weakness, especially considering your other chronic health issues, I recommend you be evaluated in person for further assessment and ongoing management.  ?  ?NOTE: There will be NO CHARGE for this eVisit ?  ?If you are having a true medical emergency please call 911.   ?  ? For an urgent face to face visit, [facility] has six urgent care centers for your convenience:  ?  ? [facility] at [facility] ?Get Driving Directions ?[phone] ?[phone] ?[place], [place] [address] ?  ? [facility]) ?Get Driving Directions ?[phone] ?[address] ?[place], [place] [address] ? ?[facility]) ?Get Driving Directions ?[phone] ?[address] ?[place],  [place]  [address] ? ?[facility] ?Get Driving Directions ?[phone] ?[address] ?[place], [place] [address] ?  ?[facility] [place] ?Get Driving Directions  ?[phone] ?[address] ?[address] ?[place], [place] [address] ?  ?[facility] ?Get Driving Directions ?[phone] ?[address] ?[place], [place] [address] ? ?Your MyChart E-visit questionnaire answers were reviewed by a board certified advanced clinical practitioner to complete your personal care plan based on your specific symptoms.  Thank you for using e-Visits. ?  ? ?

## 2021-10-04 ENCOUNTER — Ambulatory Visit (HOSPITAL_COMMUNITY)
Admission: EM | Admit: 2021-10-04 | Discharge: 2021-10-04 | Disposition: A | Discharge status: Home / Self Care | Payer: 59 | Attending: Physician Assistant | Admitting: Physician Assistant

## 2021-10-04 ENCOUNTER — Encounter (HOSPITAL_COMMUNITY): Payer: Self-pay | Admitting: Emergency Medicine

## 2021-10-04 DIAGNOSIS — R112 Nausea with vomiting, unspecified: Secondary | ICD-10-CM

## 2021-10-04 DIAGNOSIS — Z20822 Contact with and (suspected) exposure to covid-19: Secondary | ICD-10-CM | POA: Diagnosis not present

## 2021-10-04 DIAGNOSIS — R197 Diarrhea, unspecified: Secondary | ICD-10-CM | POA: Diagnosis not present

## 2021-10-04 DIAGNOSIS — R0981 Nasal congestion: Secondary | ICD-10-CM

## 2021-10-04 DIAGNOSIS — B20 Human immunodeficiency virus [HIV] disease: Secondary | ICD-10-CM | POA: Diagnosis not present

## 2021-10-04 DIAGNOSIS — R059 Cough, unspecified: Secondary | ICD-10-CM | POA: Diagnosis not present

## 2021-10-04 DIAGNOSIS — R509 Fever, unspecified: Secondary | ICD-10-CM | POA: Diagnosis present

## 2021-10-04 DIAGNOSIS — R109 Unspecified abdominal pain: Secondary | ICD-10-CM

## 2021-10-04 LAB — CBC WITH DIFFERENTIAL/PLATELET
Abs Immature Granulocytes: 0.04 10*3/uL (ref 0.00–0.07)
Basophils Absolute: 0 10*3/uL (ref 0.0–0.1)
Basophils Relative: 0 %
Eosinophils Absolute: 0.3 10*3/uL (ref 0.0–0.5)
Eosinophils Relative: 3 %
HCT: 41.9 % (ref 39.0–52.0)
Hemoglobin: 14.9 g/dL (ref 13.0–17.0)
Immature Granulocytes: 0 %
Lymphocytes Relative: 26 %
Lymphs Abs: 3.2 10*3/uL (ref 0.7–4.0)
MCH: 34.5 pg — ABNORMAL HIGH (ref 26.0–34.0)
MCHC: 35.6 g/dL (ref 30.0–36.0)
MCV: 97 fL (ref 80.0–100.0)
Monocytes Absolute: 1 10*3/uL (ref 0.1–1.0)
Monocytes Relative: 8 %
Neutro Abs: 7.6 10*3/uL (ref 1.7–7.7)
Neutrophils Relative %: 63 %
Platelets: 295 10*3/uL (ref 150–400)
RBC: 4.32 MIL/uL (ref 4.22–5.81)
RDW: 13.1 % (ref 11.5–15.5)
WBC: 12.1 10*3/uL — ABNORMAL HIGH (ref 4.0–10.5)
nRBC: 0 % (ref 0.0–0.2)

## 2021-10-04 LAB — POCT URINALYSIS DIPSTICK, ED / UC
Bilirubin Urine: NEGATIVE
Glucose, UA: >=1000 mg/dL — AB
Hgb urine dipstick: NEGATIVE
Ketones, ur: NEGATIVE mg/dL
Leukocytes,Ua: NEGATIVE
Nitrite: NEGATIVE
Protein, ur: NEGATIVE mg/dL
Specific Gravity, Urine: 1.015 (ref 1.005–1.030)
Urobilinogen, UA: 1 mg/dL (ref 0.0–1.0)
pH: 6 (ref 5.0–8.0)

## 2021-10-04 LAB — COMPREHENSIVE METABOLIC PANEL
ALT: 25 U/L (ref 0–44)
AST: 19 U/L (ref 15–41)
Albumin: 4 g/dL (ref 3.5–5.0)
Alkaline Phosphatase: 56 U/L (ref 38–126)
Anion gap: 10 (ref 5–15)
BUN: 20 mg/dL (ref 6–20)
CO2: 23 mmol/L (ref 22–32)
Calcium: 9.2 mg/dL (ref 8.9–10.3)
Chloride: 109 mmol/L (ref 98–111)
Creatinine, Ser: 0.97 mg/dL (ref 0.61–1.24)
GFR, Estimated: >60 mL/min (ref >60)
Glucose, Bld: 98 mg/dL (ref 70–99)
Potassium: 4.3 mmol/L (ref 3.5–5.1)
Sodium: 142 mmol/L (ref 135–145)
Total Bilirubin: 0.6 mg/dL (ref 0.3–1.2)
Total Protein: 7.5 g/dL (ref 6.5–8.1)

## 2021-10-04 LAB — SARS CORONAVIRUS 2 (TAT 6-24 HRS): SARS Coronavirus 2: NEGATIVE

## 2021-10-04 LAB — POC INFLUENZA A AND B ANTIGEN (URGENT CARE ONLY)
INFLUENZA A ANTIGEN, POC: NEGATIVE
INFLUENZA B ANTIGEN, POC: NEGATIVE

## 2021-10-04 MED ORDER — ONDANSETRON 4 MG PO TBDP: 4.0000 mg | ORAL_TABLET | Freq: Three times a day (TID) | ORAL | 0 refills | Status: DC | PRN

## 2021-10-04 NOTE — ED Provider Notes (Signed)
?[place] ? ? ? ?CSN: [id number] ?Arrival date & time: 10/04/21  6314 ? ? ?  ? ?History   ?Chief Complaint ?Chief Complaint  ?Patient presents with  ? Fever  ? Nasal Congestion  ? Diarrhea  ? ? ?HPI ?Brett Walsh is a 56 y.o. male.  ? ?Patient presents today with a 5-day history of diarrhea.  He reports that diarrhea responds to Imodium but without medication use he is having 5+ bowel movements per day without blood or mucus.  He denies any significant abdominal pain but has had nausea and vomiting particularly following solid food intake.  He reports a few days after diarrhea began he developed severe congestion as well as a mild cough.  He has had a low-grade fever but denies any chest pain or shortness of breath.  He has been taking Claritin, DayQuil, NyQuil with minimal improvement of symptoms.  Denies any recent antibiotic use or medication changes.  He denies history of gastrointestinal disorder but did have GI upset with metformin; quit using this medication 2 months ago due to GI side effects which resolved following discontinuation of medication.  Denies any recent medication changes, suspicious food intake, known sick contacts, recent travel.  He denies previous abdominal surgery.  He has missed work as a result of symptoms. ? ? ?Past Medical History:  ?Diagnosis Date  ? Cataract   ? Deviated septum   ? Diabetes mellitus without complication ([place])   ? Gallstones 10/2018  ? HIV disease ([place])   ? ? ?Patient Active Problem List  ? Diagnosis Date Noted  ? Stage 3a chronic kidney disease ([place]) 04/22/2021  ? Mixed hyperlipidemia 06/11/2018  ? Morbid obesity ([place]) 02/02/2018  ? Hyperlipidemia associated with type 2 diabetes mellitus ([place]) 02/02/2018  ? GERD (gastroesophageal reflux disease) 12/02/2016  ? Depression (emotion) 07/23/2016  ? Insomnia 07/23/2016  ? TMJ (temporomandibular joint syndrome) 01/21/2016  ? Slow transit constipation 08/16/2014  ? HIV disease ([place]) 06/20/2014  ? Diabetes type  2, uncontrolled 06/20/2014  ? ? ?Past Surgical History:  ?Procedure Laterality Date  ? NASAL SEPTUM SURGERY  1981, [id number]  ? ? ? ? ? ?Home Medications   ? ?Prior to Admission medications   ?Medication Sig Start Date End Date Taking? Authorizing Provider  ?Blood Glucose Monitoring Suppl (ONE TOUCH ULTRA MINI) w/Device KIT Use meter to check blood sugars 1-4 times daily as instructed. 10/14/16   Golden Circle, FNP  ?canagliflozin (INVOKANA) 300 MG TABS tablet TAKE 1 TABLET(300 MG) BY MOUTH DAILY BEFORE BREAKFAST 09/23/21   Elayne Snare, MD  ?Continuous Blood Gluc Sensor (FREESTYLE LIBRE 2 SENSOR) MISC 2 Devices by Does not apply route every 14 (fourteen) days. 12/13/20   Elayne Snare, MD  ?fluticasone Asencion Islam) 50 MCG/ACT nasal spray Place 2 sprays into both nostrils daily. 03/20/21   Chevis Pretty, FNP  ?glucose blood (FREESTYLE PRECISION NEO TEST) test strip TEST BLOOS SUGARS TWICE DAILY 03/28/21   Elayne Snare, MD  ?Lancets Misc. (ONE TOUCH SURESOFT) MISC Use 1 lancet per test. Test blood sugars 1-4 times per day as instructed. 10/14/16   Golden Circle, FNP  ?loratadine (CLARITIN) 10 MG tablet Take 1 tablet (10 mg total) by mouth daily as needed for allergies. 08/03/18   Marrian Salvage, [place]  ?ondansetron (ZOFRAN ODT) 4 MG disintegrating tablet Take 1 tablet (4 mg total) by mouth every 8 (eight) hours as needed for nausea or vomiting. 10/04/21   Deloris Moger, [place] Sk[place], PA-C  ?promethazine-dextromethorphan (PROMETHAZINE-DM) 6.25-15 MG/5ML syrup  Take 5 mLs by mouth 4 (four) times daily as needed for cough. 05/22/21   Vanessa Kick, MD  ?repaglinide (PRANDIN) 1 MG tablet TAKE 1 TABLET(1 MG) BY MOUTH DAILY BEFORE SUPPER 08/23/21   Elayne Snare, MD  ?rosuvastatin (CRESTOR) 5 MG tablet TAKE 1 TABLET(5 MG) BY MOUTH DAILY 01/27/21   Elayne Snare, MD  ?Semaglutide, 1 MG/DOSE, (OZEMPIC, 1 MG/DOSE,) 2 MG/1.5ML SOPN Inject 1 mg into the skin once a week. 07/29/21   Elayne Snare, MD  ?Perlie Gold 600-50-300 MG tablet TAKE 1 TABLET  BY MOUTH DAILY 05/30/21   Campbell Riches, MD  ? ? ?Family History ?Family History  ?Problem Relation Age of Onset  ? Diabetes Brother   ? Diabetes Mother   ? Diabetes Father   ? Heart failure Father   ? Diabetes Sister   ? Colon cancer Neg Hx   ? Esophageal cancer Neg Hx   ? Rectal cancer Neg Hx   ? Stomach cancer Neg Hx   ? ? ?Social History ?Social History  ? ?Tobacco Use  ? Smoking status: Light Smoker  ?  Years: 36.00  ?  Types: Cigarettes, E-cigarettes  ? Smokeless tobacco: Former  ? Tobacco comments:  ?  3-4/day  ?Vaping Use  ? Vaping Use: Former  ?Substance Use Topics  ? Alcohol use: Yes  ?  Alcohol/week: 1.0 standard drink  ?  Types: 1 Standard drinks or equivalent per week  ?  Comment: socially  ? Drug use: No  ?  Types: Hydromorphone  ? ? ? ?Allergies   ?Metformin and related and Protonix [pantoprazole sodium] ? ? ?Review of Systems ?Review of Systems  ?Constitutional:  Positive for activity change, appetite change and fever. Negative for fatigue.  ?HENT:  Positive for congestion and sore throat. Negative for sinus pressure and sneezing.   ?Respiratory:  Positive for cough. Negative for shortness of breath.   ?Cardiovascular:  Negative for chest pain.  ?Gastrointestinal:  Positive for diarrhea, nausea and vomiting. Negative for abdominal pain and constipation.  ?Neurological:  Negative for dizziness, light-headedness and headaches.  ? ? ?Physical Exam ?Triage Vital Signs ?ED Triage Vitals  ?Enc Vitals Group  ?   BP 10/04/21 0925 122/78  ?   Pulse Rate 10/04/21 0925 90  ?   Resp 10/04/21 0925 18  ?   Temp 10/04/21 0925 98.3 ?F (36.8 ?C)  ?   Temp Source 10/04/21 0925 Oral  ?   SpO2 10/04/21 0925 98 %  ?   Weight 10/04/21 0924 202 lb (91.6 kg)  ?   Height --   ?   Head Circumference --   ?   Peak Flow --   ?   Pain Score 10/04/21 0923 0  ?   Pain Loc --   ?   Pain Edu? --   ?   Excl. in [place]? --   ? ?No data found. ? ?Updated Vital Signs ?BP 122/78 (BP Location: Right Arm)   Pulse 90   Temp 98.3 ?F (36.8  ?C) (Oral)   Resp 18   Wt 202 lb (91.6 kg)   SpO2 98%   BMI 32.60 kg/m?  ? ?Visual Acuity ?Right Eye Distance:   ?Left Eye Distance:   ?Bilateral Distance:   ? ?Right Eye Near:   ?Left Eye Near:    ?Bilateral Near:    ? ?Physical Exam ?Vitals reviewed.  ?Constitutional:   ?   General: He is awake.  ?   Appearance: Normal appearance. He  is well-developed. He is not ill-appearing.  ?   Comments: Very pleasant male appears stated age in no acute distress sitting comfortably in exam room  ?HENT:  ?   Head: Normocephalic and atraumatic.  ?   Right Ear: Tympanic membrane, ear canal and external ear normal. Tympanic membrane is not erythematous or bulging.  ?   Left Ear: Tympanic membrane, ear canal and external ear normal. Tympanic membrane is not erythematous or bulging.  ?   Nose: Nose normal.  ?   Mouth/Throat:  ?   Pharynx: Uvula midline. No oropharyngeal exudate or posterior oropharyngeal erythema.  ?Cardiovascular:  ?   Rate and Rhythm: Normal rate and regular rhythm.  ?   Heart sounds: Normal heart sounds, S1 normal and S2 normal. No murmur heard. ?Pulmonary:  ?   Effort: Pulmonary effort is normal. No accessory muscle usage or respiratory distress.  ?   Breath sounds: Normal breath sounds. No stridor. No wheezing, rhonchi or rales.  ?   Comments: Clear to auscultation bilaterally ?Abdominal:  ?   General: Bowel sounds are normal.  ?   Palpations: Abdomen is soft.  ?   Tenderness: There is generalized abdominal tenderness and tenderness in the left upper quadrant and left lower quadrant. There is no right CVA tenderness, left CVA tenderness, guarding or rebound.  ?   Comments: Generalized tenderness palpation throughout abdomen worse on left abdomen.  No acute abdomen on physical exam.  ?Neurological:  ?   Mental Status: He is alert.  ?Psychiatric:     ?   Behavior: Behavior is cooperative.  ? ? ? ?UC Treatments / Results  ?Labs ?(all labs ordered are listed, but only abnormal results are displayed) ?Labs  Reviewed  ?POCT URINALYSIS DIPSTICK, ED / UC - Abnormal; Notable for the following components:  ?    Result Value  ? Glucose, UA >=1000 (*)   ? All other components within normal limits  ?SARS CORONAVIRUS 2 (TA

## 2021-10-04 NOTE — Discharge Instructions (Signed)
I will contact you with your lab work once I receive these results.  I have called in Zofran to be used as needed for nausea symptoms.  I would recommend a primarily liquid diet for the next several days.  You should be reevaluated within a few days.  If you develop severe abdominal pain, fever, nausea/vomiting interfere with oral intake you need to go to the emergency room immediately for a CT scan as we discussed. ?

## 2021-10-04 NOTE — ED Triage Notes (Signed)
Pt reports flu like symptoms beginning Tuesday and diarrhea since Sunday. States he was taking OTC Clartin with no relief and now takes OTC dayquil and Nyquil.  ?

## 2021-10-07 ENCOUNTER — Encounter: Payer: Self-pay | Admitting: Emergency Medicine

## 2021-10-07 ENCOUNTER — Ambulatory Visit: Payer: 59 | Admitting: Emergency Medicine

## 2021-10-07 VITALS — BP 116/82 | HR 93 | Temp 98.3°F | Ht 66.0 in | Wt 206.1 lb

## 2021-10-07 DIAGNOSIS — E1169 Type 2 diabetes mellitus with other specified complication: Secondary | ICD-10-CM

## 2021-10-07 DIAGNOSIS — E785 Hyperlipidemia, unspecified: Secondary | ICD-10-CM

## 2021-10-07 DIAGNOSIS — B20 Human immunodeficiency virus [HIV] disease: Secondary | ICD-10-CM

## 2021-10-07 DIAGNOSIS — K529 Noninfective gastroenteritis and colitis, unspecified: Secondary | ICD-10-CM | POA: Diagnosis not present

## 2021-10-07 NOTE — Patient Instructions (Signed)
Food Choices to Help Relieve Diarrhea, Adult Diarrhea can make you feel weak and cause you to become dehydrated. It is important to choose the right foods and drinks to: Relieve diarrhea. Replace lost fluids and nutrients. Prevent dehydration. What are tips for following this plan? Relieving diarrhea Avoid foods that make your diarrhea worse. These may include: Foods and beverages sweetened with high-fructose corn syrup, honey, or sweeteners such as xylitol, sorbitol, and mannitol. Fried, greasy, or spicy foods. Raw fruits and vegetables. Eat foods that are rich in probiotics. These include foods such as yogurt and fermented milk products. Probiotics can help increase healthy bacteria in your stomach and intestines (gastrointestinal tract or GI tract). This may help digestion and stop diarrhea. If you have lactose intolerance, avoid dairy products. These may make your diarrhea worse. Take medicine to help stop diarrhea only as told by your health care provider. Replacing nutrients  Eat bland, easy-to-digest foods in small amounts as you are able, until your diarrhea starts to get better. These foods include bananas, applesauce, rice, toast, and crackers. Gradually reintroduce nutrient-rich foods as tolerated or as told by your health care provider. This includes: Well-cooked protein foods, such as eggs, lean meats like fish or chicken without skin, and tofu. Peeled, seeded, and soft-cooked fruits and vegetables. Low-fat dairy products. Whole grains. Take vitamin and mineral supplements as told by your health care provider. Preventing dehydration  Start by sipping water or a solution to prevent dehydration (oral rehydration solution, ORS). This is a drink that helps replace fluids and minerals your body has lost. You can buy an ORS at pharmacies and retail stores. Try to drink at least 8-10 cups (2,000-2,500 mL) of fluid each day to help replace lost fluids. If you have urine that is pale  yellow, you are getting enough fluids. You may drink other liquids in addition to water, such as fruit juice that you have added water to (diluted fruit juice) or low-calorie sports drinks, as tolerated or as told by your health care provider. Avoid drinks with caffeine, such as coffee, tea, or soft drinks. Avoid alcohol. Summary When you have diarrhea, it is important to choose the right foods and drinks to relieve diarrhea, to replace lost fluids and nutrients, and to prevent dehydration. Make sure you drink enough fluid to keep your urine pale yellow. You may benefit from eating bland foods at first. Gradually reintroduce healthy, nutrient-rich foods as tolerated or as told by your health care provider. Avoid foods that make your diarrhea worse, such as fried, greasy, or spicy foods. This information is not intended to replace advice given to you by your health care provider. Make sure you discuss any questions you have with your health care provider. Document Revised: 07/05/2019 Document Reviewed: 07/05/2019 Elsevier Patient Education  2023 Elsevier Inc.  

## 2021-10-07 NOTE — Progress Notes (Signed)
Brett Walsh ?56 y.o. ? ? ?Chief Complaint  ?Patient presents with  ? Follow-up  ?  F/u urgent care , if patient eats solid food has diarrhea. Only having liquids   ? ? ?HISTORY OF PRESENT ILLNESS: ?Acute problem visit today. ?This is a 56 y.o. male here for urgent care visit follow-up.  Seen for diarrhea and abdominal cramping on 10/04/2021. ?Symptoms started about 7 days ago and progressively getting better.  Was able to eat cereal with milk this morning without any difficulty.  No longer having fever or chills.  Denies abdominal pain.  Denies vomiting. ?No other complaints or medical concerns today. ? ? ?HPI ? ? ?Prior to Admission medications   ?Medication Sig Start Date End Date Taking? Authorizing Provider  ?Blood Glucose Monitoring Suppl (ONE TOUCH ULTRA MINI) w/Device KIT Use meter to check blood sugars 1-4 times daily as instructed. 10/14/16  Yes Golden Circle, FNP  ?canagliflozin (INVOKANA) 300 MG TABS tablet TAKE 1 TABLET(300 MG) BY MOUTH DAILY BEFORE BREAKFAST 09/23/21  Yes Elayne Snare, MD  ?Continuous Blood Gluc Sensor (FREESTYLE LIBRE 2 SENSOR) MISC 2 Devices by Does not apply route every 14 (fourteen) days. 12/13/20  Yes Elayne Snare, MD  ?fluticasone Asencion Islam) 50 MCG/ACT nasal spray Place 2 sprays into both nostrils daily. 03/20/21  Yes Chevis Pretty, FNP  ?glucose blood (FREESTYLE PRECISION NEO TEST) test strip TEST BLOOS SUGARS TWICE DAILY 03/28/21  Yes Elayne Snare, MD  ?Lancets Misc. (ONE TOUCH SURESOFT) MISC Use 1 lancet per test. Test blood sugars 1-4 times per day as instructed. 10/14/16  Yes Golden Circle, FNP  ?loratadine (CLARITIN) 10 MG tablet Take 1 tablet (10 mg total) by mouth daily as needed for allergies. 08/03/18  Yes Marrian Salvage, [place]  ?ondansetron (ZOFRAN ODT) 4 MG disintegrating tablet Take 1 tablet (4 mg total) by mouth every 8 (eight) hours as needed for nausea or vomiting. 10/04/21  Yes Raspet, Erin K, PA-C  ?repaglinide (PRANDIN) 1 MG tablet TAKE 1 TABLET(1  MG) BY MOUTH DAILY BEFORE SUPPER 08/23/21  Yes Elayne Snare, MD  ?rosuvastatin (CRESTOR) 5 MG tablet TAKE 1 TABLET(5 MG) BY MOUTH DAILY 01/27/21  Yes Elayne Snare, MD  ?Semaglutide, 1 MG/DOSE, (OZEMPIC, 1 MG/DOSE,) 2 MG/1.5ML SOPN Inject 1 mg into the skin once a week. 07/29/21  Yes Elayne Snare, MD  ?TRIUMEQ 867-61-950 MG tablet TAKE 1 TABLET BY MOUTH DAILY 05/30/21  Yes Campbell Riches, MD  ? ? ?Allergies  ?Allergen Reactions  ? Metformin And Related   ?  GI upset  ? Protonix [Pantoprazole Sodium] Hives  ? ? ?Patient Active Problem List  ? Diagnosis Date Noted  ? Stage 3a chronic kidney disease ([place]) 04/22/2021  ? Mixed hyperlipidemia 06/11/2018  ? Morbid obesity ([place]) 02/02/2018  ? Hyperlipidemia associated with type 2 diabetes mellitus ([place]) 02/02/2018  ? GERD (gastroesophageal reflux disease) 12/02/2016  ? Depression (emotion) 07/23/2016  ? Insomnia 07/23/2016  ? TMJ (temporomandibular joint syndrome) 01/21/2016  ? Slow transit constipation 08/16/2014  ? HIV disease ([place]) 06/20/2014  ? Diabetes type 2, uncontrolled 06/20/2014  ? ? ?Past Medical History:  ?Diagnosis Date  ? Cataract   ? Deviated septum   ? Diabetes mellitus without complication ([place])   ? Gallstones 10/2018  ? HIV disease ([place])   ? ? ?Past Surgical History:  ?Procedure Laterality Date  ? [place], [id number]  ? ? ?Social History  ? ?Socioeconomic History  ? Marital status: Single  ?  Spouse name:  Not on file  ? Number of children: 0  ? Years of education: 34  ? Highest education level: Not on file  ?Occupational History  ? Occupation: Financial risk analyst  ?Tobacco Use  ? Smoking status: Light Smoker  ?  Years: 36.00  ?  Types: Cigarettes, E-cigarettes  ? Smokeless tobacco: Former  ? Tobacco comments:  ?  3-4/day  ?Vaping Use  ? Vaping Use: Former  ?Substance and Sexual Activity  ? Alcohol use: Yes  ?  Alcohol/week: 1.0 standard drink  ?  Types: 1 Standard drinks or equivalent per week  ?  Comment: socially  ? Drug use: No  ?   Types: Hydromorphone  ? Sexual activity: Never  ?  Partners: Male  ?  Comment: pt. declined condoms  ?Other Topics Concern  ? Not on file  ?Social History Narrative  ? Fun: Watch TV, veg at home.   ? ?Social Determinants of Health  ? ?Financial Resource Strain: Not on file  ?Food Insecurity: Not on file  ?Transportation Needs: Not on file  ?Physical Activity: Not on file  ?Stress: Not on file  ?Social Connections: Not on file  ?Intimate Partner Violence: Not on file  ? ? ?Family History  ?Problem Relation Age of Onset  ? Diabetes Brother   ? Diabetes Mother   ? Diabetes Father   ? Heart failure Father   ? Diabetes Sister   ? Colon cancer Neg Hx   ? Esophageal cancer Neg Hx   ? Rectal cancer Neg Hx   ? Stomach cancer Neg Hx   ? ? ? ?Review of Systems  ?Constitutional: Negative.  Negative for chills and fever.  ?HENT: Negative.  Negative for congestion and sore throat.   ?Respiratory: Negative.  Negative for cough and shortness of breath.   ?Cardiovascular: Negative.  Negative for chest pain and palpitations.  ?Gastrointestinal: Negative.  Negative for abdominal pain, diarrhea, nausea and vomiting.  ?     GI symptoms resolved  ?Genitourinary: Negative.   ?Skin: Negative.  Negative for rash.  ?Neurological:  Negative for dizziness and headaches.  ?All other systems reviewed and are negative. ? ?Today's Vitals  ? 10/07/21 1020  ?BP: 116/82  ?Pulse: 93  ?Temp: 98.3 ?F (36.8 ?C)  ?TempSrc: Oral  ?SpO2: 96%  ?Weight: 206 lb 2 oz (93.5 kg)  ?Height: _0  (1.676 m)  ? ?Body mass index is 33.27 kg/m?. ? ?Physical Exam ?Vitals reviewed.  ?Constitutional:   ?   Appearance: Normal appearance.  ?HENT:  ?   Head: Normocephalic.  ?   Mouth/Throat:  ?   Mouth: Mucous membranes are moist.  ?   Pharynx: Oropharynx is clear.  ?Eyes:  ?   Extraocular Movements: Extraocular movements intact.  ?   Conjunctiva/sclera: Conjunctivae normal.  ?   Pupils: Pupils are equal, round, and reactive to light.  ?Cardiovascular:  ?   Rate and Rhythm:  Normal rate and regular rhythm.  ?   Pulses: Normal pulses.  ?   Heart sounds: Normal heart sounds.  ?Pulmonary:  ?   Effort: Pulmonary effort is normal.  ?   Breath sounds: Normal breath sounds.  ?Abdominal:  ?   General: Bowel sounds are normal. There is no distension.  ?   Palpations: Abdomen is soft.  ?   Tenderness: There is no abdominal tenderness.  ?Musculoskeletal:     ?   General: Normal range of motion.  ?   Cervical back: No tenderness.  ?Lymphadenopathy:  ?  Cervical: No cervical adenopathy.  ?Skin: ?   General: Skin is warm and dry.  ?   Capillary Refill: Capillary refill takes less than 2 seconds.  ?Neurological:  ?   General: No focal deficit present.  ?   Mental Status: He is alert and oriented to person, place, and time.  ?Psychiatric:     ?   Mood and Affect: Mood normal.     ?   Behavior: Behavior normal.  ? ? ? ?ASSESSMENT & PLAN: ?Clinically stable.  No red flag signs or symptoms.  Stable vital signs. ?Not taking any new medications.  Condition running its course. ?BRAT diet discussed. ?Advised to contact the office if no better or worse during the next several days. ? ?A total of 32 minutes was spent with the patient and counseling/coordination of care regarding preparing for this visit, review of most recent urgent care visit note, review of most recent blood work results, diagnosis of gastroenteritis and its treatment, education on nutrition and importance of staying well-hydrated, prognosis, documentation and need for follow-up. ? ?Problem List Items Addressed This Visit   ? ?  ? Endocrine  ? Hyperlipidemia associated with type 2 diabetes mellitus ([place])  ?  ? Other  ? HIV disease ([place])  ? ?Other Visit Diagnoses   ? ? Acute gastroenteritis    -  Primary  ? Resolving  ? ?  ? ?Patient Instructions  ?Food Choices to Help Relieve Diarrhea, Adult ?Diarrhea can make you feel weak and cause you to become dehydrated. It is important to choose the right foods and drinks to: ?Relieve  diarrhea. ?Replace lost fluids and nutrients. ?Prevent dehydration. ?What are tips for following this plan? ?Relieving diarrhea ?Avoid foods that make your diarrhea worse. These may include: ?Foods and beverages sweet

## 2021-10-21 ENCOUNTER — Other Ambulatory Visit: Payer: 59

## 2021-10-21 ENCOUNTER — Encounter (HOSPITAL_COMMUNITY): Payer: Self-pay | Admitting: Emergency Medicine

## 2021-10-21 ENCOUNTER — Ambulatory Visit (HOSPITAL_COMMUNITY)
Admission: EM | Admit: 2021-10-21 | Discharge: 2021-10-21 | Disposition: A | Discharge status: Home / Self Care | Payer: 59 | Attending: Physician Assistant | Admitting: Physician Assistant

## 2021-10-21 DIAGNOSIS — R197 Diarrhea, unspecified: Secondary | ICD-10-CM

## 2021-10-21 DIAGNOSIS — R112 Nausea with vomiting, unspecified: Secondary | ICD-10-CM

## 2021-10-21 DIAGNOSIS — R1084 Generalized abdominal pain: Secondary | ICD-10-CM

## 2021-10-21 DIAGNOSIS — R1110 Vomiting, unspecified: Secondary | ICD-10-CM

## 2021-10-21 DIAGNOSIS — R110 Nausea: Secondary | ICD-10-CM

## 2021-10-21 MED ORDER — ONDANSETRON 4 MG PO TBDP: 4.0000 mg | ORAL_TABLET | Freq: Three times a day (TID) | ORAL | 0 refills | Status: AC | PRN

## 2021-10-21 MED ORDER — ONDANSETRON HCL 4 MG/2ML IJ SOLN
4.0000 mg | Freq: Once | INTRAMUSCULAR | Status: AC
  Administered 2021-10-21: 4 mg via INTRAMUSCULAR

## 2021-10-21 MED ORDER — ONDANSETRON HCL 4 MG/2ML IJ SOLN
INTRAMUSCULAR | Status: AC
  Filled 2021-10-21: qty 2

## 2021-10-21 NOTE — ED Provider Notes (Signed)
[place]    CSN: [id number] Arrival date & time: 10/21/21  1552      History   Chief Complaint Chief Complaint  Patient presents with   Fever   Nausea   Diarrhea    HPI Brett Walsh is a 56 y.o. male.   56 year old male presents with nausea, vomiting, and diarrhea.  Patient relates this morning he woke up with abdominal pain, diffuse, with nausea and vomiting.  Patient relates that he has thrown up multiple times since this morning, and then started having diarrhea, which has been loose, watery, and frequent.  Patient relates he did have a similar symptoms about 2 weeks ago and was taking medication and the symptoms resolved.  He does relate that he did have some mild fever this morning but since this has resolved.  Patient relates he is trying to take in fluids however he has not able to keep anything down.  He relates that he is "losing everything from both hand ends".  Patient indicates he has not been around any friends or family with similar type symptoms.  Patient indicates he has not eaten any foods that did not taste right.  Patient does relate he is having fatigue since his symptoms started.  Patient relates there is no blood in the diarrhea.  Patient has been advised that his blood pressure is low, his pulse is elevated, and he has not been instructed that he is dehydrated secondary to the amount of throwing up and diarrhea.  Patient has been advised to make sure to take in clear liquids, children's Pedialyte, small amounts frequently over the next 24 hours.     Fever Associated symptoms: diarrhea, nausea and vomiting   Diarrhea Associated symptoms: fever and vomiting    Past Medical History:  Diagnosis Date   Cataract    Deviated septum    Diabetes mellitus without complication ([place])    Gallstones 10/2018   HIV disease [facility])     Patient Active Problem List   Diagnosis Date Noted   Stage 3a chronic kidney disease ([place]) 04/22/2021   Mixed  hyperlipidemia 06/11/2018   Morbid obesity ([place]) 02/02/2018   Hyperlipidemia associated with type 2 diabetes mellitus ([place]) 02/02/2018   GERD (gastroesophageal reflux disease) 12/02/2016   Depression (em[place]) 07/23/2016   Insomnia 07/23/2016   TMJ (temporomandibular joint syndrome) 01/21/2016   Slow transit constipation 08/16/2014   HIV disease ([place]) 06/20/2014   Diabetes type 2, uncontrolled 06/20/2014    Past Surgical History:  Procedure Laterality Date   [place], [id number]       Home Medications    Prior to Admission medications   Medication Sig Start Date End Date Taking? Authorizing Provider  Blood Glucose Monitoring Suppl (ONE TOUCH ULTRA MINI) w/Device KIT Use meter to check blood sugars 1-4 times daily as instructed. 10/14/16   Golden Circle, FNP  canagliflozin (INVOKANA) 300 MG TABS tablet TAKE 1 TABLET(300 MG) BY MOUTH DAILY BEFORE BREAKFAST 09/23/21   Elayne Snare, MD  Continuous Blood Gluc Sensor (FREESTYLE LIBRE 2 SENSOR) MISC 2 Devices by Does not apply route every 14 (fourteen) days. 12/13/20   Elayne Snare, MD  fluticasone (FLONASE) 50 MCG/ACT nasal spray Place 2 sprays into both nostrils daily. 03/20/21   Hassell Done, Mary-Margaret, FNP  glucose blood (FREESTYLE PRECISION NEO TEST) test strip TEST BLOOS SUGARS TWICE DAILY 03/28/21   Elayne Snare, MD  Lancets Misc. (ONE TOUCH SURESOFT) MISC Use 1 lancet per test. Test blood sugars 1-4  times per day as instructed. 10/14/16   Golden Circle, FNP  loratadine (CLARITIN) 10 MG tablet Take 1 tablet (10 mg total) by mouth daily as needed for allergies. 08/03/18   Marrian Salvage, FNP  ondansetron (ZOFRAN ODT) 4 MG disintegrating tablet Take 1 tablet (4 mg total) by mouth every 8 (eight) hours as needed for nausea or vomiting. 10/21/21   Nyoka Lint, PA-C  repaglinide (PRANDIN) 1 MG tablet TAKE 1 TABLET(1 MG) BY MOUTH DAILY BEFORE SUPPER 08/23/21   Elayne Snare, MD  rosuvastatin (CRESTOR) 5 MG tablet TAKE 1  TABLET(5 MG) BY MOUTH DAILY 01/27/21   Elayne Snare, MD  Semaglutide, 1 MG/DOSE, (OZEMPIC, 1 MG/DOSE,) 2 MG/1.5ML SOPN Inject 1 mg into the skin once a week. 07/29/21   Elayne Snare, MD  [place] 600-50-300 MG tablet TAKE 1 TABLET BY MOUTH DAILY 05/30/21   Campbell Riches, MD    Family History Family History  Problem Relation Age of Onset   Diabetes Brother    Diabetes Mother    Diabetes Father    Heart failure Father    Diabetes Sister    Colon cancer Neg Hx    Esophageal cancer Neg Hx    Rectal cancer Neg Hx    Stomach cancer Neg Hx     Social History Social History   Tobacco Use   Smoking status: Light Smoker    Years: 36.00    Types: Cigarettes, E-cigarettes   Smokeless tobacco: Former   Tobacco comments:    3-4/day  Vaping Use   Vaping Use: Former  Substance Use Topics   Alcohol use: Yes    Alcohol/week: 1.0 standard drink    Types: 1 Standard drinks or equivalent per week    Comment: socially   Drug use: No    Types: Hydromorphone     Allergies   Metformin and related and Protonix [pantoprazole sodium]   Review of Systems Review of Systems  Constitutional:  Positive for fever.  Gastrointestinal:  Positive for diarrhea, nausea and vomiting.    Physical Exam Triage Vital Signs ED Triage Vitals  Enc Vitals Group     BP 10/21/21 1655 92/65     Pulse Rate 10/21/21 1655 (!) 127     Resp 10/21/21 1655 19     Temp 10/21/21 1655 98.9 F (37.2 C)     Temp src --      SpO2 10/21/21 1655 96 %     Weight --      Height --      Head Circumference --      Peak Flow --      Pain Score 10/21/21 1654 5     Pain Loc --      Pain Edu? --      Excl. in [place]? --    No data found.  Updated Vital Signs BP 92/65   Pulse (!) 127   Temp 98.9 F (37.2 C)   Resp 19   SpO2 96%   Visual Acuity Right Eye Distance:   Left Eye Distance:   Bilateral Distance:    Right Eye Near:   Left Eye Near:    Bilateral Near:     Physical Exam Constitutional:       Appearance: Normal appearance.  HENT:     Right Ear: Tympanic membrane and ear canal normal.     Left Ear: Tympanic membrane and ear canal normal.     Mouth/Throat:     Mouth: Mucous membranes are  moist.     Pharynx: Oropharynx is clear.  Cardiovascular:     Rate and Rhythm: Normal rate and regular rhythm.     Heart sounds: Normal heart sounds.  Pulmonary:     Effort: Pulmonary effort is normal.     Breath sounds: Normal breath sounds and air entry. No wheezing, rhonchi or rales.  Abdominal:     General: Abdomen is flat. Bowel sounds are increased.     Palpations: Abdomen is soft.     Tenderness: There is no abdominal tenderness. There is no guarding or rebound.  Lymphadenopathy:     Cervical: No cervical adenopathy.  Skin:    Comments: Skin: good skin turgor rebound response bilat hands.  Neurological:     Mental Status: He is alert.     UC Treatments / Results  Labs (all labs ordered are listed, but only abnormal results are displayed) Labs Reviewed - No data to display  EKG   Radiology No results found.  Procedures Procedures (including critical care time)  Medications Ordered in UC Medications  ondansetron (ZOFRAN) injection 4 mg (has no administration in time range)    Initial Impression / Assessment and Plan / UC Course  I have reviewed the triage vital signs and the nursing notes.  Pertinent labs & imaging results that were available during my care of the patient were reviewed by me and considered in my medical decision making (see chart for details).    Plan: 1.  Patient advised to take the Zofran 4 mg 1 every 6-8 hours as needed for nausea and vomiting. 2.  Patient advised to increase his fluid intake with clear liquids, children's Pedialyte, mild amounts frequently for the next 24 hours. 3.  Advised bland diet. 4.  Patient advised if symptoms fail to improve in the next 24 to 48 hours then he is to return to urgent care for reevaluation or to the  emergency room. Final Clinical Impressions(s) / UC Diagnoses   Final diagnoses:  Nausea  Diarrhea, unspecified type  Vomiting, unspecified vomiting type, unspecified whether nausea present  Generalized abdominal pain     Discharge Instructions      Advised to increase fluid intake, clear liquids frequently for the next 24 hours, bland diet for the next 48 hours. Advised to watch glucose level while the stomach virus improves.    ED Prescriptions     Medication Sig Dispense Auth. Provider   ondansetron (ZOFRAN ODT) 4 MG disintegrating tablet Take 1 tablet (4 mg total) by mouth every 8 (eight) hours as needed for nausea or vomiting. 20 tablet Nyoka Lint, PA-C      PDMP not reviewed this encounter.   Nyoka Lint, PA-C 10/21/21 1725

## 2021-10-21 NOTE — Discharge Instructions (Addendum)
Advised to increase fluid intake, clear liquids frequently for the next 24 hours, bland diet for the next 48 hours. Advised to watch glucose level while the stomach virus improves.

## 2021-10-21 NOTE — ED Triage Notes (Signed)
Pt is present today with fever, diarrhea, and nausea. Pt sx started this morning

## 2021-10-24 ENCOUNTER — Ambulatory Visit: Payer: 59 | Admitting: Endocrinology

## 2021-11-03 ENCOUNTER — Encounter: Payer: Self-pay | Admitting: Infectious Diseases

## 2021-11-19 ENCOUNTER — Other Ambulatory Visit: Payer: Self-pay | Admitting: Endocrinology

## 2021-12-17 ENCOUNTER — Other Ambulatory Visit: Payer: Self-pay | Admitting: Endocrinology

## 2021-12-17 ENCOUNTER — Other Ambulatory Visit: Payer: Self-pay | Admitting: Infectious Diseases

## 2021-12-17 DIAGNOSIS — B20 Human immunodeficiency virus [HIV] disease: Secondary | ICD-10-CM

## 2021-12-17 DIAGNOSIS — E1165 Type 2 diabetes mellitus with hyperglycemia: Secondary | ICD-10-CM

## 2021-12-30 ENCOUNTER — Other Ambulatory Visit: Payer: Self-pay

## 2021-12-30 DIAGNOSIS — E1165 Type 2 diabetes mellitus with hyperglycemia: Secondary | ICD-10-CM

## 2021-12-30 MED ORDER — CANAGLIFLOZIN 300 MG PO TABS: ORAL_TABLET | ORAL | 0 refills | Status: AC

## 2022-01-07 ENCOUNTER — Ambulatory Visit: Payer: 59 | Admitting: Endocrinology

## 2022-05-07 ENCOUNTER — Telehealth: Payer: Self-pay

## 2022-05-07 NOTE — Telephone Encounter (Signed)
Received fax from [facility] requesting all medical records for patient to continue care. Faxed last note, labs, and demographics. Will forward medical release to medical records for hospital and mental health records. [facility] P: [phone] Q:[phone] Juanita Laster, RMA
